# Patient Record
Sex: Female | Born: 1965 | Race: White | Hispanic: No | Smoking: Never smoker
Health system: Southern US, Community
[De-identification: ages and names within clinical notes are randomized; demographics above are authoritative.]

## PROBLEM LIST (undated history)

## (undated) DIAGNOSIS — F32A Depression, unspecified: Secondary | ICD-10-CM

## (undated) DIAGNOSIS — F909 Attention-deficit hyperactivity disorder, unspecified type: Secondary | ICD-10-CM

## (undated) DIAGNOSIS — F329 Major depressive disorder, single episode, unspecified: Secondary | ICD-10-CM

## (undated) DIAGNOSIS — F419 Anxiety disorder, unspecified: Secondary | ICD-10-CM

## (undated) DIAGNOSIS — E785 Hyperlipidemia, unspecified: Secondary | ICD-10-CM

## (undated) DIAGNOSIS — K8020 Calculus of gallbladder without cholecystitis without obstruction: Secondary | ICD-10-CM

## (undated) DIAGNOSIS — G971 Other reaction to spinal and lumbar puncture: Secondary | ICD-10-CM

## (undated) DIAGNOSIS — M545 Low back pain, unspecified: Secondary | ICD-10-CM

## (undated) DIAGNOSIS — G4700 Insomnia, unspecified: Secondary | ICD-10-CM

## (undated) DIAGNOSIS — F411 Generalized anxiety disorder: Secondary | ICD-10-CM

## (undated) DIAGNOSIS — J302 Other seasonal allergic rhinitis: Secondary | ICD-10-CM

## (undated) DIAGNOSIS — K219 Gastro-esophageal reflux disease without esophagitis: Secondary | ICD-10-CM

## (undated) DIAGNOSIS — G8929 Other chronic pain: Secondary | ICD-10-CM

## (undated) DIAGNOSIS — G4733 Obstructive sleep apnea (adult) (pediatric): Secondary | ICD-10-CM

## (undated) DIAGNOSIS — Z8619 Personal history of other infectious and parasitic diseases: Secondary | ICD-10-CM

## (undated) DIAGNOSIS — M1990 Unspecified osteoarthritis, unspecified site: Secondary | ICD-10-CM

## (undated) DIAGNOSIS — I10 Essential (primary) hypertension: Secondary | ICD-10-CM

## (undated) HISTORY — DX: Obstructive sleep apnea (adult) (pediatric): G47.33

## (undated) HISTORY — DX: Unspecified osteoarthritis, unspecified site: M19.90

## (undated) HISTORY — DX: Depression, unspecified: F32.A

## (undated) HISTORY — PX: JOINT REPLACEMENT: SHX530

## (undated) HISTORY — DX: Gastro-esophageal reflux disease without esophagitis: K21.9

## (undated) HISTORY — DX: Personal history of other infectious and parasitic diseases: Z86.19

## (undated) HISTORY — DX: Essential (primary) hypertension: I10

## (undated) HISTORY — PX: BACK SURGERY: SHX140

## (undated) HISTORY — DX: Hyperlipidemia, unspecified: E78.5

## (undated) HISTORY — DX: Other seasonal allergic rhinitis: J30.2

## (undated) HISTORY — DX: Major depressive disorder, single episode, unspecified: F32.9

## (undated) HISTORY — DX: Anxiety disorder, unspecified: F41.9

## (undated) HISTORY — DX: Insomnia, unspecified: G47.00

## (undated) HISTORY — DX: Calculus of gallbladder without cholecystitis without obstruction: K80.20

## (undated) HISTORY — PX: INTRAUTERINE DEVICE (IUD) INSERTION: SHX5877

---

## 1996-02-11 HISTORY — PX: WISDOM TOOTH EXTRACTION: SHX21

## 2000-03-17 ENCOUNTER — Other Ambulatory Visit: Admission: RE | Admit: 2000-03-17 | Discharge: 2000-03-17 | Payer: Self-pay | Admitting: Obstetrics and Gynecology

## 2001-01-18 ENCOUNTER — Observation Stay (HOSPITAL_COMMUNITY): Admission: AD | Admit: 2001-01-18 | Discharge: 2001-01-19 | Payer: Self-pay | Admitting: Obstetrics and Gynecology

## 2001-01-19 ENCOUNTER — Encounter: Payer: Self-pay | Admitting: Obstetrics and Gynecology

## 2001-01-21 ENCOUNTER — Inpatient Hospital Stay (HOSPITAL_COMMUNITY): Admission: AD | Admit: 2001-01-21 | Discharge: 2001-01-21 | Payer: Self-pay | Admitting: Obstetrics and Gynecology

## 2001-01-21 ENCOUNTER — Encounter: Payer: Self-pay | Admitting: Obstetrics and Gynecology

## 2001-01-22 ENCOUNTER — Encounter: Payer: Self-pay | Admitting: Obstetrics and Gynecology

## 2001-01-22 ENCOUNTER — Inpatient Hospital Stay (HOSPITAL_COMMUNITY): Admission: AD | Admit: 2001-01-22 | Discharge: 2001-01-22 | Payer: Self-pay | Admitting: Obstetrics and Gynecology

## 2001-01-25 ENCOUNTER — Inpatient Hospital Stay (HOSPITAL_COMMUNITY): Admission: AD | Admit: 2001-01-25 | Discharge: 2001-02-21 | Payer: Self-pay | Admitting: Obstetrics & Gynecology

## 2001-01-25 ENCOUNTER — Encounter (INDEPENDENT_AMBULATORY_CARE_PROVIDER_SITE_OTHER): Payer: Self-pay | Admitting: *Deleted

## 2001-01-26 ENCOUNTER — Encounter: Payer: Self-pay | Admitting: Obstetrics & Gynecology

## 2001-01-30 ENCOUNTER — Encounter: Payer: Self-pay | Admitting: Obstetrics and Gynecology

## 2001-02-01 ENCOUNTER — Encounter: Payer: Self-pay | Admitting: Obstetrics and Gynecology

## 2001-02-04 ENCOUNTER — Encounter: Payer: Self-pay | Admitting: Obstetrics and Gynecology

## 2001-02-08 ENCOUNTER — Encounter: Payer: Self-pay | Admitting: Obstetrics & Gynecology

## 2001-02-16 ENCOUNTER — Encounter: Payer: Self-pay | Admitting: Obstetrics and Gynecology

## 2001-02-22 ENCOUNTER — Encounter: Admission: RE | Admit: 2001-02-22 | Discharge: 2001-03-24 | Payer: Self-pay | Admitting: Obstetrics and Gynecology

## 2001-04-07 ENCOUNTER — Other Ambulatory Visit: Admission: RE | Admit: 2001-04-07 | Discharge: 2001-04-07 | Payer: Self-pay | Admitting: *Deleted

## 2002-05-02 ENCOUNTER — Other Ambulatory Visit: Admission: RE | Admit: 2002-05-02 | Discharge: 2002-05-02 | Payer: Self-pay | Admitting: Obstetrics and Gynecology

## 2003-07-19 ENCOUNTER — Other Ambulatory Visit: Admission: RE | Admit: 2003-07-19 | Discharge: 2003-07-19 | Payer: Self-pay | Admitting: Obstetrics and Gynecology

## 2004-08-26 ENCOUNTER — Other Ambulatory Visit: Admission: RE | Admit: 2004-08-26 | Discharge: 2004-08-26 | Payer: Self-pay | Admitting: Obstetrics and Gynecology

## 2006-01-13 ENCOUNTER — Ambulatory Visit: Payer: Self-pay | Admitting: Cardiology

## 2006-01-22 ENCOUNTER — Ambulatory Visit: Payer: Self-pay

## 2006-01-22 ENCOUNTER — Ambulatory Visit: Payer: Self-pay | Admitting: Cardiology

## 2006-01-22 ENCOUNTER — Encounter: Payer: Self-pay | Admitting: Cardiology

## 2006-02-04 ENCOUNTER — Ambulatory Visit: Payer: Self-pay | Admitting: Cardiology

## 2007-08-12 ENCOUNTER — Ambulatory Visit: Payer: Self-pay | Admitting: Nurse Practitioner

## 2007-09-23 ENCOUNTER — Ambulatory Visit: Payer: Self-pay | Admitting: Nurse Practitioner

## 2008-01-25 ENCOUNTER — Ambulatory Visit: Payer: Self-pay | Admitting: Nurse Practitioner

## 2008-08-01 ENCOUNTER — Ambulatory Visit: Payer: Self-pay | Admitting: Nurse Practitioner

## 2008-09-29 DIAGNOSIS — R51 Headache: Secondary | ICD-10-CM | POA: Insufficient documentation

## 2008-09-29 DIAGNOSIS — R519 Headache, unspecified: Secondary | ICD-10-CM | POA: Insufficient documentation

## 2008-09-29 DIAGNOSIS — E785 Hyperlipidemia, unspecified: Secondary | ICD-10-CM | POA: Insufficient documentation

## 2008-10-04 ENCOUNTER — Ambulatory Visit: Payer: Self-pay | Admitting: Cardiology

## 2008-10-04 DIAGNOSIS — I10 Essential (primary) hypertension: Secondary | ICD-10-CM | POA: Insufficient documentation

## 2008-10-09 ENCOUNTER — Encounter: Payer: Self-pay | Admitting: Cardiology

## 2008-10-10 LAB — CONVERTED CEMR LAB
ALT: 14 units/L (ref 0–35)
AST: 21 units/L (ref 0–37)
Albumin: 4.1 g/dL (ref 3.5–5.2)
Alkaline Phosphatase: 72 units/L (ref 39–117)
BUN: 12 mg/dL (ref 6–23)
Bilirubin, Direct: 0.1 mg/dL (ref 0.0–0.3)
CO2: 27 meq/L (ref 19–32)
Calcium: 9 mg/dL (ref 8.4–10.5)
Chloride: 107 meq/L (ref 96–112)
Cholesterol: 170 mg/dL (ref 0–200)
Creatinine, Ser: 0.8 mg/dL (ref 0.4–1.2)
GFR calc non Af Amer: 83.79 mL/min (ref >60)
Glucose, Bld: 96 mg/dL (ref 70–99)
HDL: 33.1 mg/dL — ABNORMAL LOW (ref >39.00)
LDL Cholesterol: 109 mg/dL — ABNORMAL HIGH (ref 0–99)
Potassium: 4 meq/L (ref 3.5–5.1)
Sodium: 139 meq/L (ref 135–145)
TSH: 0.97 microintl units/mL (ref 0.35–5.50)
Total Bilirubin: 0.8 mg/dL (ref 0.3–1.2)
Total CHOL/HDL Ratio: 5
Total Protein: 7 g/dL (ref 6.0–8.3)
Triglycerides: 138 mg/dL (ref 0.0–149.0)
VLDL: 27.6 mg/dL (ref 0.0–40.0)

## 2008-10-18 ENCOUNTER — Telehealth: Payer: Self-pay | Admitting: Cardiology

## 2008-12-26 ENCOUNTER — Telehealth: Payer: Self-pay | Admitting: Cardiology

## 2009-08-07 ENCOUNTER — Ambulatory Visit: Payer: Self-pay | Admitting: Nurse Practitioner

## 2009-11-22 ENCOUNTER — Ambulatory Visit: Payer: Self-pay | Admitting: Cardiology

## 2009-12-03 ENCOUNTER — Ambulatory Visit: Payer: Self-pay | Admitting: Cardiology

## 2009-12-10 LAB — CONVERTED CEMR LAB
ALT: 13 units/L (ref 0–35)
AST: 17 units/L (ref 0–37)
Albumin: 4.1 g/dL (ref 3.5–5.2)
Alkaline Phosphatase: 68 units/L (ref 39–117)
BUN: 14 mg/dL (ref 6–23)
Bilirubin, Direct: 0.1 mg/dL (ref 0.0–0.3)
CO2: 25 meq/L (ref 19–32)
Calcium: 9 mg/dL (ref 8.4–10.5)
Chloride: 102 meq/L (ref 96–112)
Cholesterol: 196 mg/dL (ref 0–200)
Creatinine, Ser: 0.7 mg/dL (ref 0.4–1.2)
GFR calc non Af Amer: 97.21 mL/min (ref >60)
Glucose, Bld: 83 mg/dL (ref 70–99)
HDL: 49.2 mg/dL (ref >39.00)
LDL Cholesterol: 125 mg/dL — ABNORMAL HIGH (ref 0–99)
Potassium: 4 meq/L (ref 3.5–5.1)
Sodium: 136 meq/L (ref 135–145)
TSH: 0.83 microintl units/mL (ref 0.35–5.50)
Total Bilirubin: 1 mg/dL (ref 0.3–1.2)
Total CHOL/HDL Ratio: 4
Total Protein: 6.8 g/dL (ref 6.0–8.3)
Triglycerides: 108 mg/dL (ref 0.0–149.0)
VLDL: 21.6 mg/dL (ref 0.0–40.0)

## 2010-03-12 NOTE — Assessment & Plan Note (Signed)
Summary: f1y   Visit Type:  1 yr f/u Primary Provider:  NO PCP AT THIS TIME  CC:  pt states she still does not have a PCP told her she could check w/[facility].....chest tightness on and off....denies any other complaints today.  History of Present Illness: Ms Musil returns today for evaluation and management of her hypertension, mild obesity, and mixed hyperlipidemia.  She's lost a total of about 30 pounds. She exercises on a regular basis.   Her complaint today is that she gets a little bit lightheaded sometimes when he stands up too quickly. She denies any syncope.  She's had no chest discomfort consistent with angina or ischemia.  Her lipid panel last year showed a total cholesterol 170, triglycerides 138, HDL 33, LDL 109. Her fasting blood sugar was normal, TSH was normal and LFTs were normal.  Current Medications (verified): 1)  Ambien 10 Mg Tabs (Zolpidem Tartrate) .Marland Kitchen.. 1 Tab At Bedtime 2)  Imitrex 100 Mg Tabs (Sumatriptan Succinate) .... As Needed 3)  Labetalol Hcl 100 Mg Tabs (Labetalol Hcl) .... Take One Tablet By Mouth Twice A Day 4)  Multivitamins   Tabs (Multiple Vitamin) .Marland Kitchen.. 1 Tab Once Daily 5)  Alprazolam 0.5 Mg Tabs (Alprazolam) .... As Needed 6)  Cyclobenzaprine Hcl 10 Mg Tabs (Cyclobenzaprine Hcl) .... 1/2 - 1 Tab As Needed 7)  Fluticasone Propionate 50 Mcg/act Susp (Fluticasone Propionate) .... Use As Directed As Needed 8)  Tums 500 Mg Chew (Calcium Carbonate Antacid) .... As Needed  Allergies (verified): No Known Drug Allergies  Past History:  Past Medical History: Last updated: 10/17/2008 CORONARY ARTERY DISEASE, FAMILY HX (ICD-V17.3) HYPERLIPIDEMIA (ICD-272.4) HEADACHE (ICD-784.0)  Past Surgical History: Last updated: 17-Oct-2008 Cesarean section...2003 Wisdom teeth extracted..1997  Family History: Last updated: 10/17/2008 Father: MI deceased at 66 Mother: alive and smokes Siblings: 1 sister alive and healthy  Social History: Last  updated: 10/17/2008 Married  Regular Exercise - yes Drug Use - no Tobacco Use - Former.  Alcohol Use - yes  Risk Factors: Exercise: yes (10/17/08)  Risk Factors: Smoking Status: quit (10-17-2008)  Review of Systems       negative other than history of present illness  Vital Signs:  Patient profile:   45 year old female Height:      64 inches Weight:      167.8 pounds BMI:     28.91 Pulse rate:   74 / minute Pulse rhythm:   irregular BP sitting:   120 / 70  (left arm) Cuff size:   large  Vitals Entered By: Danielle Rankin, CMA (November 22, 2009 11:47 AM)  Physical Exam  General:  mildly overweight, in no acute distress Head:  normocephalic and atraumatic Eyes:  PERRLA/EOM intact; conjunctiva and lids normal. Neck:  Neck supple, no JVD. No masses, thyromegaly or abnormal cervical nodes. Lungs:  Clear bilaterally to auscultation and percussion. Heart:  PMI not displaced, normal S1-S2, no murmur or gallop. Carotids are full without bruits Abdomen:  Bowel sounds positive; abdomen soft and non-tender without masses, organomegaly, or hernias noted. No hepatosplenomegaly. Msk:  Back normal, normal gait. Muscle strength and tone normal. Pulses:  pulses normal in all 4 extremities Extremities:  No clubbing or cyanosis. Neurologic:  Alert and oriented x 3. Skin:  Intact without lesions or rashes. Psych:  Normal affect.   EKG  Procedure date:  11/22/2009  Findings:      normal sinus rhythm, nonspecific T wave changes,  Impression & Recommendations:  Problem # 1:  HYPERTENSION, BENIGN (ICD-401.1) Assessment Improved  Her updated medication list for this problem includes:    Labetalol Hcl 100 Mg Tabs (Labetalol hcl) .Marland Kitchen... Take one tablet by mouth twice a day  Orders: EKG w/ Interpretation (93000)  Problem # 2:  CORONARY ARTERY DISEASE, FAMILY HX (ICD-V17.3) Assessment: Unchanged  Problem # 3:  HYPERLIPIDEMIA (ICD-272.4) Will repeat fasting lipids. Perhaps her HDL  is increased with her weight loss and exercise. Some of this may just be genetic as I explained to her today.  Patient Instructions: 1)  Your physician recommends that you schedule a follow-up appointment in: 12 months 2)  Your physician recommends that you return for fasting lab work next week. 3)  Your physician recommends that you continue on your current medications as directed. Please refer to the Current Medication list given to you today.

## 2010-04-02 ENCOUNTER — Encounter: Payer: BC Managed Care – PPO | Admitting: Nurse Practitioner

## 2010-04-02 DIAGNOSIS — G43109 Migraine with aura, not intractable, without status migrainosus: Secondary | ICD-10-CM

## 2010-04-02 DIAGNOSIS — M542 Cervicalgia: Secondary | ICD-10-CM

## 2010-04-15 ENCOUNTER — Other Ambulatory Visit: Payer: Self-pay | Admitting: Obstetrics & Gynecology

## 2010-04-15 DIAGNOSIS — M542 Cervicalgia: Secondary | ICD-10-CM

## 2010-04-16 ENCOUNTER — Encounter: Payer: BC Managed Care – PPO | Admitting: Obstetrics and Gynecology

## 2010-04-17 ENCOUNTER — Other Ambulatory Visit: Payer: BC Managed Care – PPO

## 2010-04-17 ENCOUNTER — Encounter (INDEPENDENT_AMBULATORY_CARE_PROVIDER_SITE_OTHER): Payer: Self-pay | Admitting: *Deleted

## 2010-04-17 DIAGNOSIS — I10 Essential (primary) hypertension: Secondary | ICD-10-CM

## 2010-04-17 LAB — CONVERTED CEMR LAB: BUN: 10 mg/dL (ref 6–23)

## 2010-04-22 ENCOUNTER — Ambulatory Visit (HOSPITAL_COMMUNITY)
Admission: RE | Admit: 2010-04-22 | Discharge: 2010-04-22 | Disposition: A | Discharge status: Home / Self Care | Payer: BC Managed Care – PPO | Source: Ambulatory Visit | Attending: Obstetrics & Gynecology | Admitting: Obstetrics & Gynecology

## 2010-04-22 DIAGNOSIS — M542 Cervicalgia: Secondary | ICD-10-CM

## 2010-04-22 DIAGNOSIS — M5020 Other cervical disc displacement, unspecified cervical region: Secondary | ICD-10-CM | POA: Insufficient documentation

## 2010-05-02 ENCOUNTER — Ambulatory Visit (HOSPITAL_COMMUNITY)
Admission: RE | Admit: 2010-05-02 | Discharge: 2010-05-02 | Disposition: A | Discharge status: Home / Self Care | Payer: BC Managed Care – PPO | Source: Ambulatory Visit | Attending: Neurosurgery | Admitting: Neurosurgery

## 2010-05-02 ENCOUNTER — Other Ambulatory Visit (HOSPITAL_COMMUNITY): Payer: Self-pay | Admitting: Neurosurgery

## 2010-05-02 ENCOUNTER — Encounter (HOSPITAL_COMMUNITY)
Admission: RE | Admit: 2010-05-02 | Discharge: 2010-05-02 | Disposition: A | Discharge status: Home / Self Care | Payer: BC Managed Care – PPO | Source: Ambulatory Visit | Attending: Neurosurgery | Admitting: Neurosurgery

## 2010-05-02 DIAGNOSIS — M4712 Other spondylosis with myelopathy, cervical region: Secondary | ICD-10-CM | POA: Insufficient documentation

## 2010-05-02 DIAGNOSIS — Z01818 Encounter for other preprocedural examination: Secondary | ICD-10-CM | POA: Insufficient documentation

## 2010-05-02 DIAGNOSIS — Z01810 Encounter for preprocedural cardiovascular examination: Secondary | ICD-10-CM | POA: Insufficient documentation

## 2010-05-02 DIAGNOSIS — G4730 Sleep apnea, unspecified: Secondary | ICD-10-CM | POA: Insufficient documentation

## 2010-05-02 DIAGNOSIS — I10 Essential (primary) hypertension: Secondary | ICD-10-CM | POA: Insufficient documentation

## 2010-05-02 DIAGNOSIS — Z01812 Encounter for preprocedural laboratory examination: Secondary | ICD-10-CM | POA: Insufficient documentation

## 2010-05-02 LAB — BASIC METABOLIC PANEL
CO2: 28 mEq/L (ref 19–32)
Calcium: 9.3 mg/dL (ref 8.4–10.5)
Creatinine, Ser: 0.8 mg/dL (ref 0.4–1.2)
GFR calc Af Amer: >60 mL/min (ref >60)
GFR calc non Af Amer: >60 mL/min (ref >60)
Glucose, Bld: 82 mg/dL (ref 70–99)

## 2010-05-02 LAB — CBC
Hemoglobin: 13.3 g/dL (ref 12.0–15.0)
MCH: 28.5 pg (ref 26.0–34.0)
MCHC: 33.8 g/dL (ref 30.0–36.0)
RDW: 12.8 % (ref 11.5–15.5)

## 2010-05-02 LAB — SURGICAL PCR SCREEN: MRSA, PCR: NEGATIVE

## 2010-05-03 ENCOUNTER — Ambulatory Visit (HOSPITAL_COMMUNITY)
Admission: RE | Admit: 2010-05-03 | Discharge: 2010-05-04 | DRG: 864 | Disposition: A | Discharge status: Home / Self Care | Payer: BC Managed Care – PPO | Source: Ambulatory Visit | Attending: Neurosurgery | Admitting: Neurosurgery

## 2010-05-03 ENCOUNTER — Ambulatory Visit (HOSPITAL_COMMUNITY): Payer: BC Managed Care – PPO

## 2010-05-03 DIAGNOSIS — M5000 Cervical disc disorder with myelopathy, unspecified cervical region: Secondary | ICD-10-CM | POA: Insufficient documentation

## 2010-05-03 DIAGNOSIS — M4712 Other spondylosis with myelopathy, cervical region: Secondary | ICD-10-CM | POA: Insufficient documentation

## 2010-05-03 DIAGNOSIS — I10 Essential (primary) hypertension: Secondary | ICD-10-CM | POA: Insufficient documentation

## 2010-05-03 DIAGNOSIS — Z01818 Encounter for other preprocedural examination: Secondary | ICD-10-CM | POA: Insufficient documentation

## 2010-05-03 DIAGNOSIS — Z01812 Encounter for preprocedural laboratory examination: Secondary | ICD-10-CM | POA: Insufficient documentation

## 2010-05-03 HISTORY — PX: ANTERIOR CERVICAL DECOMP/DISCECTOMY FUSION: SHX1161

## 2010-05-03 NOTE — Assessment & Plan Note (Unsigned)
NAME:  Shelly Brown, Shelly Brown NO.:  [id number]  MEDICAL RECORD NO.:  [id number]          PATIENT TYPE:  LOCATION:  [facility]           FACILITY:  PHYSICIAN:  Jaynie Collins, MD          DATE OF BIRTH:  DATE OF SERVICE:  04/02/2010                                 CLINIC NOTE  The patient comes to office today for followup on her migraine headaches.  The patient was last seen in June 2011.  Since then, there has been some confusion about her starting the Cymbalta.  Originally, [employer] was not paying for it.  She had been taking Xanax daily.  She has been weaned off that and has been on the Cymbalta for the last 3 weeks at 30 mg.  She has been taking it at night which is causing her to have some mild difficulty with her sleep.  She has also regained some of her weight as she is up 15 pounds.  She has not been exercising much, just 1 day per week doing a knockout class.  She has been going to the chiropractor which has not been particularly helpful. Her neck continues to bother her and has been bothering her for over 1 year.  She originally noticed that the pain started when she was washing her hair 1 day.  She is now feeling that she is having some tingling in her fingers.  She has been taking the Flexeril on a p.r.n. basis only. 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  PHYSICAL EXAMINATION:  GENERAL:  A well-developed, well-nourished 45- year-old Caucasian female, in no acute distress. VITAL SIGNS:  Blood pressure is 119/77, pulse is 82, weight is 181. NEUROLOGIC:  The patient is alert, oriented.  She is appropriate in her affect.  Her speech is appropriate.  She does not have any flight of ideas.  She has good muscle tone and good coordination and good sensation. EXTREMITIES:  Warm and dry.  ASSESSMENT: 1. Migraine headache. 2. Ongoing muscle spasm in her  neck.  PLAN:  We will go up on her dose of Cymbalta to 60 mg.  She is given a prescription for #30 with p.r.n. refills.  We will also order an MRI of her neck.  We discussed her stopping the type of exercise that she is doing and going more toward the water aerobic-type exercise that she might be able to do almost daily.  She is also given lidocaine patches and described at length how to use these.  She is given one box with p.r.n. refills.  She will also have a refill on her Ambien 10 mg one- half tablet p.o. nightly #30 with five refills.     Remonia Richter, NP   ______________________________ Jaynie Collins, MD  LR/MEDQ  D:  04/02/2010  T:  04/03/2010  Job:  [id number]

## 2010-05-08 NOTE — Op Note (Signed)
NAME:  Shelly Brown, Shelly Brown                ACCOUNT NO.:  [id number]  MEDICAL RECORD NO.:  [id number]           PATIENT TYPE:  O  LOCATION:  XRAY                         FACILITY:  [facility]  PHYSICIAN:  Coletta Memos, M.D.     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 cervical radiculopathy, cervical stenosis.  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 cervical radiculopathy, cervical stenosis.  PROCEDURE: 1. C6 corpectomy with microdissection. 2. Arthrodesis C5-C7 with PEEK interbody cage with morselized     autograft. 3. Anterior instrumentation with vector T plating.  COMPLICATIONS:  None.  SURGEON:  Coletta Memos, MD  ASSISTANT:  Clydene Fake, MD  ANESTHESIA:  General endotracheal.  INDICATIONS:  Shelly Brown presented to my office on March 20 with evidence of myelopathy on examination.  MRI showed a large disk herniation at C6-7 which had migrated rostrally behind the body of C6. She had high signal in the spinal cord and severe cervical stenosis.  I recommended strongly to Shelly Brown to get her neck decompressed as soon as possible and she consents to this Friday.  OPERATIVE NOTE:  Shelly Brown was brought to the operating room, intubated and placed under a general anesthetic.  Her head was placed on horseshoe headrest and essentially neutral position.  Her neck was prepped and she was draped in a sterile fashion.  I infiltrated 3 mL, 0.5% lidocaine, 1:200,000 epinephrine and the skin starting from the midline extending to the medial border of the left sternocleidomastoid at the level of the cricothyroid membrane.  I opened the skin with a #10 blade and took this down to the platysma.  I dissected with Metzenbaum  scissors rostrally and caudally in a plane superficial to the platysma.  I opened the platysma in a horizontal fashion.  Using the Metzenbaum scissors, then dissected in a plane inferior to the platysma rostrally and caudally. With both sharp and blunt technique, I created an avascular corridor with the medial straps being reflected medially and sternocleidomastoid, carotid artery, jugular vein being reflected laterally.  I was able to then expose the cervical spine.  I placed spinal needle for localization, took an x-ray, and confirmed our location at C5-6.  I then reflected the longus colli muscles bilaterally placing self-retaining retractors over the disk spaces at C5-6 and C6-7.  I opened both disk spaces.  I decompressed the spinal canal for corpectomy at C6 first by opening the disk spaces at C5-6 and C6-7 with a #15 blade.  I then removed disk material with pituitary rongeurs and curettes.  I used a Gaffer to remove a good portion of the C6 retrieval body and I saved that bone for grafting later.  I also used a drill to complete the corpectomy along with Kerrison punches.  I was able to fully expose the thecal sac and spinal canal from C5-C7 with corpectomy.  I then decompressed the C6 roots and  C7 roots bilaterally using the drill and Kerrison punch with microdissection.  With thorough decompression, I then prepared for the arthrodesis.  With doctor's assistance, we placed a PEEK interbody implant packed with morselized autograft.  I used a 22-mm graft after measuring the space. That felt secure.  I placed anterior instrumentation using a vector T translational plate, placing 2 screws in C5, 2 screws in C7.  This was done with Dr. Doreen Beam assistance.  X-ray showed that we were at the correct level.  I then made sure that there was good hemostasis.  I irrigated copiously.  I closed the wound in layered fashion using Vicryl sutures to reapproximate the platysma and  subcuticular layer.  I used Dermabond for sterile dressing.  Shelly Brown was then extubated moving all extremities well.          ______________________________ Coletta Memos, M.D.     KC/MEDQ  D:  05/03/2010  T:  05/04/2010  Job:  [id number]  Electronically Signed by Coletta Memos M.D. on 05/08/2010 03:45:50 PM

## 2010-06-25 NOTE — Assessment & Plan Note (Signed)
NAME:  Shelly Brown, Shelly Brown NO.:  [id number]   MEDICAL RECORD NO.:  [id number]          PATIENT TYPE:  POB   LOCATION:  [facility]         FACILITY:  [facility]   PHYSICIAN:  Elsie Lincoln, MD      DATE OF BIRTH:  1965/06/22   DATE OF SERVICE:  08/12/2007                                  CLINIC NOTE   The patient comes to the office today for headache consultation.  The  patient is well known to me for many [facility]  She has been seeing Anne Fu there and we will get her  copies of her medical records.  She has I last her seen been diagnosed  with a mild sleep apnea.  She currently feels that she is in a bad  headache place.  She is having approximately 15 days per month with  migraines.  She has approximately 5 severe, 5 moderate, and 5 mild.  She  has not been taking her Imitrex recently.  She has not been taking any  of her prevention.  She has been using Ambien 10 mg up to 20 mg at night  for sleep.  She is currently experiencing some stressors including  working 42 job, a 33-year-old, and husband who is equally busy.   VITAL SIGNS:  Blood pressure is 123/89, weight is 185, and pulse is 73.   ALLERGIES:  None.   CURRENT MEDICATIONS:  Ambien and labetalol.   MENSTRUAL HISTORY:  Regular menstrual cycle.   OBSTETRICAL HISTORY:  G1, P1, last Pap smear was in February 2009.   SURGICAL HISTORY:  C-section only.   PERSONAL HISTORY:  1. Arthritis.  2. High cholesterol.  3. High blood pressure.  4. Migraines.  5. Back pain.   SOCIAL HISTORY:  The patient works outside the home.  She does not smoke  or drink.   REVIEW OF SYSTEMS:  Positive for muscle aches, fatigue, frequent  headaches, problems with breathing, and chest pain.   PHYSICAL EXAMINATION:  GENERAL:  A well-developed and well-nourished 45-  year-old Caucasian female, in no acute distress.  The patient is  overweight.  HEENT:  Head is normocephalic and  atraumatic.  Pupils are equal and  react to light.  CARDIAC:  Regular rate and rhythm.  No murmurs, rubs, or thrills.  LUNGS:  Clear bilaterally.  NEUROLOGIC:  The patient is alert and oriented.  She does not have any  flight of ideas or irregular speech.  Her cranial nerves appear to be  intact.  She has good muscle coordination and good sensation.   ASSESSMENT/PLAN:  Migraine headaches.  We will use Frova 2.5 mg 1 p.o.  q.a.m., #9 with 3 refills to break her headache cycle.  She will also  restart on her Effexor 75 mg XR 1 p.o. daily, #30 with 3 refills as well  as Xanax 0.5 mg 1 p.o. b.i.d., #30 with no refills.  She will use all of  this to help break her current headache cycle.  She will continue on her  Ambien 10 mg 1 p.o. nightly, #30 with 5 refills for sleep at night.  She  is  encouraged not to use 2 at night as that may be contributing to her  headache.  She will have her Imitrex refilled 100 mg 1 p.o. p.r.n.  migraine, #9 with p.r.n. refills to be used once her headache cycle is  broken.  She will return to see me in 6 weeks.      Remonia Richter, NP    ______________________________  Elsie Lincoln, MD    LR/MEDQ  D:  08/12/2007  T:  08/13/2007  Job:  [id number]

## 2010-06-25 NOTE — Assessment & Plan Note (Signed)
NAME:  ROSENDA, Shelly Brown NO.:  [id number]   MEDICAL RECORD NO.:  [id number]          PATIENT TYPE:  POB   LOCATION:  [facility]         FACILITY:  [facility]   PHYSICIAN:  Allie Bossier, MD        DATE OF BIRTH:  10-Sep-1965   DATE OF SERVICE:  08/07/2009                                  CLINIC NOTE   The patient comes to office today for followup on her migraine  headaches.  She was seen at this time last year.  Since then she has  been doing very well.  She has continued to lose weight and is down 20  pounds.  She feels overall that her headaches are better that they are  less migraine now and more tension.  She feels that the tension often  comes from her neck and she would like to have a muscle relaxer.  She  has had low back problems in the past.  She was seen by an orthopedist  in the past.  Now she takes Xanax and that helps.  She did see her  cardiologist.  Her checkup was good.  Her cholesterol was good.  She has  continued to not work a full-time job.  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  She is having some  problems with her allergies.  She has been taking over-the-counter  Allegra-D.  She is willing to see an allergist.   PHYSICAL EXAMINATION:  GENERAL:  Well-developed, well-nourished, 44-year-  old Caucasian female in no acute distress.  VITAL SIGNS:  Blood pressure is 125/85, pulse is 76, weight is 166,  height is 5 feet 4 inches.  HEENT:  Head is normocephalic and atraumatic.  Pupils equal and react.  The patient definitely has nasal congestion and stuffiness.  NEUROLOGICAL:  Alert, oriented.  No neurological deficits.   ASSESSMENT:  1. Migraine.  2. Allergies.  3. Neck pain.   The patient will continue along with her usual migraine medicines.  Her  insurance company pain for her Cymbalta.  She has tapered herself off of  that.  She will see the allergist at [facility] and use Flonase 2 sprays at  bedtime.  She is given a prescription for one bottle with p.r.n.  refills.  She is also given a prescription for Flexeril 10 mg 1/2 to 1  tablet at bedtime #30 with 3 refills.  We may consider physical therapy,  lidocaine patches, and/or MRI if things do not improve.  She will call  the office if she is having any difficulties, otherwise she will follow  up in 1 year.      Shelly Richter, NP    ______________________________  Allie Bossier, MD    LR/MEDQ  D:  08/07/2009  T:  08/08/2009  Job:  [id number]

## 2010-06-25 NOTE — Assessment & Plan Note (Signed)
NAME:  Shelly Brown, Shelly Brown NO.:  [id number]   MEDICAL RECORD NO.:  [id number]          PATIENT TYPE:  POB   LOCATION:  [facility]         [facility]   PHYSICIAN:  Elsie Lincoln, MD      DATE OF BIRTH:  12-02-1965   DATE OF SERVICE:  08/01/2008                                  CLINIC NOTE   The patient comes to the office today for followup on her headaches.  The patient is doing very well with her headaches overall.  She was  given the option of taking a full-time job versus quitting her job and  she has opted to quit her job to keep her stress levels at a minimum.  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  She has  joined the [employer] and has lost 12 pounds and is doing very well with  that.  She is having a particularly bad time with her CPAP machine right  now for her sleep apnea.  She has allergies to GRASS, has really not  been taking her Zyrtec as she should, has not been taking her Rhinocort  at all and as a result her nose is very stuffy and she is unable to use  her CPAP machine now.   PHYSICAL EXAMINATION:  VITAL SIGNS:  Blood pressure is 132/82, pulse is  80, weight is 184, height is 5 feet 4 inches.  GENERAL:  A well-developed, well-nourished 45 year old Caucasian female  in no acute distress.  The patient does have obvious nasal stuffiness.  CARDIAC:  Regular rate and rhythm.  LUNGS:  Clear bilaterally.   ASSESSMENT:  1. Migraine.  2. Insomnia.  3. Sleep apnea.  4. Anxiety.   PLAN:  We have decided to do a prednisone taper, prednisone 20 mg 4 tabs  x1 day, 3 tabs x1 day, 2 tabs x1 day, 1 tab x1 day.  This should take  her back to baseline with her allergies.  The patient is encouraged to  take her Zyrtec daily as well as use her Rhinocort daily and use her  CPAP as best that she can every night.  The patient has continued weight  loss goals.  She would also like to have a refill on her Imitrex 100 mg  1 p.o. p.r.n.  migraine #9 with p.r.n. refills and her Cymbalta 30 mg 1  p.o. daily #30 with p.r.n. refills.  She would also like to have a  refill on her Ambien 10 mg 1 p.o. p.r.n. insomnia, #30 with 5 refills  and her Xanax 0.5 mg 1 p.o. p.r.n. q.6 h. #30 with 1  refill.  The patient is given the option of returning in 6 months or 1  year.  She will choose the 1 year.  In the event that she does not do  well, then she will come back sooner.  Otherwise, we will see her back  in 1 year.      Remonia Richter, NP    ______________________________  Elsie Lincoln, MD    LR/MEDQ  D:  08/01/2008  T:  08/02/2008  Job:  [id number]

## 2010-06-25 NOTE — Assessment & Plan Note (Signed)
NAME:  Shelly Brown, ROTUNNO NO.:  [id number]   MEDICAL RECORD NO.:  [id number]          PATIENT TYPE:  POB   LOCATION:  [facility]         FACILITY:  [facility]   PHYSICIAN:  Argentina Donovan, MD        DATE OF BIRTH:  1965-11-27   DATE OF SERVICE:  01/25/2008                                  CLINIC NOTE   The patient comes to office today for followup on her migraine  headaches.  She is doing extremely well.  Her headaches are down to  approximately 2-3 severe in the last 2 months.  No moderates and a very  few milds.  She is got her job back to part-time, as she feels that this  has been very helpful for her.  She has recognized that she has the  weights as a trigger for her headaches and we have spent some time  talking about that today.  Things at home and work are much better.  In  fact today she is basically just getting her medications refilled.   PHYSICAL EXAMINATION:  VITAL SIGNS:  Blood pressure is 137/89, pulse is  95, weight is 196, height is 5 feet 4.  GENERAL:  Well-developed, well-nourished 45 year old Caucasian female in  no acute distress.  CARDIAC:  Regular rate and rhythm.  LUNGS:  Clear bilaterally.   ASSESSMENT:  Migraine headache, anxiety, and insomnia.   PLAN:  The patient will have her Imitrex refilled 100 mg 1 p.o. p.r.n.  migraine #27 with 1 refill.  She does take occasional Xanax 0.5 mg 1  p.o. p.r.n. #30 with 1 refill.  She can have her Ambien refilled 10 mg 1  p.o. at bedtime p.r.n. #30 with 5 refills.  We did have a lengthy  discussion about losing some weight.  She has in fact going to gym  yesterday and has a goal to lose 10 pounds for her next office visit.      Remonia Richter, NP    ______________________________  Argentina Donovan, MD    LR/MEDQ  D:  01/25/2008  T:  01/26/2008  Job:  [id number]

## 2010-06-25 NOTE — Assessment & Plan Note (Signed)
NAME:  Shelly Brown, Shelly Brown NO.:  [id number]   MEDICAL RECORD NO.:  [id number]          PATIENT TYPE:  POB   LOCATION:  [facility]         [facility]ACILITY:  [facility]   PHYSICIAN:  Elsie Lincoln, MD      DATE OF BIRTH:  30-Oct-1965   DATE OF SERVICE:  09/23/2007                                  CLINIC NOTE   The patient comes to the office today for followup on her migraine  headaches.  She at her last visit was in a very bad headache cycle, and  our intention was to break her headache cycle.  Her insurance company  would not allow her to use the Effexor, so we did change that to  Cymbalta.  She has currently been on Cymbalta 30 mg for approximately 1  month.  She does feel that that has been helpful.  She does feel that  her headache cycle has been broken by several things that we did and  that included further taper the Cymbalta.  She also used the Xanax  almost daily for 1 month.  She also changed some life events including  dropping her job to part time.  All of those things have been helpful  for her.  Last month, she was down to 3 severe headaches, 2 moderates,  and 1 mild.  She did take the Imitrex and felt that that was helpful.  We did discuss sleep hygiene again today and she has been taking the  Ambien at 10 mg.  She still has some difficulty with sleep.  We did  discuss going up on the Cymbalta and she feels that that would be  helpful for her.   PHYSICAL EXAMINATION:  GENERAL:  Well-developed, well-nourished 45-year-  old Caucasian female in no acute distress.  VITAL SIGNS:  Blood pressure is 127/78, pulse is 68, and weight is  183.5.  HEENT:  Head is normocephalic and atraumatic.  Pupils are equal and  react.  NEURO:  The patient is alert and oriented.  Normal muscle coordination  and sensation.  CARDIAC:  Regular rate and rhythm.  LUNGS:  Clear bilaterally.   ASSESSMENT AND PLAN:  1. Migraine.  2. Anxiety.  3. Insomnia.   PLAN:  We will go up on  her Cymbalta to 60 mg 1 p.o. daily, #90 with 2  refills.  She is also requesting a refill on her Xanax.  We will refill  her Xanax 0.5 mg 1 p.o. p.r.n. anxiety, #30 with 1 refill.  The patient  is aware that this must last her for the next 6 months until she  returns.  She is doing a very good job and has been applauded for taking  control of her life and decreasing her stressors.  We will see her back  in the office in 6 months.     Remonia Richter, NP    ______________________________  Elsie Lincoln, MD   LR/MEDQ  D:  09/23/2007  T:  09/23/2007  Job:  [id number]

## 2010-06-28 NOTE — Assessment & Plan Note (Signed)
[facility]                            CARDIOLOGY OFFICE NOTE   NAME:CLONTZChenel, Wernli                         MRN:          [id number]  DATE:01/13/2006                            DOB:          Aug 04, 1965    CHIEF COMPLAINT:  My father had a heart attack in his early 31s.   HISTORY OF PRESENT ILLNESS:  Ms. Taiz Bickle is a delightful 45-year-  old married white female who has a family history of coronary disease.  Her father had an MI had age 23 and died. He did smoke but he quit prior  to that. She does not know about any of his other risk factors. Her  mother is 32 and smokes and is without cardiovascular disease. She has  one sister 56 who is healthy and without disease.   She does have cardiac risk factors of hypertension. This was diagnosed  about 5 years ago when she was preeclamptic. She was on atenolol before  that and now is on labetalol with good control. She checks it with a  home cuff.   She does not smoke, does not binge drink, does not use any recreational  drugs, has no history of glucose intolerance. She had her cholesterol  checked about a year ago and was told that it was borderline high. She  does not know the numbers.   She does walk but has a lot of problems with her knees. She is only  walking about an hour week.   PAST MEDICAL HISTORY:  In addition to the above, she has no known drug  allergies. She has no dye allergy.   MEDICATIONS:  1. Ambien 10 mg p.o. q.h.s.  2. Imitrex 100 mg p.r.n. for migraines.  3. Vicodin 7.5/750 p.r.n. for migraines.  4. Topamax 200 mg daily.  5. Effexor XL 75 mg a day.  6. Tizanidine 4 mg p.r.n.  7. Labetalol 100 mg daily.   PAST SURGICAL HISTORY:  She had a C section in 2003. She had her wisdom  teeth out in 1997.   FAMILY HISTORY:  Above.   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  She is married and has 1 child.   REVIEW OF SYSTEMS:  Negative other than  HPI for anything pertinent.  Specifically, she has no orthopnea, PND, peripheral edema. She does have  some mild dyspnea on exertion but thinks this is due to deconditioning.  She has had no chest pain or ischemic symptoms.   PHYSICAL EXAMINATION:  GENERAL:  She is very pleasant.  SKIN:  Warm and dry. Intact.  VITAL SIGNS:  She is 5 foot 4, weighs 172 pounds. Her blood pressure is  100/70, her pulse is 75 and regular.  HEENT:  Normocephalic, atraumatic. PERRLA. Extraocular movements intact.  Sclera clear. Facial symmetry is normal, dentition is satisfactory.  Carotid upstrokes are equal and bilateral without bruits. No JVD,  thyroid is not enlarged, trachea is midline.  LUNGS:  Clear to auscultation.  HEART:  Reveals a normal S1, S2 with a nondisplaced PMI. She has  no  murmurs, rubs or gallops.  ABDOMEN:  Soft with good bowel sounds. No midline bruit. There is no  hepatomegaly.  EXTREMITIES:  Reveal no cyanosis, clubbing or edema. Pulses are intact.  NEUROLOGIC:  Intact.  MUSCULOSKELETAL:  Intact.   EKG is normal except for some nonspecific ST segment flattening  laterally.   ASSESSMENT/PLAN:  I spent about 20 minutes talking to Ms. Ribas about  primary risk prevention. Her blood pressure is under excellent control.  She does need to increase her exercise to something her knees will  tolerate for about 3 hours per week. In addition, I have asked her to  return for a fasting lipid panel and liver panel. Will also a do a 2-D  echo that day to assess for any left ventricular hypertrophy or left  ventricular dysfunction with her dyspnea on exertion.   Assuming this is all stable, will plan on seeing her back on a p.r.n.  basis.     Thomas C. Daleen Squibb, MD, [facility]  Electronically Signed    TCW/MedQ  DD: 01/13/2006  DT: 01/14/2006  Job #: [id number]   cc:   Juluis Mire, M.D.

## 2010-06-28 NOTE — Op Note (Signed)
[facility]  Patient:    Shelly Brown, Shelly Brown Visit Number: [id number] MRN: [id number]          Service Type: OBS Location: 9400 [id number] Attending Physician:  Minette Headland Dictated by:   Juluis Mire, M.D. Proc. Date: 02/17/01 Admit Date:  01/25/2001                             Operative Report  PREOPERATIVE DIAGNOSES:       Intrauterine pregnancy at 34 weeks with severe toxemia, mature fetal pulmonary studies by amniocentesis, desires primary cesarean section.  POSTOPERATIVE DIAGNOSES:      Intrauterine pregnancy at 34 weeks with severe toxemia, mature fetal pulmonary studies by amniocentesis, desires primary cesarean section.  PROCEDURE:                    Primary low transverse cesarean section.  SURGEON:                      Juluis Mire, M.D.  ANESTHESIA:                   Spinal.  ESTIMATED BLOOD LOSS:         800 cc.  PACKS AND DRAINS:             None.  INTRAOPERATIVE BLOOD:         None.  COMPLICATIONS:                None.  INDICATIONS:                  A 45 year old primigravida married white female was admitted to the hospital on December 16 at which time she was approximately 31 weeks.  The patient had some mildly elevated blood pressures in the office.  Subsequently, they started to increase with increasing proteinuria.  Patient had been on labetalol for this.  We felt that she probably had a chronic hypertension with superimposed preeclampsia and was admitted.  During her hospitalization she had excessive proteinuria.  There were no other signs of worsening toxemia.  Because of the proteinuria she was maintained on hospital monitoring.  At 34 weeks with did an amniocentesis with LS of 2.9:1 with PG.  We had offered a trial of labor through induction which she declined.  Desires primary cesarean section.  The risks of surgery were discussed including the risks of infection, the risk of hemorrhage that  could necessitate transfusion with the risk of AIDS or hepatitis, risk of injury to adjacent organs including bladder, bowel, ureters that could require further exploratory surgery, the risk of deep venous thrombosis and pulmonary embolus. Patient expressed understanding of indications and risks and the higher risk of cesarean section versus vaginal delivery.  PROCEDURE:                    Patient taken to the OR and placed in supine position with left lateral tilt.  After a satisfactory level of spinal anesthesia was obtained the abdomen was prepped out with Betadine and draped as a sterile field.  A low transverse skin incision was made with the knife, carried through subcutaneous tissue.  The anterior rectus fascia was entered sharply and incision of the fascia extended laterally.  The fascia was taken off the muscles superiorly and inferiorly.  Rectus muscles were separated in the midline.  The anterior peritoneum was entered  sharply.  Incision of peritoneum extended both superiorly and inferiorly.  Low transverse bladder flap was developed.  Low transverse uterine incision was begun with the knife and extended laterally using manual traction.  The infant presented in the vertex presentation ______ elevation of head and fundal pressure.  The infant was a viable female who weighed 5 pounds.  Apgars were 8/9.  Umbilical artery pH 7.30.  Placenta was then delivered manually.  Uterus was wiped free of remaining membranes and placenta.  Uterus was exteriorized for closure.  Tubes and ovaries were unremarkable.  Uterus was closed in a running locking suture of 0 chromic using two layer closure technique.  We had good hemostasis. Uterus was returned to the abdominal cavity.  Pelvic cavity was thoroughly irrigated.  Hemostasis was excellent.  Urine output remained clear and adequate.  Muscles reapproximated with running suture 3-0 Vicryl.  Fascia closed with running suture 0 PDS.  Skin was  closed with staples and Steri-Strips.  Sponge, instrument, and needle count reported as correct by circulated nurse x2.  Foley catheter remained clear at time of closure. Patient tolerated procedure well.  Was returned to recovery room in good condition. Dictated by:   Juluis Mire, M.D. Attending Physician:  Minette Headland DD:  02/17/01 TD:  02/17/01 Job: [id number]

## 2010-06-28 NOTE — Assessment & Plan Note (Signed)
[facility]                            CARDIOLOGY OFFICE NOTE   NAME:CLONTZMallory, Schaad                         MRN:          [id number]  DATE:02/04/2006                            DOB:          [birth year]    Ms. Hawbaker returns today to discuss the findings of her studies, and  also talk about statin therapy for hyperlipidemia.   She is only 45 years of age, though she has premature coronary artery  disease history in her family, and she has hypertension for the last 5  years.  Her total cholesterol was 202.  Triglycerides were 99.  HDL was  38.6.  LDL was 151.5.  LFTs were normal, as was her metabolic panel.   Echocardiogram was normal, showing no evidence of LVH.  Her ejection  fraction is normal.   I spent 20 minutes talking about risk factor modification with Ms.  Zeiser.  I clearly put her in the 5% to 10% chance of having a coronary  event in the next 10 years despite her young age.  We talked about  reducing the risk of plaque progression and also soft vulnerable plaque.   After a long discussion, we started her on Lipitor 10 mg a day.  With  her on Effexor and some of her other meds, I wanted the cleanest drug I  could use, hence, not a generic such as lovastatin or simvastatin.   She will come back for fasting lipids and LFTs in 6 weeks.   We also talked about TLC for increasing her HDL.  This includes  exercise, losing weight as well as diet therapy, which we reviewed at  length.     Thomas C. Daleen Squibb, MD, [facility]  Electronically Signed    TCW/MedQ  DD: 02/04/2006  DT: 02/04/2006  Job #: [id number]   cc:   Juluis Mire, M.D.

## 2010-06-28 NOTE — Discharge Summary (Signed)
[facility]  Patient:    Shelly Brown, Shelly Brown Visit Number: [id number] MRN: [id number]          Service Type: OBS Location: 9400 [id number] 01 Attending Physician:  Minette Headland Dictated by:   Danie Chandler, R.N. Admit Date:  01/25/2001 Discharge Date: 02/21/2001                             Discharge Summary  ADMISSION DIAGNOSES:          1. Intrauterine pregnancy at [redacted] weeks gestation.                               2. Chronic hypertension with superimposed                                  preeclampsia.  DISCHARGE DIAGNOSES:          1. Intrauterine pregnancy at [redacted] weeks gestation                                  with severe toxemia.                               2. Mature fetal pulmonary studies by                                  amniocentesis.                               3. Desires primary cesarean section.                               4. Endometritis.  PROCEDURE:                    On February 17, 2001, primary low transverse cesarean section.  REASON FOR ADMISSION:         The patient is a 45 year old primigravida married white female who was admitted to the hospital on December 16, at which time she was approximately 31 weeks.  The patient had some mildly elevated blood pressures in the office.  Subsequently, they started to increase with increasing proteinuria.  The patient had been on labetolol for this.  It was felt that she probably had a chronic hypertension with superimposed preeclampsia and was admitted.  During the hospitalization, the patient had excessive proteinuria.  There were no other signs of worsening toxemia. Because of the proteinuria, she was maintained on hospital monitoring.  At 34 weeks, an amniocentesis was performed with an LS of 2.9 to 1 with PG.  An offer for trial of labor through induction was declined by the patient.  She desired primary cesarean section.  HOSPITAL COURSE:              The patient was taken to  the operating room and underwent the above named procedure without complications.  This was productive of a viable female infant with Apgars of 8 at one minute and 9 at five minutes and an arterial  cord pH of 7.30.  Postoperatively on day #1, the patient was on magnesium sulfate.  She was without complaints.  Blood pressure was 123/73 and urine output was 300 cc an hour.  Hemoglobin was 11.9 and platelets 249,000.  Her magnesium sulfate was discontinued following 24 hours and on postoperative day #2 the patient was complaining of migraines for which she had a history of.  She did not have any visual changes.  Blood pressure was 130s/70s to 80s and urine output was 300 to 500 cc an hour.  The patient had been afebrile up until approximately 3 p.m. on this day at which time she had a temperature of 101.8.  [facility] labs were within normal limits.  Uterus was firm, but mildly tender.  The patient was started on Cefotan for probable endometritis.  She was also given IV fluids, Fioricet, and Imitrex as needed for migraines.  On postoperative day #3, the patients migraines were better. Her temperature was 100.5, uterus nontender.  Incision was clean, dry, and intact. She was continued on IV antibiotics and medicines as needed for migraine.  Anesthesia also consulted and the following day which is postoperative day #4, the patient was without complaint. Her vital signs were stable.  Uterus was nontender.  She was doing well and she was afebrile, so she was discharged home on this day.  CONDITION ON DISCHARGE:       Good.  DIET:                         Regular as tolerated.  ACTIVITY:                     No heavy lifting, no driving, no vaginal entry.  FOLLOW-UP:                    She is to follow up in the office in one to two weeks and she is to call for temperature greater than 100 degrees, persistent nausea and vomiting, heavy vaginal bleeding, and/or redness or drainage from the incision  site.  DISCHARGE MEDICATIONS:        1. Tylox #30 one to two p.o. q.4h. p.r.n. pain.                               2. Fioricet one every four hours as needed for                                  headache.                               3. Ibuprofen as needed.                               4. Ceftin 250 mg one twice a day for 7 days.                               5. Labetolol 100 mg twice a day.                               6.  Prenatal vitamins daily. Dictated by:   Danie Chandler, R.N. Attending Physician:  Minette Headland DD:  03/04/01 TD:  03/05/01 Job: [id number]

## 2010-06-28 NOTE — H&P (Signed)
[facility]  Patient:    Shelly Brown, Shelly Brown Visit Number: [id number] MRN: [id number]          Service Type: Attending:  Juluis Mire, M.D. Dictated by:   Juluis Mire, M.D. Adm. Date:  01/25/01                           History and Physical  REASON FOR ADMISSION:         The patient is a 45 year old gravida 1 para 0 married white female, last menstrual period Jun 22, 2000 giving her an estimated date of confinement of February 17 and estimated gestational age of [redacted] weeks.  This is consistent with prior ultrasound and initial exam.  She is admitted at the present time with evidence of worsening toxemia.  HISTORY OF PRESENT ILLNESS:   The patient has had some mildly elevated blood pressures in the office since admission and probably has some mild chronic hypertension.  Last week she was admitted with increasing blood pressures and proteinuria for evaluation.  A 24-hour urine revealed a little over 1000 mg of protein but a normal creatinine clearance; all other lab work was normal. Prior ultrasounds have revealed no evidence of intrauterine growth restriction.  She had a reactive nonstress test today.  She does complain of some headaches, no scotoma to her right upper quadrant plane.  Blood pressure was 138/88.  She is on labetalol 100 mg twice a day; however, she is spilling 3+ protein and therefore will be readmitted for reevaluation of possibly worsening chronic hypertension with superimposed preeclampsia.  ALLERGIES:                    No known drug allergies.  MEDICATIONS:                  Prenatal vitamins and labetalol.  HISTORY:                      For past medical history, family history, and social history please see prenatal records.  PHYSICAL EXAMINATION:  VITAL SIGNS:                  Patients blood pressure is 138/88; the vital signs are stable.  LUNGS:                        Clear.  CARDIOVASCULAR:               Regular rhythm  and rate with a grade 2/6 systolic ejection murmur, no click or gallops.  ABDOMEN:                      Gravid uterus consistent with dates, nontender. Fetal heart tones are audible.  PELVIC:                       Not performed.  EXTREMITIES:                  Deep tendon reflexes 2-3+, no clonus.  Does have 3+ pitting edema.  IMPRESSION:                   1. Intrauterine pregnancy at 31 weeks.  2. Chronic hypertension with superimposed                                  preeclampsia.  PLAN:                         Patient will be readmitted for toxemic workup. Please see orders. Dictated by:   Juluis Mire, M.D. Attending:  Juluis Mire, M.D. DD:  01/25/01 TD:  01/25/01 Job: [id number]

## 2010-07-12 ENCOUNTER — Other Ambulatory Visit: Payer: Self-pay | Admitting: Cardiology

## 2010-09-24 ENCOUNTER — Ambulatory Visit (INDEPENDENT_AMBULATORY_CARE_PROVIDER_SITE_OTHER): Payer: BC Managed Care – PPO | Admitting: Nurse Practitioner

## 2010-09-24 ENCOUNTER — Encounter: Payer: Self-pay | Admitting: Nurse Practitioner

## 2010-09-24 DIAGNOSIS — G43109 Migraine with aura, not intractable, without status migrainosus: Secondary | ICD-10-CM

## 2010-09-24 NOTE — Progress Notes (Signed)
  Subjective:    Patient ID: Shelly Brown, female    DOB: 1965/03/10, 45 y.o.   MRN: [id number]  HPI Pt had surgery for cervical disc repair in March 2012. Had been doing very well until about 1 month ago. At that point she had come off all pain meds and had resumed her presurgery activity. She has been having a daily migraine for the last month and severe one in last few days. She has taken Imitrex and vicoden yesterday with some relief. Today headache is back. In the last month she has returned to surgeon and he has repeated MRI  And weaned her off all pain meds.     Review of Systems     Objective:   Physical Exam  Alert, anxious female in NAD      Assessment & Plan:  A: Migraine Headache ongoing/ likely to withdrawal of vicoden P: Prednisone taper start today. Rx for prednisone 20mg  4 tabs x1 day, 3 tabs x 1 day, 2 tabs x1 day and 1 tab x 1 day. Hopefully this will break headache cycle for her. Add second preventive Topamax 25mg  qhs x1 week, then 50mg  qhs x1 week then 75mg  qhs till seen again. Encouraged to start water stretching q1 hour/ day. Discussed activity level and trying to avoid pain medication at this time. RTC 6 weeks or prn

## 2010-09-24 NOTE — Patient Instructions (Signed)
Computer was not working at time of visit and medications were hand written

## 2010-10-22 ENCOUNTER — Telehealth: Payer: Self-pay | Admitting: Nurse Practitioner

## 2010-10-22 NOTE — Telephone Encounter (Signed)
Shelly Brown would like for your to call her regarding the Topamax (new Med) you put her on.  She is still having headaches.

## 2010-10-25 ENCOUNTER — Telehealth: Payer: Self-pay | Admitting: *Deleted

## 2010-10-25 DIAGNOSIS — F419 Anxiety disorder, unspecified: Secondary | ICD-10-CM

## 2010-10-25 DIAGNOSIS — G4700 Insomnia, unspecified: Secondary | ICD-10-CM

## 2010-10-25 MED ORDER — CLONAZEPAM 0.5 MG PO TABS: 0.5000 mg | ORAL_TABLET | Freq: Two times a day (BID) | ORAL | Status: DC | PRN

## 2010-10-25 MED ORDER — ZOLPIDEM TARTRATE 10 MG PO TABS: 10.0000 mg | ORAL_TABLET | Freq: Every evening | ORAL | Status: DC | PRN

## 2010-10-25 MED ORDER — CLONAZEPAM 0.5 MG PO TABS: 0.5000 mg | ORAL_TABLET | Freq: Every day | ORAL | Status: DC | PRN

## 2010-10-25 NOTE — Telephone Encounter (Signed)
Patient wishes to have the medicine Klonopin called in and [place] refilled.  Ok Per Bonita Quin to use Klonopin prn. rx faxed to patients pharmacy.

## 2010-10-29 ENCOUNTER — Encounter: Payer: BC Managed Care – PPO | Admitting: Nurse Practitioner

## 2010-11-05 ENCOUNTER — Ambulatory Visit (INDEPENDENT_AMBULATORY_CARE_PROVIDER_SITE_OTHER): Payer: BC Managed Care – PPO | Admitting: Nurse Practitioner

## 2010-11-05 ENCOUNTER — Encounter: Payer: Self-pay | Admitting: Nurse Practitioner

## 2010-11-05 VITALS — BP 125/80 | HR 69 | Wt 185.0 lb

## 2010-11-05 DIAGNOSIS — G43909 Migraine, unspecified, not intractable, without status migrainosus: Secondary | ICD-10-CM

## 2010-11-05 DIAGNOSIS — M62838 Other muscle spasm: Secondary | ICD-10-CM

## 2010-11-05 DIAGNOSIS — Z8669 Personal history of other diseases of the nervous system and sense organs: Secondary | ICD-10-CM | POA: Insufficient documentation

## 2010-11-05 DIAGNOSIS — F411 Generalized anxiety disorder: Secondary | ICD-10-CM

## 2010-11-05 DIAGNOSIS — F419 Anxiety disorder, unspecified: Secondary | ICD-10-CM

## 2010-11-05 MED ORDER — TIZANIDINE HCL 4 MG PO TABS: 4.0000 mg | ORAL_TABLET | Freq: Three times a day (TID) | ORAL | Status: AC | PRN

## 2010-11-05 MED ORDER — DULOXETINE HCL 60 MG PO CPEP: 60.0000 mg | ORAL_CAPSULE | Freq: Every day | ORAL | Status: DC

## 2010-11-05 NOTE — Telephone Encounter (Signed)
Spoke with Shelly Brown today. Has been taking Klonopin daily instead of prn. Admits she has felt best on pain meds, xanax and or klonopin. Advised we would not be able to continue any of these as they are all addictive. Currently on Cymbalta 60mg  and topamax 75mg . Has chronic pain, depression and anxiety. Now feels neck muscles are very tight and that is leading to headache. Not getting that much relief from flexeril.   Today she is offered trigger point injections, zanaflex to replace flexeril and consider botox.

## 2010-11-05 NOTE — Progress Notes (Signed)
S: Pt called in C/O muscle spasm and headache. Had been called in RX for Klonopin to take on prn basis. Has been taking it daily. States she feels very nervous and has sx depression. Currently not working and not exercising. She was advised over phone today that we would no longer call in klonopin and that her options for improvement were today trigger point injections, change flexeril to zanaflex.  O: Pt has tightness in traps bilat and tenderness in occipital nerve regions.   Trigger point Injection : see procedure sheet   A: migraine headache Muscle spasm Anxiety Depression  P: TPIs done today. Flexeril discontinued. Restart zanaflex. Advised pt seek counseling with Dr Caralyn Guile at [facility] asap. Advised exercise, starting with mild walking program. Advised going back to work or school.

## 2010-11-12 ENCOUNTER — Encounter: Payer: Self-pay | Admitting: Cardiology

## 2010-11-12 ENCOUNTER — Ambulatory Visit (INDEPENDENT_AMBULATORY_CARE_PROVIDER_SITE_OTHER): Payer: BC Managed Care – PPO | Admitting: Cardiology

## 2010-11-12 VITALS — BP 100/70 | HR 66 | Ht 64.0 in | Wt 184.0 lb

## 2010-11-12 DIAGNOSIS — I10 Essential (primary) hypertension: Secondary | ICD-10-CM

## 2010-11-12 DIAGNOSIS — E785 Hyperlipidemia, unspecified: Secondary | ICD-10-CM

## 2010-11-12 NOTE — Patient Instructions (Signed)
Your physician recommends that you return for lab work on Thursday October 4,2012  You have been referred to Dr. Dayton Martes at [facility]

## 2010-11-12 NOTE — Assessment & Plan Note (Signed)
Stable

## 2010-11-12 NOTE — Progress Notes (Signed)
HPI [name] returns today for evaluation and management of her hypertension and hyperlipidemia. He is  She denies any chest pain or angina. Her blood pressure in good control. She is overdue blood work.  She does not have a primary care physician. I will obtain her one today.  Her EKG shows normal sinus rhythm with nonspecific T-wave changes.  Past Medical History  Diagnosis Date  . Migraine   . Hypertension   . Anxiety   . Insomnia   . Depression     Past Surgical History  Procedure Date  . Cesarean section   . Wisdom tooth extraction   . Cervical spine surgery 05/03/2010    herniated disc    Family History  Problem Relation Age of Onset  . Heart disease Paternal Grandfather     HEART ATTACK  . Heart disease Father     HEART ATTACK    History   Social History  . Marital Status: Married    Spouse Name: N/A    Number of Children: N/A  . Years of Education: N/A   Occupational History  . Not on file.   Social History Main Topics  . Smoking status: Never Smoker   . Smokeless tobacco: Not on file  . Alcohol Use: Yes     social  . Drug Use: No  . Sexually Active: Yes -- Female partner(s)   Other Topics Concern  . Not on file   Social History Narrative  . No narrative on file    No Known Allergies  Current Outpatient Prescriptions  Medication Sig Dispense Refill  . clonazePAM (KLONOPIN) 0.5 MG tablet Take 1 tablet (0.5 mg total) by mouth daily as needed for anxiety (take one tablet daily as needed).  15 tablet  0  . DULoxetine (CYMBALTA) 60 MG capsule Take 1 capsule (60 mg total) by mouth daily.  30 capsule  6  . labetalol (NORMODYNE) 100 MG tablet TAKE 1 TABLET BY MOUTH TWICE A DAY  180 tablet  2  . SUMAtriptan (IMITREX) 100 MG tablet Take 100 mg by mouth every 2 (two) hours as needed.        Marland Kitchen tiZANidine (ZANAFLEX) 4 MG tablet Take 1 tablet (4 mg total) by mouth every 8 (eight) hours as needed.  90 tablet  1  . topiramate (TOPAMAX) 25 MG capsule Take 25  mg by mouth 3 (three) times daily.        Marland Kitchen zolpidem (AMBIEN) 10 MG tablet Take 1 tablet (10 mg total) by mouth at bedtime as needed.  30 tablet  2    ROS Negative other than HPI.   PE General Appearance: well developed, well nourished in no acute distress HEENT: symmetrical face, PERRLA, good dentition  Neck: no JVD, thyromegaly, or adenopathy, trachea midline Chest: symmetric without deformity Cardiac: PMI non-displaced, RRR, normal S1, S2, no gallop or murmur Lung: clear to ausculation and percussion Vascular: all pulses full without bruits  Abdominal: nondistended, nontender, good bowel sounds, no HSM, no bruits Extremities: no cyanosis, clubbing or edema, no sign of DVT, no varicosities  Skin: normal color, no rashes Neuro: alert and oriented x 3, non-focal Pysch: normal affect Filed Vitals:   11/12/10 0933  BP: 100/70  Pulse: 66  Height: 5\' 4"  (1.626 m)  Weight: 184 lb (83.462 kg)    EKG  Labs and Studies Reviewed.   Lab Results  Component Value Date   WBC 6.8 05/02/2010   HGB 13.3 05/02/2010   HCT 39.3 05/02/2010  MCV 84.2 05/02/2010   PLT 302 05/02/2010      Chemistry      Component Value Date/Time   NA 136 05/02/2010 1144   K 4.1 05/02/2010 1144   CL 103 05/02/2010 1144   CO2 28 05/02/2010 1144   BUN 6 05/02/2010 1144   CREATININE 0.80 05/02/2010 1144      Component Value Date/Time   CALCIUM 9.3 05/02/2010 1144   ALKPHOS 68 12/03/2009 0833   AST 17 12/03/2009 0833   ALT 13 12/03/2009 0833   BILITOT 1.0 12/03/2009 0833       Lab Results  Component Value Date   CHOL 196 12/03/2009   CHOL 170 10/09/2008   Lab Results  Component Value Date   HDL 49.20 12/03/2009   HDL 33.10* 10/09/2008   Lab Results  Component Value Date   LDLCALC 125* 12/03/2009   LDLCALC 109* 10/09/2008   Lab Results  Component Value Date   TRIG 108.0 12/03/2009   TRIG 138.0 10/09/2008   Lab Results  Component Value Date   CHOLHDL 4 12/03/2009   CHOLHDL 5 10/09/2008   No  results found for this basename: HGBA1C   Lab Results  Component Value Date   ALT 13 12/03/2009   AST 17 12/03/2009   ALKPHOS 68 12/03/2009   BILITOT 1.0 12/03/2009   Lab Results  Component Value Date   TSH 0.83 12/03/2009  the Past Medical History  Diagnosis Date  . Migraine   . Hypertension   . Anxiety   . Insomnia   . Depression     Past Surgical History  Procedure Date  . Cesarean section   . Wisdom tooth extraction   . Cervical spine surgery 05/03/2010    herniated disc    Family History  Problem Relation Age of Onset  . Heart disease Paternal Grandfather     HEART ATTACK  . Heart disease Father     HEART ATTACK    History   Social History  . Marital Status: Married    Spouse Name: N/A    Number of Children: N/A  . Years of Education: N/A   Occupational History  . Not on file.   Social History Main Topics  . Smoking status: Never Smoker   . Smokeless tobacco: Not on file  . Alcohol Use: Yes     social  . Drug Use: No  . Sexually Active: Yes -- Female partner(s)   Other Topics Concern  . Not on file   Social History Narrative  . No narrative on file    No Known Allergies  Current Outpatient Prescriptions  Medication Sig Dispense Refill  . clonazePAM (KLONOPIN) 0.5 MG tablet Take 1 tablet (0.5 mg total) by mouth daily as needed for anxiety (take one tablet daily as needed).  15 tablet  0  . DULoxetine (CYMBALTA) 60 MG capsule Take 1 capsule (60 mg total) by mouth daily.  30 capsule  6  . labetalol (NORMODYNE) 100 MG tablet TAKE 1 TABLET BY MOUTH TWICE A DAY  180 tablet  2  . SUMAtriptan (IMITREX) 100 MG tablet Take 100 mg by mouth every 2 (two) hours as needed.        Marland Kitchen tiZANidine (ZANAFLEX) 4 MG tablet Take 1 tablet (4 mg total) by mouth every 8 (eight) hours as needed.  90 tablet  1  . topiramate (TOPAMAX) 25 MG capsule Take 25 mg by mouth 3 (three) times daily.        Marland Kitchen  zolpidem (AMBIEN) 10 MG tablet Take 1 tablet (10 mg total) by mouth at  bedtime as needed.  30 tablet  2    ROS Negative other than HPI.   PE  Filed Vitals:   11/12/10 0933  BP: 100/70  Pulse: 66  Height: 5\' 4"  (1.626 m)  Weight: 184 lb (83.462 kg)    EKG  Labs and Studies Reviewed.   Lab Results  Component Value Date   WBC 6.8 05/02/2010   HGB 13.3 05/02/2010   HCT 39.3 05/02/2010   MCV 84.2 05/02/2010   PLT 302 05/02/2010      Chemistry      Component Value Date/Time   NA 136 05/02/2010 1144   K 4.1 05/02/2010 1144   CL 103 05/02/2010 1144   CO2 28 05/02/2010 1144   BUN 6 05/02/2010 1144   CREATININE 0.80 05/02/2010 1144      Component Value Date/Time   CALCIUM 9.3 05/02/2010 1144   ALKPHOS 68 12/03/2009 0833   AST 17 12/03/2009 0833   ALT 13 12/03/2009 0833   BILITOT 1.0 12/03/2009 0833       Lab Results  Component Value Date   CHOL 196 12/03/2009   CHOL 170 10/09/2008   Lab Results  Component Value Date   HDL 49.20 12/03/2009   HDL 33.10* 10/09/2008   Lab Results  Component Value Date   LDLCALC 125* 12/03/2009   LDLCALC 109* 10/09/2008   Lab Results  Component Value Date   TRIG 108.0 12/03/2009   TRIG 138.0 10/09/2008   Lab Results  Component Value Date   CHOLHDL 4 12/03/2009   CHOLHDL 5 10/09/2008   No results found for this basename: HGBA1C   Lab Results  Component Value Date   ALT 13 12/03/2009   AST 17 12/03/2009   ALKPHOS 68 12/03/2009   BILITOT 1.0 12/03/2009   Lab Results  Component Value Date   TSH 0.83 12/03/2009  one

## 2010-11-12 NOTE — Assessment & Plan Note (Signed)
Fasting lipids and LFTs scheduled this week. Followup with Dr.Aron as her new primary care physician.

## 2010-11-14 ENCOUNTER — Other Ambulatory Visit (INDEPENDENT_AMBULATORY_CARE_PROVIDER_SITE_OTHER): Payer: BC Managed Care – PPO | Admitting: *Deleted

## 2010-11-14 ENCOUNTER — Other Ambulatory Visit: Payer: Self-pay | Admitting: Cardiology

## 2010-11-14 DIAGNOSIS — E785 Hyperlipidemia, unspecified: Secondary | ICD-10-CM

## 2010-11-14 LAB — HEPATIC FUNCTION PANEL
Alkaline Phosphatase: 78 U/L (ref 39–117)
Bilirubin, Direct: 0 mg/dL (ref 0.0–0.3)
Total Bilirubin: 1.2 mg/dL (ref 0.3–1.2)
Total Protein: 7.7 g/dL (ref 6.0–8.3)

## 2010-11-14 LAB — LIPID PANEL
Total CHOL/HDL Ratio: 5
VLDL: 35.8 mg/dL (ref 0.0–40.0)

## 2010-11-27 ENCOUNTER — Encounter: Payer: Self-pay | Admitting: Family Medicine

## 2010-11-27 ENCOUNTER — Ambulatory Visit (INDEPENDENT_AMBULATORY_CARE_PROVIDER_SITE_OTHER): Payer: BC Managed Care – PPO | Admitting: Family Medicine

## 2010-11-27 VITALS — BP 144/98 | HR 72 | Temp 98.5°F | Ht 64.0 in | Wt 175.0 lb

## 2010-11-27 DIAGNOSIS — I10 Essential (primary) hypertension: Secondary | ICD-10-CM

## 2010-11-27 DIAGNOSIS — E785 Hyperlipidemia, unspecified: Secondary | ICD-10-CM

## 2010-11-27 DIAGNOSIS — J309 Allergic rhinitis, unspecified: Secondary | ICD-10-CM

## 2010-11-27 DIAGNOSIS — F419 Anxiety disorder, unspecified: Secondary | ICD-10-CM | POA: Insufficient documentation

## 2010-11-27 DIAGNOSIS — J302 Other seasonal allergic rhinitis: Secondary | ICD-10-CM | POA: Insufficient documentation

## 2010-11-27 DIAGNOSIS — F341 Dysthymic disorder: Secondary | ICD-10-CM

## 2010-11-27 DIAGNOSIS — K219 Gastro-esophageal reflux disease without esophagitis: Secondary | ICD-10-CM | POA: Insufficient documentation

## 2010-11-27 DIAGNOSIS — Z23 Encounter for immunization: Secondary | ICD-10-CM

## 2010-11-27 DIAGNOSIS — F329 Major depressive disorder, single episode, unspecified: Secondary | ICD-10-CM | POA: Insufficient documentation

## 2010-11-27 MED ORDER — OMEPRAZOLE 40 MG PO CPDR: 40.0000 mg | DELAYED_RELEASE_CAPSULE | Freq: Every day | ORAL | Status: DC

## 2010-11-27 NOTE — Assessment & Plan Note (Signed)
On labetalol for this, today elevated. States normally much better controlled. Did recently start stimulant. rec pt keep track of BP at home, notify me if consistently >140/90.  Also to bring log to psych appt in 4-5 wks.

## 2010-11-27 NOTE — Assessment & Plan Note (Signed)
Recently worsening.  Trial of omeprazole 40mg  daily for next several weeks.   If not improving, consider GI referral for EGD vs testing for h pylori. Discussed GERD precautions.

## 2010-11-27 NOTE — Assessment & Plan Note (Signed)
Followed by Dr. Westley Chandler.

## 2010-11-27 NOTE — Progress Notes (Signed)
Subjective:    Patient ID: Shelly Brown, female    DOB: June 02, 1965, 45 y.o.   MRN: [id number]  HPI CC: new pt  bp elevated today - normally on labetalol 100mg  bid.  Pre eclampsia at pregnancy.  No change in HA, denies vision changes, CP/tightness, SOB, leg swelling.   HLD - strong family history of CAD, LDL when checked was 185.  Goal <130.  Looking into getting breast reduction (saw Dr. Stephens November at [facility]).  Anxiety - sees Dr. Westley Chandler, recently started on klonopin 0.5mg  qid, ran out 2 days ago, having trouble filling from pharmacy (2/2 insurance).  GERD - burning mid chest and abd discomfort notices worse after certain foods (caffeine, spicy foods, lettuce).  Has been on zantac over a year.  Takes tums as well.  No fevers/chills, indigestion, early satiety.  Overweight - has actually lost 10 lbs.  Eating less.  Preventative: Blood work done 2 wks ago with Dr Daleen Squibb. Last well woman exam 10/2010 - normal pap smear and mammogram. No recent physical. Flu shot declined.  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  Medications and allergies reviewed and updated in chart.  Past histories reviewed and updated if relevant as below. Patient Active Problem List  Diagnoses  . HYPERLIPIDEMIA  . HYPERTENSION, BENIGN  . HEADACHE  . Migraine   Past Medical History  Diagnosis Date  . Migraine   . Hypertension   . Anxiety and depression     mainly anxiety, Dr. Westley Chandler  . Insomnia   . Arthritis   . GERD (gastroesophageal reflux disease)   . Seasonal allergies   . History of chicken pox    Past Surgical History  Procedure Date  . Cesarean section 2003  . Wisdom tooth extraction 1998  . Cervical spine surgery 05/03/2010    with fusion for herniated disc   History    Substance Use Topics  . Smoking status: Never Smoker   . Smokeless tobacco: Never Used  . Alcohol Use: Yes     social   Family History  Problem Relation Age of Onset  . Coronary artery disease Paternal Grandfather 69    MI  . Arthritis Father   . Coronary artery disease Father 36    MI  . Arthritis Mother   . Arthritis Maternal Grandfather   . Arthritis Maternal Grandmother   . Migraines Maternal Grandmother   . Migraines Sister   . Coronary artery disease Paternal Uncle   . Diabetes Neg Hx   . Stroke Neg Hx   . Cancer Neg Hx    Allergies  Allergen Reactions  . Morphine And Related Other (See Comments)    Hallucinations   Current Outpatient Prescriptions on File Prior to Visit  Medication Sig Dispense Refill  . DULoxetine (CYMBALTA) 60 MG capsule Take 1 capsule (60 mg total) by mouth daily.  30 capsule  6  . labetalol (NORMODYNE) 100 MG tablet TAKE 1 TABLET BY MOUTH TWICE A DAY  180 tablet  2  . SUMAtriptan (IMITREX) 100 MG tablet Take 100 mg by mouth every 2 (two) hours as needed.        . topiramate (TOPAMAX) 25 MG capsule Take 25 mg by mouth 3 (three) times daily.        Marland Kitchen zolpidem (AMBIEN) 10 MG tablet Take  1 tablet (10 mg total) by mouth at bedtime as needed.  30 tablet  2   Review of Systems  Constitutional: Negative for fever, chills, activity change, appetite change, fatigue and unexpected weight change.  HENT: Negative for hearing loss and neck pain.   Eyes: Negative for visual disturbance.  Respiratory: Negative for cough, chest tightness, shortness of breath and wheezing.   Cardiovascular: Negative for chest pain, palpitations and leg swelling.  Gastrointestinal: Negative for nausea, vomiting, abdominal pain, diarrhea, constipation, blood in stool and abdominal distention.  Genitourinary: Negative for hematuria and difficulty urinating.  Musculoskeletal: Negative for myalgias and arthralgias.  Skin: Negative for rash.  Neurological: Negative for dizziness,  seizures, syncope and headaches.  Hematological: Does not bruise/bleed easily.  Psychiatric/Behavioral: Negative for dysphoric mood. The patient is nervous/anxious.       Objective:   Physical Exam  Nursing note and vitals reviewed. Constitutional: She is oriented to person, place, and time. She appears well-developed and well-nourished. No distress.  HENT:  Head: Normocephalic and atraumatic.  Right Ear: External ear normal.  Left Ear: External ear normal.  Nose: Nose normal.  Mouth/Throat: Oropharynx is clear and moist.  Eyes: Conjunctivae and EOM are normal. Pupils are equal, round, and reactive to light.  Neck: Normal range of motion. Neck supple. No thyromegaly present.  Cardiovascular: Normal rate, regular rhythm, normal heart sounds and intact distal pulses.   No murmur heard. Pulses:      Radial pulses are 2+ on the right side, and 2+ on the left side.  Pulmonary/Chest: Effort normal and breath sounds normal. No respiratory distress. She has no wheezes. She has no rales.  Abdominal: Soft. Bowel sounds are normal. She exhibits no distension and no mass. There is no tenderness. There is no rebound and no guarding.  Musculoskeletal: Normal range of motion.  Lymphadenopathy:    She has no cervical adenopathy.  Neurological: She is alert and oriented to person, place, and time.       CN grossly intact, station and gait intact  Skin: Skin is warm and dry. No rash noted.  Psychiatric: She has a normal mood and affect. Her behavior is normal. Judgment and thought content normal.      Assessment & Plan:

## 2010-11-27 NOTE — Assessment & Plan Note (Addendum)
Discussed goal LDL lower given strong family history. Discussed low cholesterol diet, mailed handout to patient. TLC for now, return 6 mo for recheck blood work, weight.

## 2010-11-27 NOTE — Patient Instructions (Signed)
Flu shot today. Keep track of blood pressure at home.  If consistently >140/90, call me and let me know. Return to see me in 6 months for follow up and physical.  Return prior fasting for blood work. For reflux:  Head of bed elevated. Avoidance of citrus, fatty foods, chocolate, peppermint, and excessive alcohol, along with sodas, orange juice (acidic drinks) At least a few hours between dinner and bed, minimize naps after eating. Trial of reflux medicine.  If not helping, please let me know as well.

## 2010-11-29 ENCOUNTER — Encounter: Payer: Self-pay | Admitting: Family Medicine

## 2010-12-12 HISTORY — PX: REDUCTION MAMMAPLASTY: SUR839

## 2010-12-18 ENCOUNTER — Telehealth: Payer: Self-pay | Admitting: Cardiology

## 2010-12-18 NOTE — Telephone Encounter (Signed)
All Cardiac faxed to [facility] @ [phone]  12/18/10/km

## 2010-12-19 ENCOUNTER — Other Ambulatory Visit: Payer: Self-pay | Admitting: Plastic Surgery

## 2011-01-16 ENCOUNTER — Other Ambulatory Visit: Payer: Self-pay | Admitting: *Deleted

## 2011-01-16 MED ORDER — OMEPRAZOLE 40 MG PO CPDR: 40.0000 mg | DELAYED_RELEASE_CAPSULE | Freq: Every day | ORAL | Status: DC

## 2011-01-16 NOTE — Telephone Encounter (Signed)
Patient required 90 day supply.

## 2011-01-29 ENCOUNTER — Other Ambulatory Visit: Payer: Self-pay | Admitting: *Deleted

## 2011-04-05 ENCOUNTER — Other Ambulatory Visit: Payer: Self-pay | Admitting: Cardiology

## 2011-05-21 ENCOUNTER — Other Ambulatory Visit (INDEPENDENT_AMBULATORY_CARE_PROVIDER_SITE_OTHER): Payer: BC Managed Care – PPO

## 2011-05-21 DIAGNOSIS — E785 Hyperlipidemia, unspecified: Secondary | ICD-10-CM

## 2011-05-21 DIAGNOSIS — I10 Essential (primary) hypertension: Secondary | ICD-10-CM

## 2011-05-21 LAB — COMPREHENSIVE METABOLIC PANEL
ALT: 14 U/L (ref 0–35)
AST: 22 U/L (ref 0–37)
Alkaline Phosphatase: 93 U/L (ref 39–117)
Creatinine, Ser: 0.7 mg/dL (ref 0.4–1.2)
Sodium: 144 mEq/L (ref 135–145)
Total Bilirubin: 0.5 mg/dL (ref 0.3–1.2)
Total Protein: 7.1 g/dL (ref 6.0–8.3)

## 2011-05-21 LAB — LIPID PANEL
Cholesterol: 206 mg/dL — ABNORMAL HIGH (ref 0–200)
HDL: 52 mg/dL (ref >39.00)
Total CHOL/HDL Ratio: 4
Triglycerides: 154 mg/dL — ABNORMAL HIGH (ref 0.0–149.0)
VLDL: 30.8 mg/dL (ref 0.0–40.0)

## 2011-05-28 ENCOUNTER — Encounter: Payer: Self-pay | Admitting: Family Medicine

## 2011-05-28 ENCOUNTER — Ambulatory Visit (INDEPENDENT_AMBULATORY_CARE_PROVIDER_SITE_OTHER): Payer: BC Managed Care – PPO | Admitting: Family Medicine

## 2011-05-28 VITALS — BP 110/76 | HR 68 | Temp 98.4°F | Ht 64.0 in | Wt 166.2 lb

## 2011-05-28 DIAGNOSIS — K219 Gastro-esophageal reflux disease without esophagitis: Secondary | ICD-10-CM

## 2011-05-28 DIAGNOSIS — E785 Hyperlipidemia, unspecified: Secondary | ICD-10-CM

## 2011-05-28 DIAGNOSIS — F419 Anxiety disorder, unspecified: Secondary | ICD-10-CM

## 2011-05-28 DIAGNOSIS — Z0000 Encounter for general adult medical examination without abnormal findings: Secondary | ICD-10-CM

## 2011-05-28 DIAGNOSIS — G4733 Obstructive sleep apnea (adult) (pediatric): Secondary | ICD-10-CM

## 2011-05-28 DIAGNOSIS — Z23 Encounter for immunization: Secondary | ICD-10-CM

## 2011-05-28 DIAGNOSIS — F341 Dysthymic disorder: Secondary | ICD-10-CM

## 2011-05-28 DIAGNOSIS — I10 Essential (primary) hypertension: Secondary | ICD-10-CM

## 2011-05-28 DIAGNOSIS — R109 Unspecified abdominal pain: Secondary | ICD-10-CM | POA: Insufficient documentation

## 2011-05-28 MED ORDER — FLUTICASONE PROPIONATE 50 MCG/ACT NA SUSP: 2.0000 | Freq: Every day | NASAL | Status: DC | PRN

## 2011-05-28 MED ORDER — NAPROXEN 500 MG PO TABS: 500.0000 mg | ORAL_TABLET | Freq: Two times a day (BID) | ORAL | Status: DC | PRN

## 2011-05-28 NOTE — Assessment & Plan Note (Signed)
Great control despite recent addition of stimulant

## 2011-05-28 NOTE — Assessment & Plan Note (Signed)
Improved with prilosec

## 2011-05-28 NOTE — Assessment & Plan Note (Signed)
Preventative protocols reviewed and updated unless pt declined. Discussed healthy diet, lifestyle. Tdap today. No fmhx cancer.

## 2011-05-28 NOTE — Patient Instructions (Addendum)
Keep eye on stomach issues - increase water with increased fiber.  if not improving let me know. Tdap today. Low chol diet handout provided today. Good to see you today, call us with questions.

## 2011-05-28 NOTE — Assessment & Plan Note (Signed)
Followed by psych. On effexor xr, nuvigil and adderall.

## 2011-05-28 NOTE — Assessment & Plan Note (Signed)
With occasional diarrhea, h/o constipation in past. ?IBS. Could be due to recent increase in fiber without increasing water.  rec increased water. No blood in stool. If not better, further eval, consider GI referral as no h/o colonscopy but no fmhx cancers either.

## 2011-05-28 NOTE — Progress Notes (Signed)
Subjective:    Patient ID: Shelly Brown, female    DOB: May 27, 1965, 46 y.o.   MRN: [id number]  HPI CC: CPE today  On nuvigil and adderall and effexor XR per Dr. Westley Chandler psych (sees her for anxiety.  abd issues - some cramping with diarrhea during day.  No blood in stool.  Has increased fiber in diet, not water.  Looking for job, going through separation.  Wt Readings from Last 3 Encounters:  05/28/11 166 lb 4 oz (75.411 kg)  11/27/10 175 lb (79.379 kg)  11/12/10 184 lb (83.462 kg)  (breast reduction and trying to lose weight)  Preventative:  Last well woman exam 10/2010 - normal pap smear and mammogram 10/2010 normal per pt.  Dr. Arelia Sneddon at [facility]. Flu shot 2012 Tetanus - unsure.  Tdap today.  Caffeine: 2-3 cups  Lives with daughter (2003), 1 dog.  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 Diet: good fruits and vegetables, good water.  Medications and allergies reviewed and updated in chart.  Past histories reviewed and updated if relevant as below. Patient Active Problem List  Diagnoses  . HYPERLIPIDEMIA  . HYPERTENSION, BENIGN  . HEADACHE  . Migraine  . Anxiety and depression  . GERD (gastroesophageal reflux disease)  . Seasonal allergies   Past Medical History  Diagnosis Date  . Migraine   . Hypertension   . Anxiety and depression     mainly anxiety, Dr. Westley Chandler  . Insomnia   . Arthritis   . GERD (gastroesophageal reflux disease)   . Seasonal allergies   . History of chicken pox   . OSA (obstructive sleep apnea)     on CPAP   Past Surgical History  Procedure Date  . Cesarean section 2003  . Wisdom tooth extraction 1998  . Cervical spine surgery 05/03/2010    with fusion for herniated disc  . Breast reduction surgery 12/2010   History  Substance Use Topics  . Smoking status: Never Smoker   . Smokeless tobacco: Never Used  . Alcohol  Use: Yes     social   Family History  Problem Relation Age of Onset  . Coronary artery disease Paternal Grandfather 8    MI  . Arthritis Father   . Coronary artery disease Father 74    MI  . Arthritis Mother   . Arthritis Maternal Grandfather   . Arthritis Maternal Grandmother   . Migraines Maternal Grandmother   . Migraines Sister   . Coronary artery disease Paternal Uncle   . Diabetes Neg Hx   . Stroke Neg Hx   . Cancer Neg Hx    Allergies  Allergen Reactions  . Morphine And Related Other (See Comments)    Hallucinations   Current Outpatient Prescriptions on File Prior to Visit  Medication Sig Dispense Refill  . amphetamine-dextroamphetamine (ADDERALL) 30 MG tablet Take 30 mg by mouth 2 (two) times daily.      . Armodafinil (NUVIGIL) 250 MG tablet Take 250 mg by mouth daily.        Marland Kitchen labetalol (NORMODYNE) 100 MG tablet TAKE 1 TABLET BY MOUTH TWICE A DAY  180 tablet  2  . omeprazole (PRILOSEC) 40 MG capsule Take 1 capsule (40 mg total) by mouth daily.  90 capsule  2  . tiZANidine (ZANAFLEX) 4 MG tablet Take 4 mg by mouth every 8 (eight) hours as needed.        Marland Kitchen  topiramate (TOPAMAX) 25 MG capsule Take 75 mg by mouth at bedtime.       . tretinoin (RETIN-A) 0.025 % cream Apply 1 application topically at bedtime.        Marland Kitchen venlafaxine XR (EFFEXOR-XR) 150 MG 24 hr capsule Take 150 mg by mouth daily.      Marland Kitchen zolpidem (AMBIEN) 10 MG tablet Take 1 tablet (10 mg total) by mouth at bedtime as needed.  30 tablet  2  . DISCONTD: fluticasone (FLONASE) 50 MCG/ACT nasal spray Place 2 sprays into the nose daily as needed.        . clonazePAM (KLONOPIN) 0.5 MG tablet Take 1 tablet (0.5 mg total) by mouth daily as needed for anxiety (take one tablet daily as needed).  15 tablet  0  . SUMAtriptan (IMITREX) 100 MG tablet Take 100 mg by mouth every 2 (two) hours as needed.        Marland Kitchen DISCONTD: clonazePAM (KLONOPIN) 0.5 MG tablet Take 0.5 mg by mouth 4 (four) times daily.          Review of Systems   Constitutional: Negative for fever, chills, activity change, appetite change, fatigue and unexpected weight change.  HENT: Negative for hearing loss and neck pain.   Eyes: Negative for visual disturbance.  Respiratory: Negative for cough, chest tightness, shortness of breath and wheezing.   Cardiovascular: Negative for chest pain, palpitations and leg swelling.  Gastrointestinal: Positive for vomiting, abdominal pain and diarrhea. Negative for nausea, constipation, blood in stool and abdominal distention.  Genitourinary: Negative for hematuria and difficulty urinating.  Musculoskeletal: Negative for myalgias and arthralgias.  Skin: Negative for rash.  Neurological: Negative for dizziness, seizures, syncope and headaches.  Hematological: Does not bruise/bleed easily.  Psychiatric/Behavioral: Negative for dysphoric mood. The patient is nervous/anxious.        Objective:   Physical Exam  Nursing note and vitals reviewed. Constitutional: She is oriented to person, place, and time. She appears well-developed and well-nourished. No distress.  HENT:  Head: Normocephalic and atraumatic.  Right Ear: External ear normal.  Left Ear: External ear normal.  Nose: Nose normal.  Mouth/Throat: Oropharynx is clear and moist. No oropharyngeal exudate.  Eyes: Conjunctivae and EOM are normal. Pupils are equal, round, and reactive to light. No scleral icterus.  Neck: Normal range of motion. Neck supple. Carotid bruit is not present. No thyromegaly present.  Cardiovascular: Normal rate, regular rhythm, normal heart sounds and intact distal pulses.   No murmur heard. Pulses:      Radial pulses are 2+ on the right side, and 2+ on the left side.  Pulmonary/Chest: Effort normal and breath sounds normal. No respiratory distress. She has no wheezes. She has no rales.  Abdominal: Soft. Bowel sounds are normal. She exhibits no distension and no mass. There is no tenderness. There is no rebound and no guarding.    Musculoskeletal: Normal range of motion. She exhibits no edema.  Lymphadenopathy:    She has no cervical adenopathy.  Neurological: She is alert and oriented to person, place, and time.       CN grossly intact, station and gait intact  Skin: Skin is warm and dry. No rash noted.  Psychiatric: She has a normal mood and affect. Her behavior is normal. Judgment and thought content normal.      Assessment & Plan:

## 2011-05-28 NOTE — Progress Notes (Signed)
Addended by: Josph Macho A on: 05/28/2011 09:42 AM   Modules accepted: Orders

## 2011-05-28 NOTE — Assessment & Plan Note (Signed)
Reviewed #s, discussed goals.  Likely LDL <100, rec low chol diet.  Provided with handout.

## 2011-09-10 ENCOUNTER — Other Ambulatory Visit: Payer: Self-pay | Admitting: Family Medicine

## 2011-10-29 ENCOUNTER — Telehealth: Payer: Self-pay | Admitting: Family Medicine
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
## 2011-10-30 ENCOUNTER — Encounter (HOSPITAL_COMMUNITY): Payer: Self-pay | Admitting: Emergency Medicine

## 2011-10-30 ENCOUNTER — Emergency Department (HOSPITAL_COMMUNITY)
Admission: EM | Admit: 2011-10-30 | Discharge: 2011-10-30 | Disposition: A | Discharge status: Home / Self Care | Payer: BC Managed Care – PPO | Attending: Emergency Medicine | Admitting: Emergency Medicine

## 2011-10-30 ENCOUNTER — Emergency Department (HOSPITAL_COMMUNITY): Payer: BC Managed Care – PPO

## 2011-10-30 DIAGNOSIS — G4700 Insomnia, unspecified: Secondary | ICD-10-CM | POA: Insufficient documentation

## 2011-10-30 DIAGNOSIS — K219 Gastro-esophageal reflux disease without esophagitis: Secondary | ICD-10-CM | POA: Insufficient documentation

## 2011-10-30 DIAGNOSIS — F3289 Other specified depressive episodes: Secondary | ICD-10-CM | POA: Insufficient documentation

## 2011-10-30 DIAGNOSIS — I10 Essential (primary) hypertension: Secondary | ICD-10-CM | POA: Insufficient documentation

## 2011-10-30 DIAGNOSIS — M129 Arthropathy, unspecified: Secondary | ICD-10-CM | POA: Insufficient documentation

## 2011-10-30 DIAGNOSIS — G4733 Obstructive sleep apnea (adult) (pediatric): Secondary | ICD-10-CM | POA: Insufficient documentation

## 2011-10-30 DIAGNOSIS — F411 Generalized anxiety disorder: Secondary | ICD-10-CM | POA: Insufficient documentation

## 2011-10-30 DIAGNOSIS — Z886 Allergy status to analgesic agent status: Secondary | ICD-10-CM | POA: Insufficient documentation

## 2011-10-30 DIAGNOSIS — F329 Major depressive disorder, single episode, unspecified: Secondary | ICD-10-CM | POA: Insufficient documentation

## 2011-10-30 DIAGNOSIS — S20219A Contusion of unspecified front wall of thorax, initial encounter: Secondary | ICD-10-CM

## 2011-10-30 DIAGNOSIS — Z79899 Other long term (current) drug therapy: Secondary | ICD-10-CM | POA: Insufficient documentation

## 2011-10-30 DIAGNOSIS — S20229A Contusion of unspecified back wall of thorax, initial encounter: Secondary | ICD-10-CM | POA: Insufficient documentation

## 2011-10-30 MED ORDER — OXYCODONE-ACETAMINOPHEN 5-325 MG PO TABS: 1.0000 | ORAL_TABLET | Freq: Four times a day (QID) | ORAL | Status: DC | PRN

## 2011-10-30 NOTE — Telephone Encounter (Signed)
Can we call for update on how she's doing?  Thanks.
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
## 2011-10-30 NOTE — Telephone Encounter (Signed)
Attempted to call patient. Unable to leave message due to "calling restrictions" on her phone #. Will try again later.
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
## 2011-10-31 NOTE — Telephone Encounter (Signed)
Calling restrictions still on phone. According to records, patient went to ER for eval yesterday.

## 2011-10-31 NOTE — Telephone Encounter (Signed)
Noted.  Records reviewed. 

## 2011-12-30 ENCOUNTER — Other Ambulatory Visit: Payer: Self-pay | Admitting: Family Medicine

## 2012-01-15 ENCOUNTER — Other Ambulatory Visit: Payer: Self-pay | Admitting: *Deleted

## 2012-01-15 MED ORDER — LABETALOL HCL 100 MG PO TABS: 100.0000 mg | ORAL_TABLET | Freq: Two times a day (BID) | ORAL | Status: DC

## 2012-05-16 ENCOUNTER — Other Ambulatory Visit: Payer: Self-pay | Admitting: Cardiology

## 2012-07-13 ENCOUNTER — Other Ambulatory Visit: Payer: Self-pay | Admitting: Family Medicine

## 2012-07-22 ENCOUNTER — Other Ambulatory Visit: Payer: Self-pay | Admitting: Cardiology

## 2012-09-24 ENCOUNTER — Other Ambulatory Visit: Payer: Self-pay | Admitting: Family Medicine

## 2012-10-21 ENCOUNTER — Other Ambulatory Visit: Payer: Self-pay | Admitting: Family Medicine

## 2012-11-30 ENCOUNTER — Other Ambulatory Visit: Payer: Self-pay | Admitting: Family Medicine

## 2012-12-11 HISTORY — PX: ESOPHAGOGASTRODUODENOSCOPY: SHX1529

## 2012-12-29 ENCOUNTER — Other Ambulatory Visit: Payer: Self-pay | Admitting: Gastroenterology

## 2013-02-15 ENCOUNTER — Encounter: Payer: Self-pay | Admitting: Family Medicine

## 2013-04-06 DIAGNOSIS — R112 Nausea with vomiting, unspecified: Secondary | ICD-10-CM

## 2013-04-06 DIAGNOSIS — K8010 Calculus of gallbladder with chronic cholecystitis without obstruction: Secondary | ICD-10-CM

## 2013-04-06 DIAGNOSIS — R1011 Right upper quadrant pain: Secondary | ICD-10-CM

## 2013-04-06 DIAGNOSIS — K8020 Calculus of gallbladder without cholecystitis without obstruction: Secondary | ICD-10-CM

## 2013-05-11 DIAGNOSIS — K8020 Calculus of gallbladder without cholecystitis without obstruction: Secondary | ICD-10-CM

## 2013-05-11 HISTORY — DX: Calculus of gallbladder without cholecystitis without obstruction: K80.20

## 2013-05-23 ENCOUNTER — Encounter (HOSPITAL_COMMUNITY): Payer: Self-pay | Admitting: Emergency Medicine

## 2013-05-23 ENCOUNTER — Emergency Department (HOSPITAL_COMMUNITY)
Admission: EM | Admit: 2013-05-23 | Discharge: 2013-05-24 | Disposition: A | Discharge status: Home / Self Care | Payer: BC Managed Care – PPO | Attending: Emergency Medicine | Admitting: Emergency Medicine

## 2013-05-23 DIAGNOSIS — R1011 Right upper quadrant pain: Secondary | ICD-10-CM

## 2013-05-23 DIAGNOSIS — Z8739 Personal history of other diseases of the musculoskeletal system and connective tissue: Secondary | ICD-10-CM | POA: Insufficient documentation

## 2013-05-23 DIAGNOSIS — K219 Gastro-esophageal reflux disease without esophagitis: Secondary | ICD-10-CM | POA: Insufficient documentation

## 2013-05-23 DIAGNOSIS — K8050 Calculus of bile duct without cholangitis or cholecystitis without obstruction: Secondary | ICD-10-CM

## 2013-05-23 DIAGNOSIS — G43909 Migraine, unspecified, not intractable, without status migrainosus: Secondary | ICD-10-CM | POA: Insufficient documentation

## 2013-05-23 DIAGNOSIS — Z79899 Other long term (current) drug therapy: Secondary | ICD-10-CM | POA: Insufficient documentation

## 2013-05-23 DIAGNOSIS — K8020 Calculus of gallbladder without cholecystitis without obstruction: Secondary | ICD-10-CM

## 2013-05-23 DIAGNOSIS — F411 Generalized anxiety disorder: Secondary | ICD-10-CM | POA: Insufficient documentation

## 2013-05-23 DIAGNOSIS — F329 Major depressive disorder, single episode, unspecified: Secondary | ICD-10-CM | POA: Insufficient documentation

## 2013-05-23 DIAGNOSIS — F3289 Other specified depressive episodes: Secondary | ICD-10-CM | POA: Insufficient documentation

## 2013-05-23 DIAGNOSIS — Z3202 Encounter for pregnancy test, result negative: Secondary | ICD-10-CM | POA: Insufficient documentation

## 2013-05-23 DIAGNOSIS — I10 Essential (primary) hypertension: Secondary | ICD-10-CM | POA: Insufficient documentation

## 2013-05-23 DIAGNOSIS — Z8619 Personal history of other infectious and parasitic diseases: Secondary | ICD-10-CM | POA: Insufficient documentation

## 2013-05-23 DIAGNOSIS — R112 Nausea with vomiting, unspecified: Secondary | ICD-10-CM

## 2013-05-23 LAB — CBC WITH DIFFERENTIAL/PLATELET
BASOS ABS: 0 10*3/uL (ref 0.0–0.1)
Basophils Relative: 0 % (ref 0–1)
EOS ABS: 0 10*3/uL (ref 0.0–0.7)
EOS PCT: 0 % (ref 0–5)
HEMATOCRIT: 42.5 % (ref 36.0–46.0)
Hemoglobin: 14.5 g/dL (ref 12.0–15.0)
LYMPHS PCT: 6 % — AB (ref 12–46)
Lymphs Abs: 0.7 10*3/uL (ref 0.7–4.0)
MCH: 28.4 pg (ref 26.0–34.0)
MCHC: 34.1 g/dL (ref 30.0–36.0)
MCV: 83.2 fL (ref 78.0–100.0)
MONO ABS: 0.2 10*3/uL (ref 0.1–1.0)
Monocytes Relative: 2 % — ABNORMAL LOW (ref 3–12)
Neutro Abs: 11 10*3/uL — ABNORMAL HIGH (ref 1.7–7.7)
Neutrophils Relative %: 92 % — ABNORMAL HIGH (ref 43–77)
PLATELETS: 315 10*3/uL (ref 150–400)
RBC: 5.11 MIL/uL (ref 3.87–5.11)
RDW: 13.6 % (ref 11.5–15.5)
WBC: 11.9 10*3/uL — ABNORMAL HIGH (ref 4.0–10.5)

## 2013-05-23 LAB — LIPASE, BLOOD: Lipase: 24 U/L (ref 11–59)

## 2013-05-23 LAB — COMPREHENSIVE METABOLIC PANEL
ALT: 13 U/L (ref 0–35)
AST: 21 U/L (ref 0–37)
Albumin: 4.7 g/dL (ref 3.5–5.2)
Alkaline Phosphatase: 110 U/L (ref 39–117)
BUN: 13 mg/dL (ref 6–23)
CALCIUM: 9.8 mg/dL (ref 8.4–10.5)
CO2: 22 meq/L (ref 19–32)
CREATININE: 0.63 mg/dL (ref 0.50–1.10)
Chloride: 98 mEq/L (ref 96–112)
GLUCOSE: 178 mg/dL — AB (ref 70–99)
Potassium: 3.8 mEq/L (ref 3.7–5.3)
SODIUM: 136 meq/L — AB (ref 137–147)
TOTAL PROTEIN: 8.2 g/dL (ref 6.0–8.3)
Total Bilirubin: 1.3 mg/dL — ABNORMAL HIGH (ref 0.3–1.2)

## 2013-05-23 LAB — URINE MICROSCOPIC-ADD ON

## 2013-05-23 LAB — URINALYSIS, ROUTINE W REFLEX MICROSCOPIC
BILIRUBIN URINE: NEGATIVE
Glucose, UA: 100 mg/dL — AB
Hgb urine dipstick: NEGATIVE
KETONES UR: NEGATIVE mg/dL
LEUKOCYTES UA: NEGATIVE
NITRITE: NEGATIVE
PROTEIN: 100 mg/dL — AB
Specific Gravity, Urine: 1.028 (ref 1.005–1.030)
UROBILINOGEN UA: 0.2 mg/dL (ref 0.0–1.0)
pH: 7 (ref 5.0–8.0)

## 2013-05-23 LAB — POC URINE PREG, ED: Preg Test, Ur: NEGATIVE

## 2013-05-23 LAB — I-STAT TROPONIN, ED: TROPONIN I, POC: 0 ng/mL (ref 0.00–0.08)

## 2013-05-23 MED ORDER — GI COCKTAIL ~~LOC~~
30.0000 mL | Freq: Once | ORAL | Status: AC
  Administered 2013-05-23: 30 mL via ORAL
  Filled 2013-05-23: qty 30

## 2013-05-23 MED ORDER — ONDANSETRON HCL 4 MG/2ML IJ SOLN
4.0000 mg | Freq: Once | INTRAMUSCULAR | Status: AC
  Administered 2013-05-23: 4 mg via INTRAVENOUS
  Filled 2013-05-23: qty 2

## 2013-05-23 MED ORDER — FENTANYL CITRATE 0.05 MG/ML IJ SOLN
50.0000 ug | Freq: Once | INTRAMUSCULAR | Status: AC
  Administered 2013-05-23: 50 ug via INTRAVENOUS
  Filled 2013-05-23: qty 2

## 2013-05-23 MED ORDER — SODIUM CHLORIDE 0.9 % IV SOLN
Freq: Once | INTRAVENOUS | Status: AC
  Administered 2013-05-23: 22:00:00 via INTRAVENOUS

## 2013-05-23 MED ORDER — PROMETHAZINE HCL 25 MG/ML IJ SOLN
25.0000 mg | Freq: Once | INTRAMUSCULAR | Status: AC
  Administered 2013-05-23: 25 mg via INTRAVENOUS
  Filled 2013-05-23: qty 1

## 2013-05-23 NOTE — ED Notes (Signed)
Patient is alert and oriented x3.  She is complaining of right abdominal pain that  Started this morning with nausea and vomiting.  Last vomiting was 16:00.  She denies Any issues with kidney stones or gallbladder issues.  Currently she rates her pain 8 of 10.

## 2013-05-23 NOTE — ED Provider Notes (Signed)
CSN: [id number]     Arrival date & time 05/23/13  1953 History   First MD Initiated Contact with Patient 05/23/13 2234     Chief Complaint  Patient presents with  . Abdominal Pain     (Consider location/radiation/quality/duration/timing/severity/associated sxs/prior Treatment) HPI Pt is a 48yo female c/o abdominal pain associated with nausea, vomiting, and loose stools. Pt states symptoms started about 8 days ago but went away on their own.  Today, she started to experience RUQ pain that was stabbing, intermittent, radiating into epigastric region, 8/10, associated with nausea and vomiting. Pt reports >20 episodes of non-bilious, non-bloody emesis. Reports loose stools but no blood. Pt states she does have GERD which she takes medication for, and pain does feel similar, however, she has never had nausea or vomiting this bad.  Denies urinary or vaginal symptoms.  Denies hx of kidney stones or gallbladder issues. Abdominal surgery significant for c-section, otherwise unremarkable.   Denies fever, sick contacts, or recent travel.   Past Medical History  Diagnosis Date  . Migraine   . Hypertension   . Anxiety and depression     mainly anxiety, Dr. Westley ChandlerKarr  . Insomnia   . Arthritis   . GERD (gastroesophageal reflux disease)     chronic gastritis on EGD 12/2012  . Seasonal allergies   . History of chicken pox   . OSA (obstructive sleep apnea)     trouble using CPAP   Past Surgical History  Procedure Laterality Date  . Cesarean section  2003  . Wisdom tooth extraction  1998  . Cervical spine surgery  05/03/2010    with fusion for herniated disc  . Breast reduction surgery  12/2010  . Esophagogastroduodenoscopy  12/2012    mild chronic gastritis, rec dexilant Bing Matter(Ganem Eagle)   Family History  Problem Relation Age of Onset  . Coronary artery disease Paternal Grandfather 4358    MI  . Arthritis Father   . Coronary artery disease Father 3253    MI  . Arthritis Mother   . Arthritis Maternal  Grandfather   . Arthritis Maternal Grandmother   . Migraines Maternal Grandmother   . Migraines Sister   . Coronary artery disease Paternal Uncle   . Diabetes Neg Hx   . Stroke Neg Hx   . Cancer Neg Hx    History  Substance Use Topics  . Smoking status: Never Smoker   . Smokeless tobacco: Never Used  . Alcohol Use: Yes     Comment: social   OB History   Grav Para Term Preterm Abortions TAB SAB Ect Mult Living                 Review of Systems  Constitutional: Negative for fever and chills.  Gastrointestinal: Positive for nausea, vomiting and abdominal pain ( RUQ and epigastrium). Negative for diarrhea and constipation.  All other systems reviewed and are negative.     Allergies  Morphine and related  Home Medications   Current Outpatient Rx  Name  Route  Sig  Dispense  Refill  . amphetamine-dextroamphetamine (ADDERALL) 30 MG tablet   Oral   Take 30 mg by mouth 2 (two) times daily.         . clonazePAM (KLONOPIN) 1 MG tablet   Oral   Take 1 mg by mouth 4 (four) times daily as needed. For anxiety         . dexlansoprazole (DEXILANT) 60 MG capsule   Oral   Take 60 mg by  mouth daily.         Marland Kitchen. labetalol (NORMODYNE) 100 MG tablet      Take 1 tablet 2 (two) times daily. **MUST HAVE FOLLOW UP FOR FURTHER REFILLS**   60 tablet   0   . venlafaxine XR (EFFEXOR-XR) 75 MG 24 hr capsule   Oral   Take 225 mg by mouth daily with breakfast.          BP 147/86  Pulse 64  Temp(Src) 97.6 F (36.4 C) (Oral)  Resp 14  SpO2 98% Physical Exam  Nursing note and vitals reviewed. Constitutional: She appears well-developed and well-nourished.  Mildly uncomfortable, rubbing right side of abdomen.  HENT:  Head: Normocephalic and atraumatic.  Eyes: Conjunctivae are normal. No scleral icterus.  Neck: Normal range of motion.  Cardiovascular: Normal rate, regular rhythm and normal heart sounds.   Pulmonary/Chest: Effort normal and breath sounds normal. No respiratory  distress. She has no wheezes. She has no rales. She exhibits no tenderness.  Abdominal: Soft. Bowel sounds are normal. She exhibits no distension and no mass. There is tenderness ( RUQ and epigastrium). There is no rebound and no guarding.  Soft, non-distended. Tenderness in RUQ and epigastrium w/o rebound, guarding, or masses.  Musculoskeletal: Normal range of motion.  Neurological: She is alert.  Skin: Skin is warm and dry. She is not diaphoretic.    ED Course  Procedures (including critical care time) Labs Review Labs Reviewed  CBC WITH DIFFERENTIAL - Abnormal; Notable for the following:    WBC 11.9 (*)    Neutrophils Relative % 92 (*)    Neutro Abs 11.0 (*)    Lymphocytes Relative 6 (*)    Monocytes Relative 2 (*)    All other components within normal limits  COMPREHENSIVE METABOLIC PANEL - Abnormal; Notable for the following:    Sodium 136 (*)    Glucose, Bld 178 (*)    Total Bilirubin 1.3 (*)    All other components within normal limits  URINALYSIS, ROUTINE W REFLEX MICROSCOPIC - Abnormal; Notable for the following:    Glucose, UA 100 (*)    Protein, ur 100 (*)    All other components within normal limits  URINE MICROSCOPIC-ADD ON - Abnormal; Notable for the following:    Bacteria, UA FEW (*)    All other components within normal limits  LIPASE, BLOOD  I-STAT TROPOININ, ED  POC URINE PREG, ED   Imaging Review No results found.   EKG Interpretation None      MDM   Final diagnoses:  None    Pt is a 48yo female c/o RUQ and epigastric pain associated with nausea and vomiting. Reports similar episode about 8 days ago that resolved on its own.  On exam, pt is afebrile, appears mildly uncomfortable.  Abd- soft, non-distended. Tender in RUQ and epigastrium w/o rebound, guarding, or masses.  Labs- CBC slight elevation in WBC-11.9, CMP, lipase, and UA otherwise unremarkable.   Discussed pt with Dr. Wilkie AyeHorton, will get U/S to r/o cholelithiasis.    1:15 AM- pt states pain  and nausea have improved.  U/S still pending.   1:43 AM Pt signed out to TRW AutomotiveKelly Humes, PA-C at shift change. Plan is to discharge pt home with GI f/u pending U/S.  Discharge with pain and nausea medications with return precautions.   Junius FinnerErin O'Malley, PA-C 05/24/13 [phone]

## 2013-05-24 ENCOUNTER — Emergency Department (HOSPITAL_COMMUNITY): Payer: BC Managed Care – PPO

## 2013-05-24 ENCOUNTER — Encounter: Payer: Self-pay | Admitting: Family Medicine

## 2013-05-24 MED ORDER — ONDANSETRON HCL 4 MG PO TABS: 4.0000 mg | ORAL_TABLET | Freq: Four times a day (QID) | ORAL | Status: DC

## 2013-05-24 MED ORDER — HYDROCODONE-ACETAMINOPHEN 5-325 MG PO TABS: 1.0000 | ORAL_TABLET | Freq: Four times a day (QID) | ORAL | Status: DC | PRN

## 2013-05-24 NOTE — ED Notes (Signed)
Ultrasound of abdomen in process at bedside.

## 2013-05-24 NOTE — Discharge Instructions (Signed)
Biliary Colic  °Biliary colic is a steady or irregular pain in the upper abdomen. It is usually under the right side of the rib cage. It happens when gallstones interfere with the normal flow of bile from the gallbladder. Bile is a liquid that helps to digest fats. Bile is made in the liver and stored in the gallbladder. When you eat a meal, bile passes from the gallbladder through the cystic duct and the common bile duct into the small intestine. There, it mixes with partially digested food. If a gallstone blocks either of these ducts, the normal flow of bile is blocked. The muscle cells in the bile duct contract forcefully to try to move the stone. This causes the pain of biliary colic.  °SYMPTOMS  °· A person with biliary colic usually complains of pain in the upper abdomen. This pain can be: °· In the center of the upper abdomen just below the breastbone. °· In the upper-right part of the abdomen, near the gallbladder and liver. °· Spread back toward the right shoulder blade. °· Nausea and vomiting. °· The pain usually occurs after eating. °· Biliary colic is usually triggered by the digestive system's demand for bile. The demand for bile is high after fatty meals. Symptoms can also occur when a person who has been fasting suddenly eats a very large meal. Most episodes of biliary colic pass after 1 to 5 hours. After the most intense pain passes, your abdomen may continue to ache mildly for about 24 hours. °DIAGNOSIS  °After you describe your symptoms, your caregiver will perform a physical exam. He or she will pay attention to the upper right portion of your belly (abdomen). This is the area of your liver and gallbladder. An ultrasound will help your caregiver look for gallstones. Specialized scans of the gallbladder may also be done. Blood tests may be done, especially if you have fever or if your pain persists. °PREVENTION  °Biliary colic can be prevented by controlling the risk factors for gallstones. Some of  these risk factors, such as heredity, increasing age, and pregnancy are a normal part of life. Obesity and a high-fat diet are risk factors you can change through a healthy lifestyle. Women going through menopause who take hormone replacement therapy (estrogen) are also more likely to develop biliary colic. °TREATMENT  °· Pain medication may be prescribed. °· You may be encouraged to eat a fat-free diet. °· If the first episode of biliary colic is severe, or episodes of colic keep retuning, surgery to remove the gallbladder (cholecystectomy) is usually recommended. This procedure can be done through small incisions using an instrument called a laparoscope. The procedure often requires a brief stay in the hospital. Some people can leave the hospital the same day. It is the most widely used treatment in people troubled by painful gallstones. It is effective and safe, with no complications in more than 90% of cases. °· If surgery cannot be done, medication that dissolves gallstones may be used. This medication is expensive and can take months or years to work. Only small stones will dissolve. °· Rarely, medication to dissolve gallstones is combined with a procedure called shock-wave lithotripsy. This procedure uses carefully aimed shock waves to break up gallstones. In many people treated with this procedure, gallstones form again within a few years. °PROGNOSIS  °If gallstones block your cystic duct or common bile duct, you are at risk for repeated episodes of biliary colic. There is also a 25% chance that you will develop   a gallbladder infection(acute cholecystitis), or some other complication of gallstones within 10 to 20 years. If you have surgery, schedule it at a time that is convenient for you and at a time when you are not sick. HOME CARE INSTRUCTIONS   Drink plenty of clear fluids.  Avoid fatty, greasy or fried foods, or any foods that make your pain worse.  Take medications as directed. SEEK MEDICAL  CARE IF:   You develop a fever over 100.5 F (38.1 C).  Your pain gets worse over time.  You develop nausea that prevents you from eating and drinking.  You develop vomiting. SEEK IMMEDIATE MEDICAL CARE IF:   You have continuous or severe belly (abdominal) pain which is not relieved with medications.  You develop nausea and vomiting which is not relieved with medications.  You have symptoms of biliary colic and you suddenly develop a fever and shaking chills. This may signal cholecystitis. Call your caregiver immediately.  You develop a yellow color to your skin or the white part of your eyes (jaundice). Document Released: 06/30/2005 Document Revised: 04/21/2011 Document Reviewed: 09/09/2007 [facility] Patient Information 2014 [place], [place]. Cholelithiasis Cholelithiasis (also called gallstones) is a form of gallbladder disease. The gallbladder is a small organ that helps you digest fats. Symptoms of gallstones are:  Feeling sick to your stomach (nausea).  Throwing up (vomiting).  Belly pain.  Yellowing of the skin (jaundice).  Sudden pain. You may feel the pain for minutes to hours.  Fever.  Pain to the touch. HOME CARE  Only take medicines as told by your doctor.  Eat a low-fat diet until you see your doctor again. Eating fat can result in pain.  Follow up with your doctor as told. Attacks usually happen time after time. Surgery is usually needed for permanent treatment. GET HELP RIGHT AWAY IF:   Your pain gets worse.  Your pain is not helped by medicines.  You have a fever and lasting symptoms for more than 2 3 days.  You have a fever and your symptoms suddenly get worse.  You keep feeling sick to your stomach and throwing up. MAKE SURE YOU:   Understand these instructions.  Will watch your condition.  Will get help right away if you are not doing well or get worse. Document Released: 07/16/2007 Document Revised: 09/29/2012 Document Reviewed:  07/21/2012 [facility] Patient Information 2014 [place], [place].

## 2013-05-24 NOTE — ED Provider Notes (Signed)
Patient care assumed from [facility]CErin O'Malley, PA-C at shift change. Patient presents for vomiting and RUQ abdominal pain. Abdominal U/S pending. Plan discussed with Mirian Capuchin'Malley, PA-C which includes disposition based on imaging results; patient stable for d/c if no emergent findings.  0200 - Abdominal ultrasound findings consistent with cholelithiasis. No evidence of acute cholecystitis on imaging. Patient without sonographic Murphy sign. LFTs within normal limits. No leukocytosis. Patient stable and appropriate for discharge with instruction to followup with her primary care provider. Will prescribe pain and nausea medicine for symptoms should biliary colic persist. Return precautions discussed with patient who verbalizes comfort and understanding with this discharge plan with no unaddressed concerns.   Filed Vitals:   05/23/13 2037  BP: 147/86  Pulse: 64  Temp: 97.6 F (36.4 C)  TempSrc: Oral  Resp: 14  SpO2: 98%   [place] Abdomen Complete  05/24/2013   CLINICAL DATA:  Abdominal pain, nausea  EXAM: ULTRASOUND ABDOMEN COMPLETE  COMPARISON:  None.  FINDINGS: Gallbladder:  There are cholelithiasis. There is no pericholecystic fluid or gallbladder wall thickening. Negative sonographic Murphy sign.  Common bile duct:  Diameter: 5.2 mm  Liver:  No focal lesion identified. Within normal limits in parenchymal echogenicity.  IVC:  No abnormality visualized.  Pancreas:  Limited in evaluation secondary to overlying bowel gas.  Spleen:  Size and appearance within normal limits.  Right Kidney:  Length: 10.4 cm. Echogenicity within normal limits. No mass or hydronephrosis visualized.  Left Kidney:  Length: 11.1 cm. Echogenicity within normal limits. No mass or hydronephrosis visualized.  Abdominal aorta:  No aneurysm visualized.  Other findings:  None.  IMPRESSION: 1. Cholelithiasis without sonographic evidence of acute cholecystitis.   Electronically Signed   By: Elige KoHetal  Patel   On: 05/24/2013 01:55      Antony MaduraKelly Pollyanna Levay,  PA-C 05/24/13 0205

## 2013-05-25 NOTE — ED Provider Notes (Signed)
Medical screening examination/treatment/procedure(s) were performed by non-physician practitioner and as supervising physician I was immediately available for consultation/collaboration.   EKG Interpretation None       Sina Lucchesi F Quention Mcneill, MD 05/25/13 0440 

## 2013-05-25 NOTE — ED Provider Notes (Signed)
Medical screening examination/treatment/procedure(s) were performed by non-physician practitioner and as supervising physician I was immediately available for consultation/collaboration.   EKG Interpretation None        Tarvares Lant F Vergie Zahm, MD 05/25/13 0439 

## 2013-06-09 ENCOUNTER — Encounter (INDEPENDENT_AMBULATORY_CARE_PROVIDER_SITE_OTHER): Payer: Self-pay | Admitting: Surgery

## 2013-06-10 ENCOUNTER — Encounter (INDEPENDENT_AMBULATORY_CARE_PROVIDER_SITE_OTHER): Payer: Self-pay | Admitting: Surgery

## 2013-06-10 ENCOUNTER — Ambulatory Visit (INDEPENDENT_AMBULATORY_CARE_PROVIDER_SITE_OTHER): Payer: BC Managed Care – PPO | Admitting: Surgery

## 2013-06-10 VITALS — BP 130/81 | HR 77 | Temp 98.6°F | Resp 18 | Ht 64.0 in | Wt 178.6 lb

## 2013-06-10 DIAGNOSIS — K8010 Calculus of gallbladder with chronic cholecystitis without obstruction: Secondary | ICD-10-CM

## 2013-06-10 NOTE — Progress Notes (Signed)
Patient ID: Shelly Brown, female   DOB: November 09, 1965, 48 y.o.   MRN: [id number]  Chief Complaint  Patient presents with  . Abdominal Pain    gallbladder    HPI Shelly Brown is a 48 y.o. female.  Referred by Dr. Wandalee FerdinandSam Ganem for evaluation of gallstones  Abdominal Pain Associated symptoms: diarrhea, nausea and vomiting   Associated symptoms: no chest pain, no chills, no constipation, no cough, no fever, no hematuria, no sore throat and no vaginal bleeding    This is a 48 yo female who presents with a two-month history of intermittent severe right upper quadrant abdominal pain associated with nausea, vomiting, diarrhea, and abdominal bloating. This does occur after eating spicy foods. She had one visit to the emergency department where she was ruled out for cardiac disease. Ultrasound showed a 1.3 cm gallstone. Liver function tests showed a bilirubin of 1.3 but were otherwise normal. She had a recent endoscopy that only showed some mild gastritis but otherwise no sign of ulcer disease. She presents now for evaluation for cholecystectomy. Past Medical History  Diagnosis Date  . Migraine   . Hypertension   . Anxiety and depression     mainly anxiety, Dr. Westley ChandlerKarr  . Insomnia   . Arthritis   . GERD (gastroesophageal reflux disease)     chronic gastritis on EGD 12/2012  . Seasonal allergies   . History of chicken pox   . OSA (obstructive sleep apnea)     trouble using CPAP  . Cholelithiasis 05/2013  . Hyperlipidemia     Past Surgical History  Procedure Laterality Date  . Cesarean section  2003  . Wisdom tooth extraction  1998  . Cervical spine surgery  05/03/2010    with fusion for herniated disc  . Breast reduction surgery  12/2010  . Esophagogastroduodenoscopy  12/2012    mild chronic gastritis, rec dexilant Bing Matter(Ganem Eagle)    Family History  Problem Relation Age of Onset  . Coronary artery disease Paternal Grandfather [phone]    MI  . Arthritis Father   . Coronary artery disease Father [phone]     MI  . Arthritis Mother   . Arthritis Maternal Grandfather   . Arthritis Maternal Grandmother   . Migraines Maternal Grandmother   . Migraines Sister   . Coronary artery disease Paternal Uncle   . Diabetes Neg Hx   . Stroke Neg Hx   . Cancer Neg Hx     Social History History  Substance Use Topics  . Smoking status: Never Smoker   . Smokeless tobacco: Never Used  . Alcohol Use: Yes     Comment: social    Allergies  Allergen Reactions  . Morphine And Related Other (See Comments)    Hallucinations    Current Outpatient Prescriptions  Medication Sig Dispense Refill  . amphetamine-dextroamphetamine (ADDERALL) 30 MG tablet Take 30 mg by mouth 2 (two) times daily.      . clonazePAM (KLONOPIN) 1 MG tablet Take 1 mg by mouth 4 (four) times daily as needed. For anxiety      . dexlansoprazole (DEXILANT) 60 MG capsule Take 60 mg by mouth daily.      Marland Kitchen. labetalol (NORMODYNE) 100 MG tablet Take 1 tablet 2 (two) times daily. **MUST HAVE FOLLOW UP FOR FURTHER REFILLS**  60 tablet  0  . ondansetron (ZOFRAN) 4 MG tablet Take 1 tablet (4 mg total) by mouth every 6 (six) hours.  12 tablet  0  . venlafaxine XR (EFFEXOR-XR) 75 MG  24 hr capsule Take 225 mg by mouth daily with breakfast.      . HYDROcodone-acetaminophen (NORCO/VICODIN) 5-325 MG per tablet Take 1-2 tablets by mouth every 6 (six) hours as needed.  17 tablet  0   No current facility-administered medications for this visit.    Review of Systems Review of Systems  Constitutional: Negative for fever, chills and unexpected weight change.  HENT: Negative for congestion, hearing loss, sore throat, trouble swallowing and voice change.   Eyes: Negative for visual disturbance.  Respiratory: Negative for cough and wheezing.   Cardiovascular: Negative for chest pain, palpitations and leg swelling.  Gastrointestinal: Positive for nausea, vomiting, abdominal pain, diarrhea and abdominal distention. Negative for constipation, blood in stool  and anal bleeding.  Genitourinary: Negative for hematuria, vaginal bleeding and difficulty urinating.  Musculoskeletal: Negative for arthralgias.  Skin: Negative for rash and wound.  Neurological: Negative for seizures, syncope and headaches.  Hematological: Negative for adenopathy. Does not bruise/bleed easily.  Psychiatric/Behavioral: Negative for confusion.    Blood pressure 130/81, pulse 77, temperature 98.6 F (37 C), temperature source Temporal, resp. rate 18, height 5\' 4"  (1.626 m), weight 178 lb 9.6 oz (81.012 kg).  Physical Exam Physical Exam WDWN in NAD HEENT:  EOMI, sclera anicteric Neck:  No masses, no thyromegaly Lungs:  CTA bilaterally; normal respiratory effort CV:  Regular rate and rhythm; no murmurs Abd:  +bowel sounds, soft, mild RUQ tenderness; no masses Ext:  Well-perfused; no edema Skin:  Warm, dry; no sign of jaundice  Data Reviewed [place] Abdomen Complete  05/24/2013   CLINICAL DATA:  Abdominal pain, nausea  EXAM: ULTRASOUND ABDOMEN COMPLETE  COMPARISON:  None.  FINDINGS: Gallbladder:  There are cholelithiasis. There is no pericholecystic fluid or gallbladder wall thickening. Negative sonographic Murphy sign.  Common bile duct:  Diameter: 5.2 mm  Liver:  No focal lesion identified. Within normal limits in parenchymal echogenicity.  IVC:  No abnormality visualized.  Pancreas:  Limited in evaluation secondary to overlying bowel gas.  Spleen:  Size and appearance within normal limits.  Right Kidney:  Length: 10.4 cm. Echogenicity within normal limits. No mass or hydronephrosis visualized.  Left Kidney:  Length: 11.1 cm. Echogenicity within normal limits. No mass or hydronephrosis visualized.  Abdominal aorta:  No aneurysm visualized.  Other findings:  None.  IMPRESSION: 1. Cholelithiasis without sonographic evidence of acute cholecystitis.   Electronically Signed   By: Elige KoHetal  Patel   On: 05/24/2013 01:55    Lab Results  Component Value Date   WBC 11.9* 05/23/2013   HGB  14.5 05/23/2013   HCT 42.5 05/23/2013   MCV 83.2 05/23/2013   PLT 315 05/23/2013   Lab Results  Component Value Date   CREATININE 0.63 05/23/2013   BUN 13 05/23/2013   NA 136* 05/23/2013   K 3.8 05/23/2013   CL 98 05/23/2013   CO2 22 05/23/2013   Lab Results  Component Value Date   ALT 13 05/23/2013   AST 21 05/23/2013   ALKPHOS 110 05/23/2013   BILITOT 1.3* 05/23/2013     Assessment    Chronic calculus cholecystitis     Plan    Laparoscopic cholecystectomy with intraoperative cholangiogram.  The surgical procedure has been discussed with the patient.  Potential risks, benefits, alternative treatments, and expected outcomes have been explained.  All of the patient's questions at this time have been answered.  The likelihood of reaching the patient's treatment goal is good.  The patient understand the proposed surgical procedure and  wishes to proceed.         Wilmon Arms. Iban Utz 06/10/2013, 11:07 AM

## 2013-06-24 ENCOUNTER — Encounter (HOSPITAL_COMMUNITY): Payer: Self-pay | Admitting: Pharmacy Technician

## 2013-06-29 ENCOUNTER — Ambulatory Visit (HOSPITAL_COMMUNITY)
Admission: RE | Admit: 2013-06-29 | Discharge: 2013-06-29 | Disposition: A | Discharge status: Home / Self Care | Payer: BC Managed Care – PPO | Source: Ambulatory Visit | Attending: Anesthesiology | Admitting: Anesthesiology

## 2013-06-29 ENCOUNTER — Encounter (HOSPITAL_COMMUNITY): Payer: Self-pay

## 2013-06-29 ENCOUNTER — Encounter (HOSPITAL_COMMUNITY)
Admission: RE | Admit: 2013-06-29 | Discharge: 2013-06-29 | Disposition: A | Discharge status: Home / Self Care | Payer: BC Managed Care – PPO | Source: Ambulatory Visit | Attending: Surgery | Admitting: Surgery

## 2013-06-29 DIAGNOSIS — Z01818 Encounter for other preprocedural examination: Secondary | ICD-10-CM | POA: Insufficient documentation

## 2013-06-29 DIAGNOSIS — Z01812 Encounter for preprocedural laboratory examination: Secondary | ICD-10-CM | POA: Insufficient documentation

## 2013-06-29 DIAGNOSIS — Z01810 Encounter for preprocedural cardiovascular examination: Secondary | ICD-10-CM | POA: Insufficient documentation

## 2013-06-29 DIAGNOSIS — Z981 Arthrodesis status: Secondary | ICD-10-CM | POA: Insufficient documentation

## 2013-06-29 LAB — CBC
HCT: 43.3 % (ref 36.0–46.0)
Hemoglobin: 14.1 g/dL (ref 12.0–15.0)
MCH: 28 pg (ref 26.0–34.0)
MCHC: 32.6 g/dL (ref 30.0–36.0)
MCV: 85.9 fL (ref 78.0–100.0)
Platelets: 278 10*3/uL (ref 150–400)
RBC: 5.04 MIL/uL (ref 3.87–5.11)
RDW: 14.1 % (ref 11.5–15.5)
WBC: 6.4 10*3/uL (ref 4.0–10.5)

## 2013-06-29 LAB — BASIC METABOLIC PANEL
BUN: 12 mg/dL (ref 6–23)
CHLORIDE: 102 meq/L (ref 96–112)
CO2: 26 mEq/L (ref 19–32)
Calcium: 9.2 mg/dL (ref 8.4–10.5)
Creatinine, Ser: 0.82 mg/dL (ref 0.50–1.10)
GFR calc Af Amer: >90 mL/min (ref >90)
GFR calc non Af Amer: 85 mL/min — ABNORMAL LOW (ref >90)
GLUCOSE: 86 mg/dL (ref 70–99)
Potassium: 3.6 mEq/L — ABNORMAL LOW (ref 3.7–5.3)
SODIUM: 142 meq/L (ref 137–147)

## 2013-06-29 LAB — HCG, SERUM, QUALITATIVE: Preg, Serum: NEGATIVE

## 2013-06-29 NOTE — Pre-Procedure Instructions (Addendum)
Shelly Brown  06/29/2013   Your procedure is scheduled on:  07/07/13  [facility] at 800 AM.  Call this number if you have problems the morning of surgery: [phone]   Remember:   Do not eat food or drink liquids after midnight.   Take these medicines the morning of surgery with A SIP OF WATER: adderall. ,dexilant, labetalol, effexor, pain med if needed        Take all meds until day of surgery except as below pr per dr     Despina AriasSTOP all herbel meds, nsaids (aleve,naproxen,advil,ibuprofen) 5 days prior to surgery (07/02/13) including vitamins,aspirin   Do not wear jewelry, make-up or nail polish.  Do not wear lotions, powders, or perfumes. You may wear deodorant.  Do not shave 48 hours prior to surgery. Men may shave face and neck.  Do not bring valuables to the hospital.  [facility] is not responsible                  for any belongings or valuables.               Contacts, dentures or bridgework may not be worn into surgery.  Leave suitcase in the car. After surgery it may be brought to your room.  For patients admitted to the hospital, discharge time is determined by your                treatment team.               Patients discharged the day of surgery will not be allowed to drive  home.  Name and phone number of your driver:   Special Instructions:  Special Instructions:  - Preparing for Surgery  Before surgery, you can play an important role.  Because skin is not sterile, your skin needs to be as free of germs as possible.  You can reduce the number of germs on you skin by washing with CHG (chlorahexidine gluconate) soap before surgery.  CHG is an antiseptic cleaner which kills germs and bonds with the skin to continue killing germs even after washing.  Please DO NOT use if you have an allergy to CHG or antibacterial soaps.  If your skin becomes reddened/irritated stop using the CHG and inform your nurse when you arrive at Short Stay.  Do  not shave (including legs and underarms) for at least 48 hours prior to the first CHG shower.  You may shave your face.  Please follow these instructions carefully:   1.  Shower with CHG Soap the night before surgery and the morning of Surgery.  2.  If you choose to wash your hair, wash your hair first as usual with your normal shampoo.  3.  After you shampoo, rinse your hair and body thoroughly to remove the Shampoo.  4.  Use CHG as you would any other liquid soap.  You can apply chg directly  to the skin and wash gently with scrungie or a clean washcloth.  5.  Apply the CHG Soap to your body ONLY FROM THE NECK DOWN.  Do not use on open wounds or open sores.  Avoid contact with your eyes ears, mouth and genitals (private parts).  Wash genitals (private parts)       with your normal soap.  6.  Wash thoroughly, paying special attention to the area where your surgery will be performed.  7.  Thoroughly rinse your body with warm water from  the neck down.  8.  DO NOT shower/wash with your normal soap after using and rinsing off the CHG Soap.  9.  Pat yourself dry with a clean towel.            10.  Wear clean pajamas.            11.  Place clean sheets on your bed the night of your first shower and do not sleep with pets.  Day of Surgery  Do not apply any lotions/deodorants the morning of surgery.  Please wear clean clothes to the hospital/surgery center.   Please read over the following fact sheets that you were given: Pain Booklet, Coughing and Deep Breathing and Surgical Site Infection Prevention

## 2013-07-03 ENCOUNTER — Inpatient Hospital Stay (HOSPITAL_COMMUNITY)
Admission: EM | Admit: 2013-07-03 | Discharge: 2013-07-05 | DRG: 419 | Disposition: A | Discharge status: Home / Self Care | Payer: BC Managed Care – PPO | Attending: General Surgery | Admitting: General Surgery

## 2013-07-03 ENCOUNTER — Emergency Department (HOSPITAL_COMMUNITY): Payer: BC Managed Care – PPO

## 2013-07-03 ENCOUNTER — Encounter (HOSPITAL_COMMUNITY): Payer: Self-pay | Admitting: Emergency Medicine

## 2013-07-03 DIAGNOSIS — Z981 Arthrodesis status: Secondary | ICD-10-CM

## 2013-07-03 DIAGNOSIS — K8010 Calculus of gallbladder with chronic cholecystitis without obstruction: Principal | ICD-10-CM | POA: Diagnosis present

## 2013-07-03 DIAGNOSIS — K219 Gastro-esophageal reflux disease without esophagitis: Secondary | ICD-10-CM | POA: Diagnosis present

## 2013-07-03 DIAGNOSIS — G4700 Insomnia, unspecified: Secondary | ICD-10-CM | POA: Diagnosis present

## 2013-07-03 DIAGNOSIS — Z8669 Personal history of other diseases of the nervous system and sense organs: Secondary | ICD-10-CM

## 2013-07-03 DIAGNOSIS — Z6830 Body mass index (BMI) 30.0-30.9, adult: Secondary | ICD-10-CM

## 2013-07-03 DIAGNOSIS — K8020 Calculus of gallbladder without cholecystitis without obstruction: Secondary | ICD-10-CM

## 2013-07-03 DIAGNOSIS — Z7982 Long term (current) use of aspirin: Secondary | ICD-10-CM

## 2013-07-03 DIAGNOSIS — I10 Essential (primary) hypertension: Secondary | ICD-10-CM | POA: Diagnosis present

## 2013-07-03 DIAGNOSIS — F411 Generalized anxiety disorder: Secondary | ICD-10-CM | POA: Diagnosis present

## 2013-07-03 DIAGNOSIS — G4733 Obstructive sleep apnea (adult) (pediatric): Secondary | ICD-10-CM | POA: Diagnosis present

## 2013-07-03 DIAGNOSIS — F32A Depression, unspecified: Secondary | ICD-10-CM | POA: Diagnosis present

## 2013-07-03 DIAGNOSIS — Z79899 Other long term (current) drug therapy: Secondary | ICD-10-CM

## 2013-07-03 DIAGNOSIS — Z8249 Family history of ischemic heart disease and other diseases of the circulatory system: Secondary | ICD-10-CM

## 2013-07-03 DIAGNOSIS — F3289 Other specified depressive episodes: Secondary | ICD-10-CM | POA: Diagnosis present

## 2013-07-03 DIAGNOSIS — E785 Hyperlipidemia, unspecified: Secondary | ICD-10-CM | POA: Diagnosis present

## 2013-07-03 DIAGNOSIS — F419 Anxiety disorder, unspecified: Secondary | ICD-10-CM | POA: Diagnosis present

## 2013-07-03 DIAGNOSIS — F329 Major depressive disorder, single episode, unspecified: Secondary | ICD-10-CM | POA: Diagnosis present

## 2013-07-03 DIAGNOSIS — K810 Acute cholecystitis: Secondary | ICD-10-CM

## 2013-07-03 HISTORY — DX: Generalized anxiety disorder: F41.1

## 2013-07-03 LAB — COMPREHENSIVE METABOLIC PANEL
ALK PHOS: 104 U/L (ref 39–117)
ALT: 13 U/L (ref 0–35)
AST: 18 U/L (ref 0–37)
Albumin: 4.4 g/dL (ref 3.5–5.2)
BUN: 8 mg/dL (ref 6–23)
CO2: 28 meq/L (ref 19–32)
Calcium: 10.1 mg/dL (ref 8.4–10.5)
Chloride: 97 mEq/L (ref 96–112)
Creatinine, Ser: 0.76 mg/dL (ref 0.50–1.10)
GLUCOSE: 137 mg/dL — AB (ref 70–99)
POTASSIUM: 3.9 meq/L (ref 3.7–5.3)
Sodium: 138 mEq/L (ref 137–147)
TOTAL PROTEIN: 8 g/dL (ref 6.0–8.3)
Total Bilirubin: 1.1 mg/dL (ref 0.3–1.2)

## 2013-07-03 LAB — CBC
HEMATOCRIT: 44.6 % (ref 36.0–46.0)
Hemoglobin: 14.8 g/dL (ref 12.0–15.0)
MCH: 28.5 pg (ref 26.0–34.0)
MCHC: 33.2 g/dL (ref 30.0–36.0)
MCV: 85.8 fL (ref 78.0–100.0)
Platelets: 291 10*3/uL (ref 150–400)
RBC: 5.2 MIL/uL — ABNORMAL HIGH (ref 3.87–5.11)
RDW: 13.8 % (ref 11.5–15.5)
WBC: 8.3 10*3/uL (ref 4.0–10.5)

## 2013-07-03 LAB — LIPASE, BLOOD: LIPASE: 23 U/L (ref 11–59)

## 2013-07-03 MED ORDER — ONDANSETRON HCL 4 MG/2ML IJ SOLN
4.0000 mg | Freq: Once | INTRAMUSCULAR | Status: AC
  Administered 2013-07-03: 4 mg via INTRAVENOUS
  Filled 2013-07-03: qty 2

## 2013-07-03 MED ORDER — SODIUM CHLORIDE 0.9 % IV BOLUS (SEPSIS)
1000.0000 mL | Freq: Once | INTRAVENOUS | Status: AC
  Administered 2013-07-03: 1000 mL via INTRAVENOUS

## 2013-07-03 MED ORDER — MORPHINE SULFATE 4 MG/ML IJ SOLN
4.0000 mg | Freq: Once | INTRAMUSCULAR | Status: AC
  Administered 2013-07-03: 4 mg via INTRAVENOUS
  Filled 2013-07-03: qty 1

## 2013-07-03 MED ORDER — PIPERACILLIN-TAZOBACTAM 3.375 G IVPB 30 MIN
3.3750 g | Freq: Once | INTRAVENOUS | Status: AC
  Administered 2013-07-03: 3.375 g via INTRAVENOUS
  Filled 2013-07-03: qty 50

## 2013-07-03 NOTE — ED Provider Notes (Signed)
CSN: [id number]     Arrival date & time 07/03/13  1900 History   First MD Initiated Contact with Patient 07/03/13 1906     Chief Complaint  Patient presents with  . Abdominal Pain  . Nausea  . Emesis     (Consider location/radiation/quality/duration/timing/severity/associated sxs/prior Treatment) HPI Comments: 48 year old female with a past medical history of gallstones, migraine, hypertension, anxiety, depression, GERD, hyperlipidemia and anemia presents to the emergency department complaining of worsening right upper quadrant abdominal pain for over a month. She was seen in the emergency department last month and diagnosed with gallstones on an ultrasound. She is scheduled to have a cholecystectomy in 5 days. Today the pain has been constant, severe, located in right upper quadrant, nonradiating rated 10 out of 10. She tried to eat a banana earlier today but vomited it back up. States she vomited multiple times today, nonbloody emesis. Denies fever, chills, diarrhea, urinary symptoms.  Patient is a 48 y.o. female presenting with abdominal pain and vomiting. The history is provided by the patient and medical records.  Abdominal Pain Associated symptoms: nausea and vomiting   Emesis Associated symptoms: abdominal pain     Past Medical History  Diagnosis Date  . Migraine   . Hypertension   . Anxiety and depression     mainly anxiety, Dr. Westley Chandler  . Insomnia   . Arthritis   . GERD (gastroesophageal reflux disease)     chronic gastritis on EGD 12/2012  . Seasonal allergies   . History of chicken pox   . Cholelithiasis 05/2013  . Hyperlipidemia   . OSA (obstructive sleep apnea)     trouble using CPAP  . Anxiety   . Depression   . Anemia     hx   Past Surgical History  Procedure Laterality Date  . Cesarean section  2003  . Wisdom tooth extraction  1998  . Cervical spine surgery  05/03/2010    with fusion for herniated disc  . Breast reduction surgery  12/2010  .  Esophagogastroduodenoscopy  12/2012    mild chronic gastritis, rec dexilant Bing Matter)   Family History  Problem Relation Age of Onset  . Coronary artery disease Paternal Grandfather 66    MI  . Arthritis Father   . Coronary artery disease Father 45    MI  . Arthritis Mother   . Arthritis Maternal Grandfather   . Arthritis Maternal Grandmother   . Migraines Maternal Grandmother   . Migraines Sister   . Coronary artery disease Paternal Uncle   . Diabetes Neg Hx   . Stroke Neg Hx   . Cancer Neg Hx    History  Substance Use Topics  . Smoking status: Never Smoker   . Smokeless tobacco: Never Used  . Alcohol Use: Yes     Comment: social   OB History   Grav Para Term Preterm Abortions TAB SAB Ect Mult Living                 Review of Systems  Constitutional: Positive for appetite change.  Gastrointestinal: Positive for nausea, vomiting and abdominal pain.  All other systems reviewed and are negative.     Allergies  Morphine and related  Home Medications   Prior to Admission medications   Medication Sig Start Date End Date Taking? Authorizing Provider  amphetamine-dextroamphetamine (ADDERALL) 30 MG tablet Take 30 mg by mouth 2 (two) times daily.    Historical Provider, MD  aspirin EC 81 MG tablet Take 81 mg  by mouth daily.    Historical Provider, MD  Cholecalciferol (VITAMIN D-3 PO) Take 1,000 Units by mouth daily.    Historical Provider, MD  clonazePAM (KLONOPIN) 1 MG tablet Take 1-2 mg by mouth See admin instructions. For anxiety. May take 1 tablet by mouth up to 3 times daily as needed for anxiety, may take up to 2 tablets by mouth at bedtime as needed for sleep    Historical Provider, MD  dexlansoprazole (DEXILANT) 60 MG capsule Take 60 mg by mouth daily.    Historical Provider, MD  HYDROcodone-acetaminophen (NORCO/VICODIN) 5-325 MG per tablet Take 1-2 tablets by mouth every 6 (six) hours as needed for moderate pain.    Historical Provider, MD  ibuprofen  (ADVIL,MOTRIN) 200 MG tablet Take 200-400 mg by mouth every 6 (six) hours as needed for moderate pain.    Historical Provider, MD  labetalol (NORMODYNE) 100 MG tablet Take 1 tablet 2 (two) times daily. **MUST HAVE FOLLOW UP FOR FURTHER REFILLS** 12/01/12   Eustaquio Boyden, MD  ondansetron (ZOFRAN) 4 MG tablet Take 4 mg by mouth every 6 (six) hours as needed for nausea or vomiting.    Historical Provider, MD  venlafaxine XR (EFFEXOR-XR) 75 MG 24 hr capsule Take 225 mg by mouth daily with breakfast.    Historical Provider, MD   BP 165/102  Pulse 98  Temp(Src) 97.8 F (36.6 C) (Oral)  Resp 18  SpO2 98%  LMP 06/19/2013 Physical Exam  Nursing note and vitals reviewed. Constitutional: She is oriented to person, place, and time. She appears well-developed and well-nourished. No distress.  HENT:  Head: Normocephalic and atraumatic.  Mouth/Throat: Oropharynx is clear and moist.  Eyes: Conjunctivae are normal. No scleral icterus.  Neck: Normal range of motion. Neck supple.  Cardiovascular: Normal rate, regular rhythm and normal heart sounds.   Pulmonary/Chest: Effort normal and breath sounds normal.  Abdominal: Soft. Bowel sounds are normal. There is tenderness in the right upper quadrant. There is guarding and positive Murphy's sign. There is no rigidity and no rebound.  No peritoneal signs.  Musculoskeletal: Normal range of motion. She exhibits no edema.  Neurological: She is alert and oriented to person, place, and time.  Skin: Skin is warm and dry. She is not diaphoretic.  Psychiatric: She has a normal mood and affect. Her behavior is normal.    ED Course  Procedures (including critical care time) Labs Review Labs Reviewed  CBC - Abnormal; Notable for the following:    RBC 5.20 (*)    All other components within normal limits  COMPREHENSIVE METABOLIC PANEL - Abnormal; Notable for the following:    Glucose, Bld 137 (*)    All other components within normal limits  LIPASE, BLOOD     Imaging Review US Abdomen Limited Ruq  07/03/2013   CLINICAL DATA:  Right upper quadrant pain.  EXAM: US ABDOMEN LIMITED - RIGHT UPPER QUADRANT  COMPARISON:  US ABDOMEN COMPLETE dated 05/24/2013  FINDINGS: Gallbladder:  Echogenic layering sludge and tiny gallstones present. Gallbladder wall is thickened to 4.6 mm. Trace pericholecystic fluid. Sonographic Murphy's sign was elicited.  Common bile duct:  Diameter: 6 mm, prominent without sonographic findings of choledocholithiasis.  Liver:  Diffusely mildly echogenic, with trace intrahepatic biliary dilatation.  IMPRESSION: Cholelithiasis and acute cholecystitis.  Echogenic liver suggests hepatic steatosis with trace intrahepatic biliary dilatation, in addition to mildly prominent Common bile duct which could reflect recently passed stone without sonographic findings of choledocholithiasis.   Electronically Signed   By: Pernell Dupre  Bloomer   On: 07/03/2013 23:02     EKG Interpretation None      MDM   Final diagnoses:  Acute cholecystitis  Cholelithiasis   Pt with known gallstones presenting with worsening RUQ pain, scheduled for surgery in 4 days. She is nontoxic appearing and in no apparent distress but appears uncomfortable. Afebrile. Hypertensive, vitals otherwise stable. Abdomen is soft, tenderness in right upper quadrant, positive Murphy's sign. No peritoneal signs. Plan to check labs and manage pain. If unable to manage pain, will consult surgery.  Case discussed with attending Dr. Patria Maneampos who agrees with plan of care.  9:08 PM Labs without any acute finding. Patient is uncomfortable and nauseated. Will obtain repeat abdominal ultrasound. I spoke with Gen. Surgery, Dr. Carolynne Edouardoth who would prefer a call back once ultrasound is complete.  11:17 PM Ultrasound showing cholelithiasis and acute cholecystitis. Echogenic liver suggests hepatic steatosis with trace intrahepatic biliary dilation in addition to mildly prominent common bile duct. I spoke  with Dr. Carolynne Edouardoth who will admit pt for surgery. Pt comfortable at this time.  Trevor MaceRobyn M Albert, PA-C 07/04/13 [phone]

## 2013-07-03 NOTE — ED Notes (Signed)
Pt c/o abd pain, n/v since April, is suppose to have gallbladder removed on thurs but states she is in too much pain.

## 2013-07-04 ENCOUNTER — Encounter (HOSPITAL_COMMUNITY): Admission: EM | Disposition: A | Discharge status: Home / Self Care | Payer: Self-pay | Source: Home / Self Care

## 2013-07-04 ENCOUNTER — Inpatient Hospital Stay (HOSPITAL_COMMUNITY): Payer: BC Managed Care – PPO

## 2013-07-04 ENCOUNTER — Inpatient Hospital Stay (HOSPITAL_COMMUNITY): Payer: BC Managed Care – PPO | Admitting: Anesthesiology

## 2013-07-04 ENCOUNTER — Encounter (HOSPITAL_COMMUNITY): Payer: BC Managed Care – PPO | Admitting: Anesthesiology

## 2013-07-04 DIAGNOSIS — K8010 Calculus of gallbladder with chronic cholecystitis without obstruction: Secondary | ICD-10-CM | POA: Diagnosis present

## 2013-07-04 HISTORY — PX: CHOLECYSTECTOMY: SHX55

## 2013-07-04 LAB — SURGICAL PCR SCREEN
MRSA, PCR: NEGATIVE
Staphylococcus aureus: POSITIVE — AB

## 2013-07-04 SURGERY — LAPAROSCOPIC CHOLECYSTECTOMY WITH INTRAOPERATIVE CHOLANGIOGRAM
Anesthesia: General

## 2013-07-04 MED ORDER — LACTATED RINGERS IR SOLN
Status: DC | PRN
  Administered 2013-07-04: 1000 mL

## 2013-07-04 MED ORDER — HYDROCODONE-ACETAMINOPHEN 5-325 MG PO TABS
1.0000 | ORAL_TABLET | ORAL | Status: DC | PRN
  Administered 2013-07-04: 2 via ORAL
  Filled 2013-07-04: qty 2

## 2013-07-04 MED ORDER — CEFAZOLIN SODIUM-DEXTROSE 2-3 GM-% IV SOLR
INTRAVENOUS | Status: DC | PRN
  Administered 2013-07-04: 2 g via INTRAVENOUS

## 2013-07-04 MED ORDER — CIPROFLOXACIN IN D5W 400 MG/200ML IV SOLN
400.0000 mg | Freq: Two times a day (BID) | INTRAVENOUS | Status: DC
Start: 2013-07-04 — End: 2013-07-05
  Administered 2013-07-04 – 2013-07-05 (×3): 400 mg via INTRAVENOUS
  Filled 2013-07-04 (×4): qty 200

## 2013-07-04 MED ORDER — MEPERIDINE HCL 50 MG/ML IJ SOLN: 6.2500 mg | INTRAMUSCULAR | Status: DC | PRN

## 2013-07-04 MED ORDER — LIDOCAINE HCL (CARDIAC) 20 MG/ML IV SOLN
INTRAVENOUS | Status: DC | PRN
  Administered 2013-07-04: 50 mg via INTRAVENOUS

## 2013-07-04 MED ORDER — BUPIVACAINE-EPINEPHRINE (PF) 0.25% -1:200000 IJ SOLN
INTRAMUSCULAR | Status: AC
  Filled 2013-07-04: qty 30

## 2013-07-04 MED ORDER — GLYCOPYRROLATE 0.2 MG/ML IJ SOLN
INTRAMUSCULAR | Status: AC
  Filled 2013-07-04: qty 1

## 2013-07-04 MED ORDER — GLYCOPYRROLATE 0.2 MG/ML IJ SOLN
INTRAMUSCULAR | Status: DC | PRN
  Administered 2013-07-04: .4 mg via INTRAVENOUS

## 2013-07-04 MED ORDER — KCL IN DEXTROSE-NACL 20-5-0.9 MEQ/L-%-% IV SOLN
INTRAVENOUS | Status: DC
  Administered 2013-07-04 (×2): via INTRAVENOUS
  Filled 2013-07-04 (×4): qty 1000

## 2013-07-04 MED ORDER — LACTATED RINGERS IV SOLN: INTRAVENOUS | Status: DC

## 2013-07-04 MED ORDER — LABETALOL HCL 100 MG PO TABS
100.0000 mg | ORAL_TABLET | Freq: Two times a day (BID) | ORAL | Status: DC
  Administered 2013-07-04 – 2013-07-05 (×3): 100 mg via ORAL
  Filled 2013-07-04 (×5): qty 1

## 2013-07-04 MED ORDER — FENTANYL CITRATE 0.05 MG/ML IJ SOLN
INTRAMUSCULAR | Status: AC
  Filled 2013-07-04: qty 2

## 2013-07-04 MED ORDER — ONDANSETRON HCL 4 MG/2ML IJ SOLN
INTRAMUSCULAR | Status: AC
  Filled 2013-07-04: qty 2

## 2013-07-04 MED ORDER — IOHEXOL 300 MG/ML  SOLN
INTRAMUSCULAR | Status: DC | PRN
  Administered 2013-07-04: 11 mL

## 2013-07-04 MED ORDER — KCL IN DEXTROSE-NACL 20-5-0.9 MEQ/L-%-% IV SOLN
INTRAVENOUS | Status: AC
  Filled 2013-07-04: qty 1000

## 2013-07-04 MED ORDER — MIDAZOLAM HCL 2 MG/2ML IJ SOLN
INTRAMUSCULAR | Status: AC
  Filled 2013-07-04: qty 2

## 2013-07-04 MED ORDER — CEFAZOLIN SODIUM-DEXTROSE 2-3 GM-% IV SOLR
INTRAVENOUS | Status: AC
  Filled 2013-07-04: qty 50

## 2013-07-04 MED ORDER — SUCCINYLCHOLINE CHLORIDE 20 MG/ML IJ SOLN
INTRAMUSCULAR | Status: DC | PRN
  Administered 2013-07-04: 100 mg via INTRAVENOUS

## 2013-07-04 MED ORDER — ONDANSETRON HCL 4 MG PO TABS: 4.0000 mg | ORAL_TABLET | Freq: Four times a day (QID) | ORAL | Status: DC | PRN

## 2013-07-04 MED ORDER — ONDANSETRON HCL 4 MG/2ML IJ SOLN: 4.0000 mg | Freq: Four times a day (QID) | INTRAMUSCULAR | Status: DC | PRN

## 2013-07-04 MED ORDER — NEOSTIGMINE METHYLSULFATE 10 MG/10ML IV SOLN
INTRAVENOUS | Status: AC
  Filled 2013-07-04: qty 1

## 2013-07-04 MED ORDER — FENTANYL CITRATE 0.05 MG/ML IJ SOLN
INTRAMUSCULAR | Status: DC | PRN
  Administered 2013-07-04 (×2): 50 ug via INTRAVENOUS
  Administered 2013-07-04: 100 ug via INTRAVENOUS
  Administered 2013-07-04: 50 ug via INTRAVENOUS

## 2013-07-04 MED ORDER — LIDOCAINE HCL (CARDIAC) 20 MG/ML IV SOLN
INTRAVENOUS | Status: AC
  Filled 2013-07-04: qty 5

## 2013-07-04 MED ORDER — FENTANYL CITRATE 0.05 MG/ML IJ SOLN
25.0000 ug | INTRAMUSCULAR | Status: DC | PRN
  Administered 2013-07-04: 50 ug via INTRAVENOUS

## 2013-07-04 MED ORDER — PANTOPRAZOLE SODIUM 40 MG IV SOLR
40.0000 mg | Freq: Every day | INTRAVENOUS | Status: DC
  Administered 2013-07-04: 40 mg via INTRAVENOUS
  Filled 2013-07-04 (×2): qty 40

## 2013-07-04 MED ORDER — ROCURONIUM BROMIDE 100 MG/10ML IV SOLN
INTRAVENOUS | Status: DC | PRN
  Administered 2013-07-04: 25 mg via INTRAVENOUS
  Administered 2013-07-04: 5 mg via INTRAVENOUS
  Administered 2013-07-04: 10 mg via INTRAVENOUS

## 2013-07-04 MED ORDER — ONDANSETRON HCL 4 MG/2ML IJ SOLN
INTRAMUSCULAR | Status: DC | PRN
  Administered 2013-07-04: 4 mg via INTRAVENOUS

## 2013-07-04 MED ORDER — PROPOFOL 10 MG/ML IV BOLUS
INTRAVENOUS | Status: AC
  Filled 2013-07-04: qty 20

## 2013-07-04 MED ORDER — PROPOFOL 10 MG/ML IV BOLUS
INTRAVENOUS | Status: DC | PRN
  Administered 2013-07-04: 170 mg via INTRAVENOUS

## 2013-07-04 MED ORDER — PROMETHAZINE HCL 25 MG/ML IJ SOLN: 6.2500 mg | INTRAMUSCULAR | Status: DC | PRN

## 2013-07-04 MED ORDER — HYDROMORPHONE HCL PF 2 MG/ML IJ SOLN
1.5000 mg | INTRAMUSCULAR | Status: DC | PRN
  Administered 2013-07-04 – 2013-07-05 (×5): 1.5 mg via INTRAVENOUS
  Filled 2013-07-04 (×5): qty 1

## 2013-07-04 MED ORDER — CEFAZOLIN SODIUM-DEXTROSE 2-3 GM-% IV SOLR
2.0000 g | INTRAVENOUS | Status: AC
  Administered 2013-07-05: 2 g via INTRAVENOUS
  Filled 2013-07-04: qty 50

## 2013-07-04 MED ORDER — HYDROMORPHONE HCL PF 1 MG/ML IJ SOLN
1.0000 mg | INTRAMUSCULAR | Status: DC | PRN
  Administered 2013-07-04: 1 mg via INTRAVENOUS
  Filled 2013-07-04: qty 1

## 2013-07-04 MED ORDER — NEOSTIGMINE METHYLSULFATE 10 MG/10ML IV SOLN
INTRAVENOUS | Status: DC | PRN
  Administered 2013-07-04: 5 mg via INTRAVENOUS

## 2013-07-04 MED ORDER — BUPIVACAINE-EPINEPHRINE 0.25% -1:200000 IJ SOLN
INTRAMUSCULAR | Status: DC | PRN
  Administered 2013-07-04: 24 mL

## 2013-07-04 MED ORDER — LACTATED RINGERS IV SOLN
INTRAVENOUS | Status: DC | PRN
  Administered 2013-07-04: 12:00:00 via INTRAVENOUS

## 2013-07-04 MED ORDER — CHLORHEXIDINE GLUCONATE 4 % EX LIQD
4.0000 "application " | Freq: Once | CUTANEOUS | Status: DC
  Filled 2013-07-04: qty 60

## 2013-07-04 SURGICAL SUPPLY — 35 items
ADH SKN CLS APL DERMABOND .7 (GAUZE/BANDAGES/DRESSINGS) ×1
APPLIER CLIP ROT 10 11.4 M/L (STAPLE) ×2
APR CLP MED LRG 11.4X10 (STAPLE) ×1
BAG SPEC RTRVL LRG 6X4 10 (ENDOMECHANICALS) ×1
CANISTER SUCTION 2500CC (MISCELLANEOUS) ×2 IMPLANT
CATH REDDICK CHOLANGI 4FR 50CM (CATHETERS) ×2 IMPLANT
CHLORAPREP W/TINT 26ML (MISCELLANEOUS) ×2 IMPLANT
CLIP APPLIE ROT 10 11.4 M/L (STAPLE) ×1 IMPLANT
COVER MAYO STAND STRL (DRAPES) ×2 IMPLANT
DECANTER SPIKE VIAL GLASS SM (MISCELLANEOUS) ×1 IMPLANT
DERMABOND ADVANCED (GAUZE/BANDAGES/DRESSINGS) ×1
DERMABOND ADVANCED .7 DNX12 (GAUZE/BANDAGES/DRESSINGS) ×1 IMPLANT
DRAPE C-ARM 42X120 X-RAY (DRAPES) ×2 IMPLANT
DRAPE LAPAROSCOPIC ABDOMINAL (DRAPES) ×2 IMPLANT
ELECT REM PT RETURN 9FT ADLT (ELECTROSURGICAL) ×2
ELECTRODE REM PT RTRN 9FT ADLT (ELECTROSURGICAL) ×1 IMPLANT
GLOVE BIO SURGEON STRL SZ7.5 (GLOVE) ×4 IMPLANT
GOWN STRL REUS W/ TWL XL LVL3 (GOWN DISPOSABLE) IMPLANT
GOWN STRL REUS W/TWL LRG LVL3 (GOWN DISPOSABLE) ×2 IMPLANT
GOWN STRL REUS W/TWL XL LVL3 (GOWN DISPOSABLE) ×2 IMPLANT
HEMOSTAT SURGICEL 4X8 (HEMOSTASIS) ×2 IMPLANT
IV CATH 14GX2 1/4 (CATHETERS) ×2 IMPLANT
KIT BASIN OR (CUSTOM PROCEDURE TRAY) ×2 IMPLANT
POUCH SPECIMEN RETRIEVAL 10MM (ENDOMECHANICALS) ×1 IMPLANT
SET IRRIG TUBING LAPAROSCOPIC (IRRIGATION / IRRIGATOR) ×2 IMPLANT
SOLUTION ANTI FOG 6CC (MISCELLANEOUS) ×2 IMPLANT
SUT MNCRL AB 4-0 PS2 18 (SUTURE) ×2 IMPLANT
TOWEL OR 17X26 10 PK STRL BLUE (TOWEL DISPOSABLE) ×2 IMPLANT
TOWEL OR NON WOVEN STRL DISP B (DISPOSABLE) ×2 IMPLANT
TRAY LAP CHOLE (CUSTOM PROCEDURE TRAY) ×2 IMPLANT
TROCAR BLADELESS OPT 5 75 (ENDOMECHANICALS) ×2 IMPLANT
TROCAR SLEEVE XCEL 5X75 (ENDOMECHANICALS) ×2 IMPLANT
TROCAR XCEL BLUNT TIP 100MML (ENDOMECHANICALS) ×2 IMPLANT
TROCAR XCEL NON-BLD 11X100MML (ENDOMECHANICALS) ×2 IMPLANT
TUBING INSUFFLATION 10FT LAP (TUBING) ×2 IMPLANT

## 2013-07-04 NOTE — Progress Notes (Signed)
Pt tolerating her liquid diet. Pt voided x2. SCD's on.

## 2013-07-04 NOTE — Op Note (Signed)
07/03/2013 - 07/04/2013  1:36 PM  PATIENT:  Shelly Brown  48 y.o. female  PRE-OPERATIVE DIAGNOSIS:  Gallstones, cholecystitis  POST-OPERATIVE DIAGNOSIS:  Gallstones, cholecystitis  PROCEDURE:  Procedure(s): LAPAROSCOPIC CHOLECYSTECTOMY WITH INTRAOPERATIVE CHOLANGIOGRAM (N/A)  SURGEON:  Surgeon(s) and Role:    * Robyne Askew, MD - Primary    * Kandis Cocking, MD - Assisting  PHYSICIAN ASSISTANT:   ASSISTANTS: Dr. Ezzard Standing   ANESTHESIA:   general  EBL:  Total I/O In: -  Out: 400 [Urine:400]  BLOOD ADMINISTERED:none  DRAINS: none   LOCAL MEDICATIONS USED:  MARCAINE     SPECIMEN:  Source of Specimen:  gallbladder  DISPOSITION OF SPECIMEN:  PATHOLOGY  COUNTS:  YES  TOURNIQUET:  * No tourniquets in log *  DICTATION: .Dragon Dictation  Procedure: After informed consent was obtained the patient was brought to the operating room and placed in the supine position on the operating room table. After adequate induction of general anesthesia the patient's abdomen was prepped with ChloraPrep allowed to dry and draped in usual sterile manner. The area below the umbilicus was infiltrated with quarter percent  Marcaine. A small incision was made with a 15 blade knife. The incision was carried down through the subcutaneous tissue bluntly with a hemostat and [employer] retractors. The linea alba was identified. The linea alba was incised with a 15 blade knife and each side was grasped with Coker clamps. The preperitoneal space was then probed with a hemostat until the peritoneum was opened and access was gained to the abdominal cavity. A 0 Vicryl pursestring stitch was placed in the fascia surrounding the opening. A Hassan cannula was then placed through the opening and anchored in place with the previously placed Vicryl purse string stitch. The abdomen was insufflated with carbon dioxide without difficulty. A laparoscope was inserted through the [facility] cannula in the right upper quadrant was  inspected. Next the epigastric region was infiltrated with % Marcaine. A small incision was made with a 15 blade knife. A 10 mm port was placed bluntly through this incision into the abdominal cavity under direct vision. Next 2 sites were chosen laterally on the right side of the abdomen for placement of 5 mm ports. Each of these areas was infiltrated with quarter percent Marcaine. Small stab incisions were made with a 15 blade knife. 5 mm ports were then placed bluntly through these incisions into the abdominal cavity under direct vision without difficulty. A blunt grasper was placed through the lateralmost 5 mm port and used to grasp the dome of the gallbladder and elevated anteriorly and superiorly. Another blunt grasper was placed through the other 5 mm port and used to retract the body and neck of the gallbladder. A dissector was placed through the epigastric port and using the electrocautery the peritoneal reflection at the gallbladder neck was opened. Blunt dissection was then carried out in this area until the gallbladder neck-cystic duct junction was readily identified and a good window was created. A single clip was placed on the gallbladder neck. A small  ductotomy was made just below the clip with laparoscopic scissors. A 14-gauge Angiocath was then placed through the anterior abdominal wall under direct vision. A Reddick cholangiogram catheter was then placed through the Angiocath and flushed. The catheter was then placed in the cystic duct and anchored in place with a clip. A cholangiogram was obtained that showed no filling defects good emptying into the duodenum an adequate length on the cystic duct. The anchoring clip  and catheters were then removed from the patient. 3 clips were placed proximally on the cystic duct and the duct was divided between the 2 sets of clips. Posterior to this the cystic artery was identified and again dissected bluntly in a circumferential manner until a good window  was  created. 2 clips were placed proximally and one distally on the artery and the artery was divided between the 2 sets of clips. Next a laparoscopic hook cautery device was used to separate the gallbladder from the liver bed. Prior to completely detaching the gallbladder from the liver bed the liver bed was inspected and several small bleeding points were coagulated with the electrocautery until the area was completely hemostatic. The gallbladder was then detached the rest of it from the liver bed without difficulty. A laparoscopic bag was inserted through the epigastric port. The gallbladder was placed within the bag and the bag was sealed. A laparoscope was then moved to the epigastric port. The gallbladder grasper was placed through the [facility] and used to grasp the opening of the bag. The bag with the gallbladder was then removed with the [facility] cannula through the infraumbilical port without difficulty. The fascial defect was then closed with the previously placed Vicryl pursestring stitch as well as with another figure-of-eight 0 Vicryl stitch. The liver bed was inspected again and found to be hemostatic. The abdomen was irrigated with copious amounts of saline until the effluent was clear. The ports were then removed under direct vision without difficulty and were found to be hemostatic. The gas was allowed to escape. The skin incisions were all closed with interrupted 4-0 Monocryl subcuticular stitches. Dermabond dressings were applied. The patient tolerated the procedure well. At the end of the case all needle sponge and instrument counts were correct. The patient was then awakened and taken to recovery in stable condition  PLAN OF CARE: Admit to inpatient   PATIENT DISPOSITION:  PACU - hemodynamically stable.   Delay start of Pharmacological VTE agent (>24hrs) due to surgical blood loss or risk of bleeding: yes

## 2013-07-04 NOTE — H&P (Signed)
Shelly Brown is an 48 y.o. female.   Chief Complaint: abdominal pain HPI:  The pt is a 48 yo wf who recently saw Dr. Georgette Dover with symptomatic gallstones. Her surgery is scheduled for this week. She has had increasing pain with nausea and vomiting today. She came to ER. LFT's are normal. U/s shows cholecystitis with cholelithiasis  Past Medical History  Diagnosis Date  . Migraine   . Hypertension   . Anxiety and depression     mainly anxiety, Dr. Wylene Simmer  . Insomnia   . Arthritis   . GERD (gastroesophageal reflux disease)     chronic gastritis on EGD 12/2012  . Seasonal allergies   . History of chicken pox   . Cholelithiasis 05/2013  . Hyperlipidemia   . OSA (obstructive sleep apnea)     trouble using CPAP  . Anxiety   . Depression   . Anemia     hx    Past Surgical History  Procedure Laterality Date  . Cesarean section  2003  . Wisdom tooth extraction  1998  . Cervical spine surgery  05/03/2010    with fusion for herniated disc  . Breast reduction surgery  12/2010  . Esophagogastroduodenoscopy  12/2012    mild chronic gastritis, rec dexilant Jessie Foot)    Family History  Problem Relation Age of Onset  . Coronary artery disease Paternal Grandfather 41    MI  . Arthritis Father   . Coronary artery disease Father 74    MI  . Arthritis Mother   . Arthritis Maternal Grandfather   . Arthritis Maternal Grandmother   . Migraines Maternal Grandmother   . Migraines Sister   . Coronary artery disease Paternal Uncle   . Diabetes Neg Hx   . Stroke Neg Hx   . Cancer Neg Hx    Social History:  reports that she has never smoked. She has never used smokeless tobacco. She reports that she drinks alcohol. She reports that she does not use illicit drugs.  Allergies:  Allergies  Allergen Reactions  . Morphine And Related Other (See Comments)    Hallucinations-      (Not in a hospital admission)  Results for orders placed during the hospital encounter of 07/03/13 (from the  past 48 hour(s))  CBC     Status: Abnormal   Collection Time    07/03/13  7:28 PM      Result Value Ref Range   WBC 8.3  4.0 - 10.5 K/uL   RBC 5.20 (*) 3.87 - 5.11 MIL/uL   Hemoglobin 14.8  12.0 - 15.0 g/dL   HCT 44.6  36.0 - 46.0 %   MCV 85.8  78.0 - 100.0 fL   MCH 28.5  26.0 - 34.0 pg   MCHC 33.2  30.0 - 36.0 g/dL   RDW 13.8  11.5 - 15.5 %   Platelets 291  150 - 400 K/uL  COMPREHENSIVE METABOLIC PANEL     Status: Abnormal   Collection Time    07/03/13  7:28 PM      Result Value Ref Range   Sodium 138  137 - 147 mEq/L   Potassium 3.9  3.7 - 5.3 mEq/L   Chloride 97  96 - 112 mEq/L   CO2 28  19 - 32 mEq/L   Glucose, Bld 137 (*) 70 - 99 mg/dL   BUN 8  6 - 23 mg/dL   Creatinine, Ser 0.76  0.50 - 1.10 mg/dL   Calcium 10.1  8.4 -  10.5 mg/dL   Total Protein 8.0  6.0 - 8.3 g/dL   Albumin 4.4  3.5 - 5.2 g/dL   AST 18  0 - 37 U/L   ALT 13  0 - 35 U/L   Alkaline Phosphatase 104  39 - 117 U/L   Total Bilirubin 1.1  0.3 - 1.2 mg/dL   GFR calc non Af Amer >90  >90 mL/min   GFR calc Af Amer >90  >90 mL/min   Comment: (NOTE)     The eGFR has been calculated using the CKD EPI equation.     This calculation has not been validated in all clinical situations.     eGFR's persistently <90 mL/min signify possible Chronic Kidney     Disease.  LIPASE, BLOOD     Status: None   Collection Time    07/03/13  7:28 PM      Result Value Ref Range   Lipase 23  11 - 59 U/L   US Abdomen Limited Ruq  07/03/2013   CLINICAL DATA:  Right upper quadrant pain.  EXAM: US ABDOMEN LIMITED - RIGHT UPPER QUADRANT  COMPARISON:  US ABDOMEN COMPLETE dated 05/24/2013  FINDINGS: Gallbladder:  Echogenic layering sludge and tiny gallstones present. Gallbladder wall is thickened to 4.6 mm. Trace pericholecystic fluid. Sonographic Murphy's sign was elicited.  Common bile duct:  Diameter: 6 mm, prominent without sonographic findings of choledocholithiasis.  Liver:  Diffusely mildly echogenic, with trace intrahepatic biliary  dilatation.  IMPRESSION: Cholelithiasis and acute cholecystitis.  Echogenic liver suggests hepatic steatosis with trace intrahepatic biliary dilatation, in addition to mildly prominent Common bile duct which could reflect recently passed stone without sonographic findings of choledocholithiasis.   Electronically Signed   By: Elon Alas   On: 07/03/2013 23:02    Review of Systems  Constitutional: Negative.   HENT: Negative.   Eyes: Negative.   Respiratory: Negative.   Cardiovascular: Negative.   Gastrointestinal: Positive for nausea, vomiting and abdominal pain.  Genitourinary: Negative.   Musculoskeletal: Negative.   Skin: Negative.   Neurological: Negative.   Endo/Heme/Allergies: Negative.   Psychiatric/Behavioral: Negative.     Blood pressure 149/91, pulse 59, temperature 97.8 F (36.6 C), temperature source Oral, resp. rate 18, last menstrual period 06/19/2013, SpO2 98.00%. Physical Exam  Constitutional: She is oriented to person, place, and time. She appears well-developed and well-nourished.  HENT:  Head: Normocephalic and atraumatic.  Eyes: Conjunctivae and EOM are normal. Pupils are equal, round, and reactive to light.  Neck: Normal range of motion. Neck supple.  Cardiovascular: Normal rate, regular rhythm and normal heart sounds.   Respiratory: Effort normal and breath sounds normal.  GI: Soft. Bowel sounds are normal. She exhibits no mass.  Mild RUQ tenderness  Musculoskeletal: Normal range of motion.  Neurological: She is alert and oriented to person, place, and time.  Skin: Skin is warm and dry.  Psychiatric: She has a normal mood and affect. Her behavior is normal.     Assessment/Plan The pt has cholecystitis with cholelithiasis. She is having worsening pain. I will plan to admit her for abx. She will probably need her gallbladder removed during this admission  Shelly Brown 07/04/2013, 12:42 AM

## 2013-07-04 NOTE — Anesthesia Preprocedure Evaluation (Addendum)
Anesthesia Evaluation  Patient identified by MRN, date of birth, ID band Patient awake    Reviewed: Allergy & Precautions, H&P , NPO status , Patient's Chart, lab work & pertinent test results  Airway Mallampati: II TM Distance: >3 FB Neck ROM: Full    Dental no notable dental hx.    Pulmonary neg pulmonary ROS, sleep apnea ,  breath sounds clear to auscultation  Pulmonary exam normal       Cardiovascular hypertension, Pt. on medications Rhythm:Regular Rate:Normal     Neuro/Psych negative neurological ROS  negative psych ROS   GI/Hepatic Neg liver ROS, GERD-  Medicated and Poorly Controlled,  Endo/Other  negative endocrine ROS  Renal/GU negative Renal ROS  negative genitourinary   Musculoskeletal negative musculoskeletal ROS (+)   Abdominal   Peds negative pediatric ROS (+)  Hematology negative hematology ROS (+)   Anesthesia Other Findings   Reproductive/Obstetrics negative OB ROS                        Anesthesia Physical Anesthesia Plan  ASA: II  Anesthesia Plan: General   Post-op Pain Management:    Induction: Intravenous, Rapid sequence and Cricoid pressure planned  Airway Management Planned: Oral ETT  Additional Equipment:   Intra-op Plan:   Post-operative Plan: Extubation in OR  Informed Consent: I have reviewed the patients History and Physical, chart, labs and discussed the procedure including the risks, benefits and alternatives for the proposed anesthesia with the patient or authorized representative who has indicated his/her understanding and acceptance.   Dental advisory given  Plan Discussed with: CRNA  Anesthesia Plan Comments:         Anesthesia Quick Evaluation

## 2013-07-04 NOTE — Transfer of Care (Signed)
Immediate Anesthesia Transfer of Care Note  Patient: Shelly Brown  Procedure(s) Performed: Procedure(s): LAPAROSCOPIC CHOLECYSTECTOMY WITH INTRAOPERATIVE CHOLANGIOGRAM (N/A)  Patient Location: PACU  Anesthesia Type:General  Level of Consciousness: awake, sedated and patient cooperative  Airway & Oxygen Therapy: Patient Spontanous Breathing and Patient connected to face mask oxygen  Post-op Assessment: Report given to PACU RN, Post -op Vital signs reviewed and stable and Patient moving all extremities X 4  Post vital signs: stable  Complications: No apparent anesthesia complications

## 2013-07-04 NOTE — ED Provider Notes (Signed)
Medical screening examination/treatment/procedure(s) were conducted as a shared visit with non-physician practitioner(s) and myself.  I personally evaluated the patient during the encounter.   EKG Interpretation None      Admit for cholecystitis.  Zosyn now.  Admit to general surgery  Lyanne Co, MD 07/04/13 [phone]

## 2013-07-04 NOTE — Anesthesia Postprocedure Evaluation (Signed)
  Anesthesia Post-op Note  Patient: Shelly Brown  Procedure(s) Performed: Procedure(s) (LRB): LAPAROSCOPIC CHOLECYSTECTOMY WITH INTRAOPERATIVE CHOLANGIOGRAM (N/A)  Patient Location: PACU  Anesthesia Type: General  Level of Consciousness: awake and alert   Airway and Oxygen Therapy: Patient Spontanous Breathing  Post-op Pain: mild  Post-op Assessment: Post-op Vital signs reviewed, Patient's Cardiovascular Status Stable, Respiratory Function Stable, Patent Airway and No signs of Nausea or vomiting  Last Vitals:  Filed Vitals:   07/04/13 1559  BP: 124/69  Pulse: 68  Temp: 36.6 C  Resp: 16    Post-op Vital Signs: stable   Complications: No apparent anesthesia complications

## 2013-07-04 NOTE — Progress Notes (Signed)
[id number]/chart review performed/

## 2013-07-05 ENCOUNTER — Telehealth (INDEPENDENT_AMBULATORY_CARE_PROVIDER_SITE_OTHER): Payer: Self-pay

## 2013-07-05 ENCOUNTER — Encounter (HOSPITAL_COMMUNITY): Payer: Self-pay | Admitting: General Surgery

## 2013-07-05 MED ORDER — HYDROCODONE-ACETAMINOPHEN 5-325 MG PO TABS: 1.0000 | ORAL_TABLET | ORAL | Status: DC | PRN

## 2013-07-05 MED ORDER — IBUPROFEN 200 MG PO TABS: ORAL_TABLET | ORAL | Status: DC

## 2013-07-05 MED ORDER — OXYCODONE-ACETAMINOPHEN 5-325 MG PO TABS: 1.0000 | ORAL_TABLET | ORAL | Status: DC | PRN

## 2013-07-05 MED ORDER — IBUPROFEN 600 MG PO TABS
600.0000 mg | ORAL_TABLET | Freq: Four times a day (QID) | ORAL | Status: DC | PRN
  Filled 2013-07-05: qty 1

## 2013-07-05 MED ORDER — OXYCODONE-ACETAMINOPHEN 5-325 MG PO TABS
1.0000 | ORAL_TABLET | ORAL | Status: DC | PRN
  Administered 2013-07-05: 2 via ORAL
  Filled 2013-07-05: qty 2

## 2013-07-05 NOTE — Progress Notes (Signed)
Nutrition Brief Note   Patient identified on the Malnutrition Screening Tool (MST) Report   Wt Readings from Last 15 Encounters:  07/04/13 176 lb 12.9 oz (80.2 kg)  07/04/13 176 lb 12.9 oz (80.2 kg)  06/29/13 178 lb 14.4 oz (81.149 kg)  06/10/13 178 lb 9.6 oz (81.012 kg)  05/28/11 166 lb 4 oz (75.411 kg)  11/27/10 175 lb (79.379 kg)  11/12/10 184 lb (83.462 kg)  11/05/10 185 lb (83.915 kg)  09/24/10 188 lb (85.276 kg)  11/22/09 167 lb 12.8 oz (76.114 kg)  10/04/08 173 lb (78.472 kg)    Body mass index is 30.33 kg/(m^2). Patient meets criteria for Obesity based on current BMI.   Current diet order is Heart, patient is consuming approximately 100% of meals at this time. Labs and medications reviewed.   Pt stated that she has a great appetite and has not lost any weight recently. No muscle or subcutaneous fat depletion noticed.   No nutrition interventions warranted at this time. If nutrition issues arise, please consult RD.   Eppie Gibson, BS Dietetic Intern Pager: [phone]

## 2013-07-05 NOTE — Progress Notes (Signed)
Seen and agree  

## 2013-07-05 NOTE — Discharge Instructions (Signed)
Laparoscopic Cholecystectomy, Care After °Refer to this sheet in the next few weeks. These instructions provide you with information on caring for yourself after your procedure. Your health care provider may also give you more specific instructions. Your treatment has been planned according to current medical practices, but problems sometimes occur. Call your health care provider if you have any problems or questions after your procedure. °WHAT TO EXPECT AFTER THE PROCEDURE °After your procedure, it is typical to have the following: °· Pain at your incision sites. You will be given pain medicines to control the pain. °· Mild nausea or vomiting. This should improve after the first 24 hours. °· Bloating and possibly shoulder pain from the gas used during the procedure. This will improve after the first 24 hours. °HOME CARE INSTRUCTIONS  °· Change bandages (dressings) as directed by your health care provider. °· Keep the wound dry and clean. You may wash the wound gently with soap and water. Gently blot or dab the area dry. °· Do not take baths or use swimming pools or hot tubs for 2 weeks or until your health care provider approves. °· Only take over-the-counter or prescription medicines as directed by your health care provider. °· Continue your normal diet as directed by your health care provider. °· Do not lift anything heavier than 10 pounds (4.5 kg) until your health care provider approves. °· Do not play contact sports for 1 week or until your health care provider approves. °SEEK MEDICAL CARE IF:  °· You have redness, swelling, or increasing pain in the wound. °· You notice yellowish-white fluid (pus) coming from the wound. °· You have drainage from the wound that lasts longer than 1 day. °· You notice a bad smell coming from the wound or dressing. °· Your surgical cuts (incisions) break open. °SEEK IMMEDIATE MEDICAL CARE IF:  °· You develop a rash. °· You have difficulty breathing. °· You have chest pain. °· You  have a fever. °· You have increasing pain in the shoulders (shoulder strap areas). °· You have dizzy episodes or faint while standing. °· You have severe abdominal pain. °· You feel sick to your stomach (nauseous) or throw up (vomit) and this lasts for more than 1 day. °Document Released: 01/27/2005 Document Revised: 11/17/2012 Document Reviewed: 09/08/2012 °ExitCare® Patient Information ©2014 [employer]. ° °CCS ______[facility] °LAPAROSCOPIC SURGERY: POST OP INSTRUCTIONS °Always review your discharge instruction sheet given to you by the facility where your surgery was performed. °IF YOU HAVE DISABILITY OR FAMILY LEAVE FORMS, YOU MUST BRING THEM TO THE OFFICE FOR PROCESSING.   °DO NOT GIVE THEM TO YOUR DOCTOR. ° °1. A prescription for pain medication may be given to you upon discharge.  Take your pain medication as prescribed, if needed.  If narcotic pain medicine is not needed, then you may take acetaminophen (Tylenol) or ibuprofen (Advil) as needed. °2. Take your usually prescribed medications unless otherwise directed. °3. If you need a refill on your pain medication, please contact your pharmacy.  They will contact our office to request authorization. Prescriptions will not be filled after 5pm or on week-ends. °4. You should follow a light diet the first few days after arrival home, such as soup and crackers, etc.  Be sure to include lots of fluids daily. °5. Most patients will experience some swelling and bruising in the area of the incisions.  Ice packs will help.  Swelling and bruising can take several days to resolve.  °6. It is common   to experience some constipation if taking pain medication after surgery.  Increasing fluid intake and taking a stool softener (such as Colace) will usually help or prevent this problem from occurring.  A mild laxative (Milk of Magnesia or Miralax) should be taken according to package instructions if there are no bowel movements after 48  hours. °7. Unless discharge instructions indicate otherwise, you may remove your bandages 24-48 hours after surgery, and you may shower at that time.  You may have steri-strips (small skin tapes) in place directly over the incision.  These strips should be left on the skin for 7-10 days.  If your surgeon used skin glue on the incision, you may shower in 24 hours.  The glue will flake off over the next 2-3 weeks.  Any sutures or staples will be removed at the office during your follow-up visit. °8. ACTIVITIES:  You may resume regular (light) daily activities beginning the next day--such as daily self-care, walking, climbing stairs--gradually increasing activities as tolerated.  You may have sexual intercourse when it is comfortable.  Refrain from any heavy lifting or straining until approved by your doctor. °a. You may drive when you are no longer taking prescription pain medication, you can comfortably wear a seatbelt, and you can safely maneuver your car and apply brakes. °b. RETURN TO WORK:  __________________________________________________________ °9. You should see your doctor in the office for a follow-up appointment approximately 2-3 weeks after your surgery.  Make sure that you call for this appointment within a day or two after you arrive home to insure a convenient appointment time. °10. OTHER INSTRUCTIONS: __________________________________________________________________________________________________________________________ __________________________________________________________________________________________________________________________ °WHEN TO CALL YOUR DOCTOR: °1. Fever over 101.0 °2. Inability to urinate °3. Continued bleeding from incision. °4. Increased pain, redness, or drainage from the incision. °5. Increasing abdominal pain ° °The clinic staff is available to answer your questions during regular business hours.  Please don’t hesitate to call and ask to speak to one of the nurses for  clinical concerns.  If you have a medical emergency, go to the nearest emergency room or call 911.  A surgeon from [facility] is always on call at the hospital. °[address], [address], [place], [place]  [address] ? [address] [address], [place], [place]   [address] °[phone] ? [phone] ? FAX [phone] °Web site: [email or web address] ° °

## 2013-07-05 NOTE — Progress Notes (Signed)
Pt was lying in bed during our visit. She described coming to the hospital w/pain and having gall bladder surgery ystrdy. Pt said she recognized me from assisting family when her mother passed last year. We talked about how it has been this first year for her. She said it has been difficult, especially since she has moved into her mother's house and she has gone through a divorce. Pt said she was still thankful. Pt was thankful for visit and wanted prayer before we concluded. Marjory Lies Chaplain  07/05/13 1100  Clinical Encounter Type  Visited With Patient

## 2013-07-05 NOTE — Progress Notes (Signed)
I agree with dietetic intern's note.   Annalissa Murphey Baron MS, RD, LDN [phone] Pager [phone] After Hours Pager  

## 2013-07-05 NOTE — Progress Notes (Signed)
Discharge instructions given and explained.  Prescriptions given. Left via walking with family member.  Donn Pierini

## 2013-07-05 NOTE — Telephone Encounter (Signed)
Patient had Laparoscopic Cholecystectomy w/IOC surgery on 07/04/13.  Patient was originally scheduled for Laparoscopic Cholecystectomy w/IOC on 07/07/13 with Dr. Corliss Skains.  Surgery cancellation slip has been completed and given to [facility] to cancel surgery.  Message to be routed to Dr. Corliss Skains to make aware of cancellation.

## 2013-07-05 NOTE — Progress Notes (Signed)
1 Day Post-Op  Subjective: She is fairly sore, complained of pain issues last pM.  I told her to expect to be sore.  She looks fine and sites look fine.  Objective: Vital signs in last 24 hours: Temp:  [97.3 F (36.3 C)-98.4 F (36.9 C)] 97.5 F (36.4 C) (05/26 0503) Pulse Rate:  [59-80] 59 (05/26 0503) Resp:  [14-22] 14 (05/26 0503) BP: (112-156)/(69-94) 112/75 mmHg (05/26 0503) SpO2:  [94 %-100 %] 94 % (05/26 0503) Last BM Date: 07/03/13  1120 PO recorded Afebrile VSS No labs Diet: clear  Intake/Output from previous day: 05/25 0701 - 05/26 0700 In: 3810 [P.O.:1120; I.V.:2290; IV Piggyback:400] Out: 2100 [Urine:2100] Intake/Output this shift:    General appearance: alert, cooperative and no distress Resp: clear to auscultation bilaterally GI: soft sore, sites look fine, doing well with clears.  Lab Results:   Recent Labs  07/03/13 1928  WBC 8.3  HGB 14.8  HCT 44.6  PLT 291    BMET  Recent Labs  07/03/13 1928  NA 138  K 3.9  CL 97  CO2 28  GLUCOSE 137*  BUN 8  CREATININE 0.76  CALCIUM 10.1   PT/INR No results found for this basename: LABPROT, INR,  in the last 72 hours   Recent Labs Lab 07/03/13 1928  AST 18  ALT 13  ALKPHOS 104  BILITOT 1.1  PROT 8.0  ALBUMIN 4.4     Lipase     Component Value Date/Time   LIPASE 23 07/03/2013 1928     Studies/Results: Dg Cholangiogram Operative  07/04/2013   CLINICAL DATA:  ruq pain, cholelithiasis  EXAM: INTRAOPERATIVE CHOLANGIOGRAM  TECHNIQUE: Cholangiographic images from the C-arm fluoroscopic device were submitted for interpretation post-operatively. Please see the procedural report for the amount of contrast and the fluoroscopy time utilized.  COMPARISON:  None.  FINDINGS: No persistent filling defects in the common duct. Intrahepatic ducts are incompletely visualized, appearing decompressed centrally. Contrast passes into the duodenum.  : Negative for retained common duct stone.   Electronically  Signed   By: Oley Balm M.D.   On: 07/04/2013 13:32   US Abdomen Limited Ruq  07/03/2013   CLINICAL DATA:  Right upper quadrant pain.  EXAM: US ABDOMEN LIMITED - RIGHT UPPER QUADRANT  COMPARISON:  US ABDOMEN COMPLETE dated 05/24/2013  FINDINGS: Gallbladder:  Echogenic layering sludge and tiny gallstones present. Gallbladder wall is thickened to 4.6 mm. Trace pericholecystic fluid. Sonographic Murphy's sign was elicited.  Common bile duct:  Diameter: 6 mm, prominent without sonographic findings of choledocholithiasis.  Liver:  Diffusely mildly echogenic, with trace intrahepatic biliary dilatation.  IMPRESSION: Cholelithiasis and acute cholecystitis.  Echogenic liver suggests hepatic steatosis with trace intrahepatic biliary dilatation, in addition to mildly prominent Common bile duct which could reflect recently passed stone without sonographic findings of choledocholithiasis.   Electronically Signed   By: Awilda Metro   On: 07/03/2013 23:02    Medications: . chlorhexidine  4 application Topical Once  . ciprofloxacin  400 mg Intravenous Q12H  . labetalol  100 mg Oral BID  . pantoprazole (PROTONIX) IV  40 mg Intravenous QHS    Assessment/Plan Gallstones, cholecystitis LAPAROSCOPIC CHOLECYSTECTOMY WITH INTRAOPERATIVE CHOLANGIOGRAM SURGEON: Surgeon(s) and Role:  Robyne Askew, MD - Primary, 07/04/2013  Hx of migraine, Anxiety/depression/insomnia Hypertension OSA    Plan:  Low fat diet, mobilize and if OK home after lunch today.   LOS: 2 days    Sherrie George 07/05/2013  '

## 2013-07-07 ENCOUNTER — Encounter (HOSPITAL_COMMUNITY): Admission: RE | Payer: Self-pay | Source: Ambulatory Visit

## 2013-07-07 ENCOUNTER — Ambulatory Visit (HOSPITAL_COMMUNITY): Admission: RE | Admit: 2013-07-07 | Payer: BC Managed Care – PPO | Source: Ambulatory Visit | Admitting: Surgery

## 2013-07-07 SURGERY — LAPAROSCOPIC CHOLECYSTECTOMY WITH INTRAOPERATIVE CHOLANGIOGRAM
Anesthesia: General

## 2013-07-22 ENCOUNTER — Encounter (INDEPENDENT_AMBULATORY_CARE_PROVIDER_SITE_OTHER): Payer: Self-pay

## 2013-07-22 ENCOUNTER — Encounter (INDEPENDENT_AMBULATORY_CARE_PROVIDER_SITE_OTHER): Payer: Self-pay | Admitting: Surgery

## 2013-07-22 ENCOUNTER — Ambulatory Visit (INDEPENDENT_AMBULATORY_CARE_PROVIDER_SITE_OTHER): Payer: BC Managed Care – PPO | Admitting: General Surgery

## 2013-07-22 VITALS — BP 122/70 | HR 81 | Temp 97.9°F | Ht 64.0 in | Wt 178.0 lb

## 2013-07-22 DIAGNOSIS — K8010 Calculus of gallbladder with chronic cholecystitis without obstruction: Secondary | ICD-10-CM

## 2013-07-22 NOTE — Patient Instructions (Signed)
May return to normal activities 

## 2013-07-22 NOTE — Progress Notes (Signed)
Subjective:     Patient ID: Shelly Brown, female   DOB: [birth year], 48 y.o.   MRN: [id number]  HPI The patient is a 48 year old white female who is about 2 weeks status post Laparoscopic cholecystectomy for cholecystitis with cholelithiasis. She tolerated the surgery well. She feels much better than she did before surgery. Her appetite is good and her bowels are looser than before surgery but well controlled.  Review of Systems     Objective:   Physical Exam On exam her abdomen is soft with minimal tenderness. Her incisions are all healing nicely with no sign of infection    Assessment:     The patient is 2 weeks status post laparoscopic cholecystectomy     Plan:     At this point she may return to her normal activities. I will plan to see her back on a when necessary basis.

## 2013-07-26 ENCOUNTER — Encounter (INDEPENDENT_AMBULATORY_CARE_PROVIDER_SITE_OTHER): Payer: BC Managed Care – PPO | Admitting: General Surgery

## 2013-08-15 ENCOUNTER — Ambulatory Visit: Payer: BC Managed Care – PPO | Admitting: Psychology

## 2013-08-16 ENCOUNTER — Encounter (HOSPITAL_COMMUNITY): Payer: Self-pay | Admitting: General Surgery

## 2013-08-16 DIAGNOSIS — F411 Generalized anxiety disorder: Secondary | ICD-10-CM

## 2013-08-16 HISTORY — DX: Generalized anxiety disorder: F41.1

## 2013-08-16 NOTE — Discharge Summary (Signed)
Physician Discharge Summary  Patient ID: Shelly Brown MRN: [id number] DOB/AGE: Nov 10, 1965 48 y.o.  Admit date: 07/03/2013 Discharge date: 07/05/2013  Admission Diagnoses:   Gallstones, cholecystitis  Hx of migraine,  Anxiety/depression/insomnia  Hypertension  OSA   Discharge Diagnoses:  Same   Principal Problem:   Cholecystitis with cholelithiasis Active Problems:   HYPERTENSION, BENIGN   Hx of migraine headaches   OSA (obstructive sleep apnea)   Anxiety and depression   PROCEDURES: LAPAROSCOPIC CHOLECYSTECTOMY WITH INTRAOPERATIVE CHOLANGIOGRAM SURGEON: Surgeon(s) and Role: Robyne AskewPaul S Toth III, MD - Primary, 07/04/2013     Hospital Course: The pt is a 48 yo wf who recently saw Dr. Corliss Skainssuei with symptomatic gallstones. Her surgery is scheduled for this week. She has had increasing pain with nausea and vomiting today. She came to ER. LFT's are normal. U/s shows cholecystitis with cholelithiasis She was seen in the ED and admitted.  She was started on IV antibiotics, and taken to the OR the following day.  She had her diet advanced, she was mobilized and was ready for discharge the following day.  Condition on discharge:  Improved     Disposition: 01-Home or Self Care     Medication List    STOP taking these medications       HYDROcodone-acetaminophen 5-325 MG per tablet  Commonly known as:  NORCO/VICODIN      TAKE these medications       amphetamine-dextroamphetamine 30 MG tablet  Commonly known as:  ADDERALL  Take 30 mg by mouth 2 (two) times daily.     aspirin EC 81 MG tablet  Take 81 mg by mouth daily.     clonazePAM 1 MG tablet  Commonly known as:  KLONOPIN  Take 1-2 mg by mouth See admin instructions. For anxiety. May take 1 mg by mouth up to 3 times daily as needed for anxiety, may take up to 2 mg by mouth at bedtime as needed for sleep     DEXILANT 60 MG capsule  Generic drug:  dexlansoprazole  Take 60 mg by mouth daily.     ibuprofen 200 MG tablet   Commonly known as:  ADVIL,MOTRIN  You can take 2-3 of these every 6 hours as needed.     labetalol 100 MG tablet  Commonly known as:  NORMODYNE  Take 100 mg by mouth 2 (two) times daily.     oxyCODONE-acetaminophen 5-325 MG per tablet  Commonly known as:  PERCOCET/ROXICET  Take 1-2 tablets by mouth every 4 (four) hours as needed for moderate pain.     venlafaxine XR 75 MG 24 hr capsule  Commonly known as:  EFFEXOR-XR  Take 225 mg by mouth daily with breakfast.     VITAMIN D-3 PO  Take 1,000 Units by mouth daily.       Follow-up Information   Follow up with [facility] On 07/26/2013. (Your appointment is at 2:15 PM be there 30 minutes early for check in.)    Contact information:   [address]   [place] [id number] [phone]       Signed: Sherrie GeorgeJENNINGS,Dezarae Mcclaran 08/16/2013, 12:02 PM

## 2013-08-22 ENCOUNTER — Encounter: Payer: Self-pay | Admitting: Family Medicine

## 2013-08-26 ENCOUNTER — Ambulatory Visit (INDEPENDENT_AMBULATORY_CARE_PROVIDER_SITE_OTHER): Payer: BC Managed Care – PPO | Admitting: Psychology

## 2013-08-26 DIAGNOSIS — F4323 Adjustment disorder with mixed anxiety and depressed mood: Secondary | ICD-10-CM

## 2013-09-07 ENCOUNTER — Ambulatory Visit (INDEPENDENT_AMBULATORY_CARE_PROVIDER_SITE_OTHER): Payer: BC Managed Care – PPO | Admitting: Psychology

## 2013-09-07 DIAGNOSIS — F4323 Adjustment disorder with mixed anxiety and depressed mood: Secondary | ICD-10-CM

## 2013-09-28 ENCOUNTER — Ambulatory Visit (INDEPENDENT_AMBULATORY_CARE_PROVIDER_SITE_OTHER): Payer: BC Managed Care – PPO | Admitting: Psychology

## 2013-09-28 DIAGNOSIS — F4323 Adjustment disorder with mixed anxiety and depressed mood: Secondary | ICD-10-CM

## 2013-10-12 ENCOUNTER — Ambulatory Visit (INDEPENDENT_AMBULATORY_CARE_PROVIDER_SITE_OTHER): Payer: BC Managed Care – PPO | Admitting: Psychology

## 2013-10-12 DIAGNOSIS — F4323 Adjustment disorder with mixed anxiety and depressed mood: Secondary | ICD-10-CM

## 2013-10-31 ENCOUNTER — Emergency Department (HOSPITAL_BASED_OUTPATIENT_CLINIC_OR_DEPARTMENT_OTHER)
Admission: EM | Admit: 2013-10-31 | Discharge: 2013-10-31 | Disposition: A | Discharge status: Home / Self Care | Payer: BC Managed Care – PPO | Attending: Emergency Medicine | Admitting: Emergency Medicine

## 2013-10-31 ENCOUNTER — Encounter (HOSPITAL_BASED_OUTPATIENT_CLINIC_OR_DEPARTMENT_OTHER): Payer: Self-pay | Admitting: Emergency Medicine

## 2013-10-31 DIAGNOSIS — M129 Arthropathy, unspecified: Secondary | ICD-10-CM | POA: Insufficient documentation

## 2013-10-31 DIAGNOSIS — Z8709 Personal history of other diseases of the respiratory system: Secondary | ICD-10-CM | POA: Diagnosis not present

## 2013-10-31 DIAGNOSIS — F3289 Other specified depressive episodes: Secondary | ICD-10-CM | POA: Diagnosis not present

## 2013-10-31 DIAGNOSIS — F329 Major depressive disorder, single episode, unspecified: Secondary | ICD-10-CM | POA: Insufficient documentation

## 2013-10-31 DIAGNOSIS — M5430 Sciatica, unspecified side: Secondary | ICD-10-CM

## 2013-10-31 DIAGNOSIS — Z7982 Long term (current) use of aspirin: Secondary | ICD-10-CM | POA: Diagnosis not present

## 2013-10-31 DIAGNOSIS — Z8639 Personal history of other endocrine, nutritional and metabolic disease: Secondary | ICD-10-CM | POA: Insufficient documentation

## 2013-10-31 DIAGNOSIS — G43909 Migraine, unspecified, not intractable, without status migrainosus: Secondary | ICD-10-CM | POA: Insufficient documentation

## 2013-10-31 DIAGNOSIS — Z79899 Other long term (current) drug therapy: Secondary | ICD-10-CM | POA: Diagnosis not present

## 2013-10-31 DIAGNOSIS — F411 Generalized anxiety disorder: Secondary | ICD-10-CM | POA: Insufficient documentation

## 2013-10-31 DIAGNOSIS — Z862 Personal history of diseases of the blood and blood-forming organs and certain disorders involving the immune mechanism: Secondary | ICD-10-CM | POA: Diagnosis not present

## 2013-10-31 DIAGNOSIS — Z8619 Personal history of other infectious and parasitic diseases: Secondary | ICD-10-CM | POA: Insufficient documentation

## 2013-10-31 DIAGNOSIS — M25569 Pain in unspecified knee: Secondary | ICD-10-CM | POA: Diagnosis present

## 2013-10-31 DIAGNOSIS — I10 Essential (primary) hypertension: Secondary | ICD-10-CM | POA: Diagnosis not present

## 2013-10-31 DIAGNOSIS — G4733 Obstructive sleep apnea (adult) (pediatric): Secondary | ICD-10-CM | POA: Insufficient documentation

## 2013-10-31 DIAGNOSIS — M25561 Pain in right knee: Secondary | ICD-10-CM

## 2013-10-31 DIAGNOSIS — Z8719 Personal history of other diseases of the digestive system: Secondary | ICD-10-CM | POA: Diagnosis not present

## 2013-10-31 MED ORDER — CYCLOBENZAPRINE HCL 10 MG PO TABS: 10.0000 mg | ORAL_TABLET | Freq: Two times a day (BID) | ORAL | Status: DC | PRN

## 2013-10-31 NOTE — ED Notes (Signed)
C/o right knee and lower back pain-hx of same without injury

## 2013-10-31 NOTE — Discharge Instructions (Signed)
Arthritis, Nonspecific °Arthritis is pain, redness, warmth, or puffiness (inflammation) of a joint. The joint may be stiff or hurt when you move it. One or more joints may be affected. There are many types of arthritis. Your doctor may not know what type you have right away. The most common cause of arthritis is wear and tear on the joint (osteoarthritis). °HOME CARE  °· Only take medicine as told by your doctor. °· Rest the joint as much as possible. °· Raise (elevate) your joint if it is puffy. °· Use crutches if the painful joint is in your leg. °· Drink enough fluids to keep your pee (urine) clear or pale yellow. °· Follow your doctor's diet instructions. °· Use cold packs for very bad joint pain for 10 to 15 minutes every hour. Ask your doctor if it is okay for you to use hot packs. °· Exercise as told by your doctor. °· Take a warm shower if you have stiffness in the morning. °· Move your sore joints throughout the day. °GET HELP RIGHT AWAY IF:  °· You have a fever. °· You have very bad joint pain, puffiness, or redness. °· You have many joints that are painful and puffy. °· You are not getting better with treatment. °· You have very bad back pain or leg weakness. °· You cannot control when you poop (bowel movement) or pee (urinate). °· You do not feel better in 24 hours or are getting worse. °· You are having side effects from your medicine. °MAKE SURE YOU:  °· Understand these instructions. °· Will watch your condition. °· Will get help right away if you are not doing well or get worse. °Document Released: 04/23/2009 Document Revised: 07/29/2011 Document Reviewed: 04/23/2009 °[employer]® Patient Information ©2015 [employer]. This information is not intended to replace advice given to you by your health care provider. Make sure you discuss any questions you have with your health care provider. ° °

## 2013-10-31 NOTE — ED Notes (Signed)
Pt initially declined crutches, stating that she has crutches at home. Pt now states that she forgot that she gave those crutches away and requests a new pair. Order reentered.

## 2013-11-02 ENCOUNTER — Encounter: Payer: Self-pay | Admitting: Family Medicine

## 2013-11-02 ENCOUNTER — Ambulatory Visit (INDEPENDENT_AMBULATORY_CARE_PROVIDER_SITE_OTHER): Payer: BC Managed Care – PPO | Admitting: Psychology

## 2013-11-02 ENCOUNTER — Ambulatory Visit (INDEPENDENT_AMBULATORY_CARE_PROVIDER_SITE_OTHER): Payer: BC Managed Care – PPO | Admitting: Family Medicine

## 2013-11-02 VITALS — BP 128/77 | HR 76 | Ht 64.0 in | Wt 175.0 lb

## 2013-11-02 DIAGNOSIS — F4323 Adjustment disorder with mixed anxiety and depressed mood: Secondary | ICD-10-CM

## 2013-11-02 DIAGNOSIS — M25561 Pain in right knee: Secondary | ICD-10-CM

## 2013-11-02 DIAGNOSIS — M25569 Pain in unspecified knee: Secondary | ICD-10-CM

## 2013-11-02 NOTE — Patient Instructions (Signed)
Your pain is due to arthritis of your knee.  Below are the different medicines you can use for this: Take tylenol  1-2 tabs three times a day for pain. Aleve 1-2 tabs twice a day with food Glucosamine sulfate  twice a day is a supplement that may help. Capsaicin topically up to four times a day may also help with pain. Cortisone injections are an option - call me if you want to try one of these. If cortisone injections do not help, there are different types of shots that may help but they take longer to take effect. It's important that you continue to stay active. If you are overweight, try to lose weight through diet and exercise. Straight leg raises, leg raises with foot turned outward, side leg raises 3 sets of 10 once a day (add ankle weight if these become too easy). Consider physical therapy to strengthen muscles around the joint that hurts to take pressure off of the joint itself. Shoe inserts with good arch support may be helpful. Heat or ice 15 minutes at a time 3-4 times a day as needed to help with pain. Water aerobics and cycling with low resistance are the best two types of exercise for arthritis. Follow up with me in 5-6 weeks for reevaluation.

## 2013-11-02 NOTE — ED Provider Notes (Signed)
CSN: [id number]     Arrival date & time 10/31/13  1247 History   First MD Initiated Contact with Patient 10/31/13 1335     Chief Complaint  Patient presents with  . Knee Pain     (Consider location/radiation/quality/duration/timing/severity/associated sxs/prior Treatment) HPI 48 y.o. Female with right knee pain increasing over several months.  No known injury, redness, or warmth.  Patient thinks there is some swelling.  Pain increases with movement and some with palpation.  Past Medical History  Diagnosis Date  . Migraine   . Hypertension   . Anxiety and depression     mainly anxiety, Dr. Westley Chandler  . Insomnia   . Arthritis   . GERD (gastroesophageal reflux disease)     chronic gastritis on EGD 12/2012  . Seasonal allergies   . History of chicken pox   . Cholelithiasis 05/2013  . Hyperlipidemia   . OSA (obstructive sleep apnea)     trouble using CPAP  . Anxiety   . Depression   . Anemia     hx  . Anxiety state, unspecified 08/16/2013   Past Surgical History  Procedure Laterality Date  . Cesarean section  2003  . Wisdom tooth extraction  1998  . Cervical spine surgery  05/03/2010    with fusion for herniated disc  . Breast reduction surgery  12/2010  . Esophagogastroduodenoscopy  12/2012    mild chronic gastritis, rec dexilant [facility])  . Cholecystectomy N/A 07/04/2013    LAPAROSCOPIC CHOLECYSTECTOMY WITH INTRAOPERATIVE CHOLANGIOGRAM;  Surgeon: Robyne Askew, MD   Family History  Problem Relation Age of Onset  . Coronary artery disease Paternal Grandfather 12    MI  . Arthritis Father   . Coronary artery disease Father 25    MI  . Arthritis Mother   . Arthritis Maternal Grandfather   . Arthritis Maternal Grandmother   . Migraines Maternal Grandmother   . Migraines Sister   . Coronary artery disease Paternal Uncle   . Diabetes Neg Hx   . Stroke Neg Hx   . Cancer Neg Hx    History  Substance Use Topics  . Smoking status: Never Smoker   . Smokeless tobacco:  Never Used  . Alcohol Use: Yes     Comment: social   OB History   Grav Para Term Preterm Abortions TAB SAB Ect Mult Living                 Review of Systems  All other systems reviewed and are negative.     Allergies  Morphine and related  Home Medications   Prior to Admission medications   Medication Sig Start Date End Date Taking? Authorizing Provider  amphetamine-dextroamphetamine (ADDERALL) 30 MG tablet Take 30 mg by mouth 2 (two) times daily.    Historical Provider, MD  aspirin EC 81 MG tablet Take 81 mg by mouth daily.    Historical Provider, MD  Cholecalciferol (VITAMIN D-3 PO) Take 1,000 Units by mouth daily.    Historical Provider, MD  clonazePAM (KLONOPIN) 1 MG tablet Take 1-2 mg by mouth See admin instructions. For anxiety. May take 1 mg by mouth up to 3 times daily as needed for anxiety, may take up to 2 mg by mouth at bedtime as needed for sleep    Historical Provider, MD  cyclobenzaprine (FLEXERIL) 10 MG tablet Take 1 tablet (10 mg total) by mouth 2 (two) times daily as needed for muscle spasms. 10/31/13   Hilario Quarry, MD  dexlansoprazole (DEXILANT) 60 MG capsule Take 60 mg by mouth daily.    Historical Provider, MD  ibuprofen (ADVIL,MOTRIN) 200 MG tablet You can take 2-3 of these every 6 hours as needed. 07/05/13   Sherrie George, PA-C  labetalol (NORMODYNE) 100 MG tablet Take 100 mg by mouth 2 (two) times daily.    Historical Provider, MD  oxyCODONE-acetaminophen (PERCOCET/ROXICET) 5-325 MG per tablet Take 1-2 tablets by mouth every 4 (four) hours as needed for moderate pain. 07/05/13   Sherrie George, PA-C  venlafaxine XR (EFFEXOR-XR) 75 MG 24 hr capsule Take 225 mg by mouth daily with breakfast.    Historical Provider, MD   BP 129/82  Pulse 70  Temp(Src) 98.2 F (36.8 C) (Oral)  Resp 16  Ht  (1.626 m)  Wt 175 lb (79.379 kg)  BMI 30.02 kg/m2  SpO2 100%  LMP 10/07/2013 Physical Exam  Nursing note and vitals reviewed. Constitutional: She is  oriented to person, place, and time. She appears well-developed and well-nourished.  HENT:  Head: Normocephalic and atraumatic.  Right Ear: External ear normal.  Left Ear: External ear normal.  Nose: Nose normal.  Mouth/Throat: Oropharynx is clear and moist.  Eyes: Conjunctivae and EOM are normal. Pupils are equal, round, and reactive to light.  Neck: Normal range of motion. Neck supple.  Cardiovascular: Normal rate, regular rhythm, normal heart sounds and intact distal pulses.   Pulmonary/Chest: Effort normal and breath sounds normal.  Abdominal: Soft. Bowel sounds are normal.  Musculoskeletal: Normal range of motion.  Full arom right knee, ankle and hip, no redness or swelling noted.  No ligamentous laxity or point ttp.  Patient neurovascularly intact distal to injury.   Neurological: She is alert and oriented to person, place, and time. She has normal reflexes.  Skin: Skin is warm and dry.  Psychiatric: She has a normal mood and affect. Her behavior is normal. Judgment and thought content normal.    ED Course  Procedures (including critical care time) Labs Review Labs Reviewed - No data to display  Imaging Review No results found.   EKG Interpretation None      MDM   Final diagnoses:  Right knee pain  Sciatica, unspecified laterality        Hilario Quarry, MD 11/02/13 1452

## 2013-11-03 ENCOUNTER — Encounter: Payer: Self-pay | Admitting: Family Medicine

## 2013-11-03 DIAGNOSIS — M25561 Pain in right knee: Secondary | ICD-10-CM | POA: Insufficient documentation

## 2013-11-03 NOTE — Progress Notes (Signed)
Patient ID: Shelly Brown, female   DOB: Nov 27, 1965, 48 y.o.   MRN: [id number]  PCP: Eustaquio Boyden, MD  Subjective:   HPI: Patient is a 48 y.o. female here for right knee pain.  Patient denies known injury. States right knee started hurting about 2 weeks ago. Some slight swelling, some popping. Has done a lot of moving around, squatting recently and thinks this may have started the pain. No locking, catching, or giving out. Taking ibuprofen. Some prior issues with this knee but always went away. Current pain 4/10 level.  Past Medical History  Diagnosis Date  . Migraine   . Hypertension   . Anxiety and depression     mainly anxiety, Dr. Westley Chandler  . Insomnia   . Arthritis   . GERD (gastroesophageal reflux disease)     chronic gastritis on EGD 12/2012  . Seasonal allergies   . History of chicken pox   . Cholelithiasis 05/2013  . Hyperlipidemia   . OSA (obstructive sleep apnea)     trouble using CPAP  . Anxiety   . Depression   . Anemia     hx  . Anxiety state, unspecified 08/16/2013    Current Outpatient Prescriptions on File Prior to Visit  Medication Sig Dispense Refill  . amphetamine-dextroamphetamine (ADDERALL) 30 MG tablet Take 30 mg by mouth 2 (two) times daily.      Marland Kitchen aspirin EC 81 MG tablet Take 81 mg by mouth daily.      . Cholecalciferol (VITAMIN D-3 PO) Take 1,000 Units by mouth daily.      . clonazePAM (KLONOPIN) 1 MG tablet Take 1-2 mg by mouth See admin instructions. For anxiety. May take 1 mg by mouth up to 3 times daily as needed for anxiety, may take up to 2 mg by mouth at bedtime as needed for sleep      . cyclobenzaprine (FLEXERIL) 10 MG tablet Take 1 tablet (10 mg total) by mouth 2 (two) times daily as needed for muscle spasms.  20 tablet  0  . dexlansoprazole (DEXILANT) 60 MG capsule Take 60 mg by mouth daily.      Marland Kitchen ibuprofen (ADVIL,MOTRIN) 200 MG tablet You can take 2-3 of these every 6 hours as needed.  30 tablet  0  . labetalol (NORMODYNE) 100 MG  tablet Take 100 mg by mouth 2 (two) times daily.      Marland Kitchen venlafaxine XR (EFFEXOR-XR) 75 MG 24 hr capsule Take 225 mg by mouth daily with breakfast.       No current facility-administered medications on file prior to visit.    Past Surgical History  Procedure Laterality Date  . Cesarean section  2003  . Wisdom tooth extraction  1998  . Cervical spine surgery  05/03/2010    with fusion for herniated disc  . Breast reduction surgery  12/2010  . Esophagogastroduodenoscopy  12/2012    mild chronic gastritis, rec dexilant [facility])  . Cholecystectomy N/A 07/04/2013    LAPAROSCOPIC CHOLECYSTECTOMY WITH INTRAOPERATIVE CHOLANGIOGRAM;  Surgeon: Robyne Askew, MD    Allergies  Allergen Reactions  . Morphine And Related Other (See Comments)    Hallucinations-     History   Social History  . Marital Status: Single    Spouse Name: N/A    Number of Children: 1  . Years of Education: N/A   Occupational History  . Stay at home mom    Social History Main Topics  . Smoking status: Never Smoker   .  Smokeless tobacco: Never Used  . Alcohol Use: Yes     Comment: social  . Drug Use: No  . Sexual Activity: Yes    Partners: Male    Birth Control/ Protection: IUD   Other Topics Concern  . Not on file   Social History Narrative   Caffeine: 2-3 cups   Lives with husband and daughter (2003), 1 dog   Occupation: stay at home mom, currently unemployed, unemployment runs out in Dec   Activity: no regular activity, wants to start   Diet: good fruits and vegetables, good water.      Family History  Problem Relation Age of Onset  . Coronary artery disease Paternal Grandfather 35    MI  . Arthritis Father   . Coronary artery disease Father 63    MI  . Arthritis Mother   . Arthritis Maternal Grandfather   . Arthritis Maternal Grandmother   . Migraines Maternal Grandmother   . Migraines Sister   . Coronary artery disease Paternal Uncle   . Diabetes Neg Hx   . Stroke Neg Hx   .  Cancer Neg Hx     BP 128/77  Pulse 76  Ht  (1.626 m)  Wt 175 lb (79.379 kg)  BMI 30.02 kg/m2  LMP 10/07/2013  Review of Systems: See HPI above.    Objective:  Physical Exam:  Gen: NAD  Right knee: No gross deformity, ecchymoses, effusion. Mild TTP post patellar facets and medial joint line.  No lateral joint line tenderness. FROM. Negative ant/post drawers. Negative valgus/varus testing. Negative lachmanns. Negative mcmurrays, apleys, patellar apprehension. NV intact distally.    Assessment & Plan:  1. Right knee pain - consistent with a flare of DJD.  Discussed tylenol, nsaids, glucosamine, capsaicin.  Reviewed a home exercise program.  Consider injection - declined at this time.  Consider physical therapy as well.  Heat or ice, arch supports.  F/u in 5-6 weeks.

## 2013-11-03 NOTE — Assessment & Plan Note (Signed)
consistent with a flare of DJD.  Discussed tylenol, nsaids, glucosamine, capsaicin.  Reviewed a home exercise program.  Consider injection - declined at this time.  Consider physical therapy as well.  Heat or ice, arch supports.  F/u in 5-6 weeks.

## 2013-11-30 ENCOUNTER — Ambulatory Visit (INDEPENDENT_AMBULATORY_CARE_PROVIDER_SITE_OTHER): Payer: BC Managed Care – PPO | Admitting: Psychology

## 2013-11-30 DIAGNOSIS — F4323 Adjustment disorder with mixed anxiety and depressed mood: Secondary | ICD-10-CM

## 2013-12-07 ENCOUNTER — Encounter: Payer: Self-pay | Admitting: Family Medicine

## 2013-12-07 ENCOUNTER — Ambulatory Visit (INDEPENDENT_AMBULATORY_CARE_PROVIDER_SITE_OTHER): Payer: BC Managed Care – PPO | Admitting: Family Medicine

## 2013-12-07 VITALS — BP 135/88 | HR 80 | Ht 64.0 in | Wt 180.0 lb

## 2013-12-07 DIAGNOSIS — M25561 Pain in right knee: Secondary | ICD-10-CM

## 2013-12-07 NOTE — Patient Instructions (Signed)
Continue with what you're doing for your knee. Home exercises for another 6 weeks. Ibuprofen or naproxen as you have been. Consider Glucosamine sulfate 750mg  twice a day is a supplement that may help. Consider Capsaicin topically up to four times a day may also help with pain. Cortisone injections are an option - call me if you want to try one of these. Straight leg raises, leg raises with foot turned outward, side leg raises 3 sets of 10 once a day (add ankle weight if these become too easy). Consider physical therapy to strengthen muscles around the joint that hurts to take pressure off of the joint itself. Shoe inserts with good arch support may be helpful. Heat or ice 15 minutes at a time 3-4 times a day as needed to help with pain. Water aerobics and cycling with low resistance are the best two types of exercise for arthritis. Follow up with me in 5-6 weeks for reevaluation or as needed.

## 2013-12-08 ENCOUNTER — Encounter: Payer: Self-pay | Admitting: Family Medicine

## 2013-12-08 NOTE — Assessment & Plan Note (Signed)
consistent with a flare of DJD.  She feels improved symptomatically.  Continue with home exercise program, ibuprofen or naproxen.  Consider tylenol, glucosamine, capsaicin.  Consider injection, physical therapy if doesn't continue to improve.  F/u in 5-6 weeks.

## 2013-12-08 NOTE — Progress Notes (Signed)
Patient ID: Shelly Brown, female   DOB: 1965-06-01, 48 y.o.   MRN: [id number]  PCP: Eustaquio BoydenJavier Gutierrez, MD  Subjective:   HPI: Patient is a 48 y.o. female here for right knee pain.  9/23: Patient denies known injury. States right knee started hurting about 2 weeks ago. Some slight swelling, some popping. Has done a lot of moving around, squatting recently and thinks this may have started the pain. No locking, catching, or giving out. Taking ibuprofen. Some prior issues with this knee but always went away. Current pain 4/10 level.  10/28: Patient reports she is doing better, about 75% improved. Doing home exercise program. Taking ibuprofen or naproxen as needed. No swelling in knee. No catching, locking, giving out.  Past Medical History  Diagnosis Date  . Migraine   . Hypertension   . Anxiety and depression     mainly anxiety, Dr. Westley ChandlerKarr  . Insomnia   . Arthritis   . GERD (gastroesophageal reflux disease)     chronic gastritis on EGD 12/2012  . Seasonal allergies   . History of chicken pox   . Cholelithiasis 05/2013  . Hyperlipidemia   . OSA (obstructive sleep apnea)     trouble using CPAP  . Anxiety   . Depression   . Anemia     hx  . Anxiety state, unspecified 08/16/2013    Current Outpatient Prescriptions on File Prior to Visit  Medication Sig Dispense Refill  . amphetamine-dextroamphetamine (ADDERALL) 30 MG tablet Take 30 mg by mouth 2 (two) times daily.      Marland Kitchen. aspirin EC 81 MG tablet Take 81 mg by mouth daily.      . Cholecalciferol (VITAMIN D-3 PO) Take 1,000 Units by mouth daily.      . clonazePAM (KLONOPIN) 1 MG tablet Take 1-2 mg by mouth See admin instructions. For anxiety. May take 1 mg by mouth up to 3 times daily as needed for anxiety, may take up to 2 mg by mouth at bedtime as needed for sleep      . cyclobenzaprine (FLEXERIL) 10 MG tablet Take 1 tablet (10 mg total) by mouth 2 (two) times daily as needed for muscle spasms.  20 tablet  0  . dexlansoprazole  (DEXILANT) 60 MG capsule Take 60 mg by mouth daily.      Marland Kitchen. ibuprofen (ADVIL,MOTRIN) 200 MG tablet You can take 2-3 of these every 6 hours as needed.  30 tablet  0  . labetalol (NORMODYNE) 100 MG tablet Take 100 mg by mouth 2 (two) times daily.      Marland Kitchen. venlafaxine XR (EFFEXOR-XR) 75 MG 24 hr capsule Take 225 mg by mouth daily with breakfast.       No current facility-administered medications on file prior to visit.    Past Surgical History  Procedure Laterality Date  . Cesarean section  2003  . Wisdom tooth extraction  1998  . Cervical spine surgery  05/03/2010    with fusion for herniated disc  . Breast reduction surgery  12/2010  . Esophagogastroduodenoscopy  12/2012    mild chronic gastritis, rec dexilant [facility])  . Cholecystectomy N/A 07/04/2013    LAPAROSCOPIC CHOLECYSTECTOMY WITH INTRAOPERATIVE CHOLANGIOGRAM;  Surgeon: Robyne AskewPaul S Toth III, MD    Allergies  Allergen Reactions  . Morphine And Related Other (See Comments)    Hallucinations-     History   Social History  . Marital Status: Single    Spouse Name: N/A    Number of Children: 1  .  Years of Education: N/A   Occupational History  . Stay at home mom    Social History Main Topics  . Smoking status: Never Smoker   . Smokeless tobacco: Never Used  . Alcohol Use: Yes     Comment: social  . Drug Use: No  . Sexual Activity: Yes    Partners: Male    Birth Control/ Protection: IUD   Other Topics Concern  . Not on file   Social History Narrative   Caffeine: 2-3 cups   Lives with husband and daughter (2003), 1 dog   Occupation: stay at home mom, currently unemployed, unemployment runs out in Dec   Activity: no regular activity, wants to start   Diet: good fruits and vegetables, good water.      Family History  Problem Relation Age of Onset  . Coronary artery disease Paternal Grandfather [id number]    MI  . Arthritis Father   . Coronary artery disease Father [phone]    MI  . Arthritis Mother   . Arthritis Maternal  Grandfather   . Arthritis Maternal Grandmother   . Migraines Maternal Grandmother   . Migraines Sister   . Coronary artery disease Paternal Uncle   . Diabetes Neg Hx   . Stroke Neg Hx   . Cancer Neg Hx     BP 135/88  Pulse 80  Ht 5\' 4"  (1.626 m)  Wt 180 lb (81.647 kg)  BMI 30.88 kg/m2  Review of Systems: See HPI above.    Objective:  Physical Exam:  Gen: NAD  Right knee: No gross deformity, ecchymoses, effusion. Mild TTP post patellar facets and medial joint line.  No lateral joint line tenderness. FROM. Negative ant/post drawers. Negative valgus/varus testing. Negative lachmanns. Negative mcmurrays, apleys, patellar apprehension. NV intact distally.    Assessment & Plan:  1. Right knee pain - consistent with a flare of DJD.  She feels improved symptomatically.  Continue with home exercise program, ibuprofen or naproxen.  Consider tylenol, glucosamine, capsaicin.  Consider injection, physical therapy if doesn't continue to improve.  F/u in 5-6 weeks.

## 2013-12-21 ENCOUNTER — Other Ambulatory Visit: Payer: Self-pay | Admitting: Obstetrics and Gynecology

## 2013-12-22 LAB — CYTOLOGY - PAP

## 2013-12-28 ENCOUNTER — Ambulatory Visit: Payer: BC Managed Care – PPO | Admitting: Psychology

## 2014-01-19 ENCOUNTER — Other Ambulatory Visit: Payer: Self-pay

## 2014-01-23 ENCOUNTER — Ambulatory Visit (INDEPENDENT_AMBULATORY_CARE_PROVIDER_SITE_OTHER): Payer: BC Managed Care – PPO | Admitting: Psychology

## 2014-01-23 DIAGNOSIS — F4323 Adjustment disorder with mixed anxiety and depressed mood: Secondary | ICD-10-CM

## 2014-02-24 ENCOUNTER — Ambulatory Visit (INDEPENDENT_AMBULATORY_CARE_PROVIDER_SITE_OTHER): Payer: Medicaid Other | Admitting: Psychology

## 2014-02-24 DIAGNOSIS — F4323 Adjustment disorder with mixed anxiety and depressed mood: Secondary | ICD-10-CM | POA: Diagnosis not present

## 2014-04-11 ENCOUNTER — Encounter: Payer: Self-pay | Admitting: Cardiovascular Disease

## 2014-04-11 ENCOUNTER — Ambulatory Visit (INDEPENDENT_AMBULATORY_CARE_PROVIDER_SITE_OTHER): Payer: Medicaid Other | Admitting: Cardiovascular Disease

## 2014-04-11 VITALS — BP 124/80 | HR 93 | Ht 65.0 in | Wt 184.0 lb

## 2014-04-11 DIAGNOSIS — E785 Hyperlipidemia, unspecified: Secondary | ICD-10-CM

## 2014-04-11 DIAGNOSIS — I10 Essential (primary) hypertension: Secondary | ICD-10-CM

## 2014-04-11 NOTE — Progress Notes (Signed)
Cardiology Office Note   Date:  04/11/2014   ID:  Shelly Brown, DOB [birth year], MRN [id number]  PCP:  Eustaquio Boyden, MD  Cardiologist:   Elease Hashimoto Deloris Ping, MD   Chief Complaint  Patient presents with  . Hypertension   Problem List 1. Hypertension 2. Hyperlipidemia    History of Present Illness: Shelly Brown is a 49 y.o. female who presents at the request of Dr. Gaye Alken ( GYN) for hyperlipidemia and HTN. She sees a primary medical doctor who has already started her on statin.   She is also on BP meds . She's been found to have sinus tachycardia and was started on metoprolol. It should be noted that she also takes Adderall, Claritin-D, and Sudafed each day.  She denies any CP. She has seen Dr. Daleen Squibb in the past and was released.     Past Medical History  Diagnosis Date  . Migraine   . Hypertension   . Anxiety and depression     mainly anxiety, Dr. Westley Chandler  . Insomnia   . Arthritis   . GERD (gastroesophageal reflux disease)     chronic gastritis on EGD 12/2012  . Seasonal allergies   . History of chicken pox   . Cholelithiasis 05/2013  . Hyperlipidemia   . OSA (obstructive sleep apnea)     trouble using CPAP  . Anxiety   . Depression   . Anemia     hx  . Anxiety state, unspecified 08/16/2013    Past Surgical History  Procedure Laterality Date  . Cesarean section  2003  . Wisdom tooth extraction  1998  . Cervical spine surgery  05/03/2010    with fusion for herniated disc  . Breast reduction surgery  12/2010  . Esophagogastroduodenoscopy  12/2012    mild chronic gastritis, rec dexilant [facility])  . Cholecystectomy N/A 07/04/2013    LAPAROSCOPIC CHOLECYSTECTOMY WITH INTRAOPERATIVE CHOLANGIOGRAM;  Surgeon: Robyne Askew, MD     Current Outpatient Prescriptions  Medication Sig Dispense Refill  . amphetamine-dextroamphetamine (ADDERALL) 30 MG tablet Take 30 mg by mouth 2 (two) times daily.    Marland Kitchen aspirin EC 81 MG tablet Take 81 mg by mouth daily.    .  clonazePAM (KLONOPIN) 1 MG tablet Take 1-2 mg by mouth See admin instructions. For anxiety. May take 1 mg by mouth up to 3 times daily as needed for anxiety, may take up to 2 mg by mouth at bedtime as needed for sleep    . cyclobenzaprine (FLEXERIL) 10 MG tablet Take 1 tablet (10 mg total) by mouth 2 (two) times daily as needed for muscle spasms. 20 tablet 0  . ergocalciferol (VITAMIN D2) 50000 UNITS capsule Take 50,000 Units by mouth once a week.    . fluticasone (FLONASE) 50 MCG/ACT nasal spray Place 2 sprays into both nostrils daily as needed for allergies or rhinitis.    Marland Kitchen ibuprofen (ADVIL,MOTRIN) 200 MG tablet You can take 2-3 of these every 6 hours as needed. 30 tablet 0  . lisinopril-hydrochlorothiazide (PRINZIDE,ZESTORETIC) 20-12.5 MG per tablet Take 1 tablet by mouth daily.   10  . loratadine-pseudoephedrine (CLARITIN-D 24-HOUR) 10-240 MG per 24 hr tablet Take 1 tablet by mouth daily as needed for allergies.    . metoprolol succinate (TOPROL-XL) 25 MG 24 hr tablet Take 25 mg by mouth daily.    . phenylephrine (SUDAFED PE) 10 MG TABS tablet Take 10 mg by mouth as needed.    . pravastatin (PRAVACHOL) 20 MG tablet Take  20 mg by mouth daily.    Marland Kitchen. venlafaxine XR (EFFEXOR-XR) 75 MG 24 hr capsule Take 300 mg by mouth 2 (two) times daily.      No current facility-administered medications for this visit.    Allergies:   Morphine and related    Social History:  The patient  reports that she has never smoked. She has never used smokeless tobacco. She reports that she drinks alcohol. She reports that she does not use illicit drugs.   Family History:  The patient's family history includes Arthritis in her father, maternal grandfather, maternal grandmother, and mother; Coronary artery disease in her paternal uncle; Coronary artery disease (age of onset: 3153) in her father; Coronary artery disease (age of onset: 4058) in her paternal grandfather; Migraines in her maternal grandmother and sister. There is  no history of Diabetes, Stroke, or Cancer.  Her mother died of lung cancer last year.  She also had a stroke   ROS:  Please see the history of present illness.    Review of Systems: Constitutional:  denies fever, chills, diaphoresis, appetite change and fatigue.  HEENT: admits to  , ear pain,  Allergies   Respiratory: denies SOB, DOE, cough, chest tightness, and wheezing.  Cardiovascular: denies chest pain, palpitations and leg swelling.  Gastrointestinal: denies nausea, vomiting, abdominal pain, diarrhea, constipation, blood in stool.  Genitourinary: denies dysuria, urgency, frequency, hematuria, flank pain and difficulty urinating.  Musculoskeletal: denies  myalgias, back pain, joint swelling, arthralgias and gait problem.   Skin: denies pallor, rash and wound.  Neurological: denies dizziness, seizures, syncope, weakness, light-headedness, numbness and headaches.   Hematological: denies adenopathy, easy bruising, personal or family bleeding history.  Psychiatric/ Behavioral: denies suicidal ideation, mood changes, confusion, nervousness, sleep disturbance and agitation.       All other systems are reviewed and negative.    PHYSICAL EXAM: VS:  BP 124/80 mmHg  Pulse 93  Ht 5\' 5"  (1.651 m)  Wt 184 lb (83.462 kg)  BMI 30.62 kg/m2 , BMI Body mass index is 30.62 kg/(m^2). GEN: Well nourished, well developed, in no acute distress HEENT: normal Neck: no JVD, carotid bruits, or masses Cardiac: RRR; no murmurs, rubs, or gallops,no edema  Respiratory:  clear to auscultation bilaterally, normal work of breathing GI: soft, nontender, nondistended, + BS MS: no deformity or atrophy Skin: warm and dry, no rash Neuro:  Strength and sensation are intact Psych: normal   EKG:  EKG is ordered today. The ekg ordered today demonstrates NSR at 93.  No ST or T wave changes.    Recent Labs: 07/03/2013: ALT 13; BUN 8; Creatinine 0.76; Hemoglobin 14.8; Platelets 291; Potassium 3.9; Sodium 138      Lipid Panel    Component Value Date/Time   CHOL 206* 05/21/2011 0848   TRIG 154.0* 05/21/2011 0848   HDL 52.00 05/21/2011 0848   CHOLHDL 4 05/21/2011 0848   VLDL 30.8 05/21/2011 0848   LDLCALC 125* 12/03/2009 0833   LDLDIRECT 132.6 05/21/2011 0848      Wt Readings from Last 3 Encounters:  04/11/14 184 lb (83.462 kg)  12/07/13 180 lb (81.647 kg)  11/02/13 175 lb (79.379 kg)      Other studies Reviewed: Additional studies/ records that were reviewed today include: records from Dr. Gaye AlkenMcCombs. Review of the above records demonstrates:   ASSESSMENT AND PLAN:  1.  Essential hypertension: Shelly Brown has been found have essential hypertension. She's currently on low-dose metoprolol and lisinopril HCTZ. Her blood pressure is well-controlled. We will  have her continue to follow-up with her general medical doctor.   2. Sinus tachycardia: The patient was found have sinus tachycardia. She's currently on Adderall, Claritin-D, and Sudafed.    I think that these 3 medications are the reason why she has sinus tachycardia. I recommended that she stop the Claritin-D and the Sudafed intake allergy medicines that do  not increase her heart rate.  She has informed me that she needs to take Adderall.  3. Hyperlipidemia: The patient has hyperlipidemia. She was recently started on a statin by her primary medical doctor. She'll continue to follow-up with her primary medical doctor. I will see her on an as-needed basis.   Current medicines are reviewed at length with the patient today.  The patient does not have concerns regarding medicines.  The following changes have been made:  no change   Disposition:   FU with me as needed.      Signed, Lasandra Batley, Deloris Ping, MD  04/11/2014 4:12 PM    [facility] [address] [place], [place], [place]  [address] Phone: [phone]; Fax: [phone]

## 2014-04-11 NOTE — Patient Instructions (Signed)
Your physician recommends that you continue on your current medications as directed. Please refer to the Current Medication list given to you today.  Your physician recommends that you schedule a follow-up appointment in: as needed with Dr. Nahser  

## 2014-05-24 ENCOUNTER — Emergency Department (HOSPITAL_BASED_OUTPATIENT_CLINIC_OR_DEPARTMENT_OTHER): Payer: Medicaid Other

## 2014-05-24 ENCOUNTER — Encounter (HOSPITAL_BASED_OUTPATIENT_CLINIC_OR_DEPARTMENT_OTHER): Payer: Self-pay | Admitting: *Deleted

## 2014-05-24 ENCOUNTER — Emergency Department (HOSPITAL_BASED_OUTPATIENT_CLINIC_OR_DEPARTMENT_OTHER)
Admission: EM | Admit: 2014-05-24 | Discharge: 2014-05-24 | Disposition: A | Discharge status: Home / Self Care | Payer: Medicaid Other | Attending: Emergency Medicine | Admitting: Emergency Medicine

## 2014-05-24 DIAGNOSIS — F329 Major depressive disorder, single episode, unspecified: Secondary | ICD-10-CM | POA: Diagnosis not present

## 2014-05-24 DIAGNOSIS — Z9981 Dependence on supplemental oxygen: Secondary | ICD-10-CM | POA: Diagnosis not present

## 2014-05-24 DIAGNOSIS — G43909 Migraine, unspecified, not intractable, without status migrainosus: Secondary | ICD-10-CM | POA: Diagnosis not present

## 2014-05-24 DIAGNOSIS — R079 Chest pain, unspecified: Secondary | ICD-10-CM | POA: Insufficient documentation

## 2014-05-24 DIAGNOSIS — I10 Essential (primary) hypertension: Secondary | ICD-10-CM | POA: Insufficient documentation

## 2014-05-24 DIAGNOSIS — M1990 Unspecified osteoarthritis, unspecified site: Secondary | ICD-10-CM | POA: Diagnosis not present

## 2014-05-24 DIAGNOSIS — Z862 Personal history of diseases of the blood and blood-forming organs and certain disorders involving the immune mechanism: Secondary | ICD-10-CM | POA: Insufficient documentation

## 2014-05-24 DIAGNOSIS — F419 Anxiety disorder, unspecified: Secondary | ICD-10-CM | POA: Diagnosis not present

## 2014-05-24 DIAGNOSIS — G588 Other specified mononeuropathies: Secondary | ICD-10-CM | POA: Diagnosis not present

## 2014-05-24 DIAGNOSIS — Z79899 Other long term (current) drug therapy: Secondary | ICD-10-CM | POA: Diagnosis not present

## 2014-05-24 DIAGNOSIS — G4700 Insomnia, unspecified: Secondary | ICD-10-CM | POA: Insufficient documentation

## 2014-05-24 DIAGNOSIS — Z8619 Personal history of other infectious and parasitic diseases: Secondary | ICD-10-CM | POA: Diagnosis not present

## 2014-05-24 DIAGNOSIS — F13930 Sedative, hypnotic or anxiolytic use, unspecified with withdrawal, uncomplicated: Secondary | ICD-10-CM

## 2014-05-24 DIAGNOSIS — G4733 Obstructive sleep apnea (adult) (pediatric): Secondary | ICD-10-CM | POA: Diagnosis not present

## 2014-05-24 DIAGNOSIS — Z8719 Personal history of other diseases of the digestive system: Secondary | ICD-10-CM | POA: Insufficient documentation

## 2014-05-24 DIAGNOSIS — E785 Hyperlipidemia, unspecified: Secondary | ICD-10-CM | POA: Diagnosis not present

## 2014-05-24 DIAGNOSIS — Z7982 Long term (current) use of aspirin: Secondary | ICD-10-CM | POA: Insufficient documentation

## 2014-05-24 DIAGNOSIS — F13230 Sedative, hypnotic or anxiolytic dependence with withdrawal, uncomplicated: Secondary | ICD-10-CM | POA: Insufficient documentation

## 2014-05-24 DIAGNOSIS — R202 Paresthesia of skin: Secondary | ICD-10-CM | POA: Diagnosis present

## 2014-05-24 LAB — CBC WITH DIFFERENTIAL/PLATELET
BASOS PCT: 0 % (ref 0–1)
Basophils Absolute: 0 10*3/uL (ref 0.0–0.1)
EOS ABS: 0.1 10*3/uL (ref 0.0–0.7)
Eosinophils Relative: 1 % (ref 0–5)
HEMATOCRIT: 41.1 % (ref 36.0–46.0)
HEMOGLOBIN: 13.7 g/dL (ref 12.0–15.0)
Lymphocytes Relative: 24 % (ref 12–46)
Lymphs Abs: 1.6 10*3/uL (ref 0.7–4.0)
MCH: 28.9 pg (ref 26.0–34.0)
MCHC: 33.3 g/dL (ref 30.0–36.0)
MCV: 86.7 fL (ref 78.0–100.0)
MONO ABS: 0.5 10*3/uL (ref 0.1–1.0)
Monocytes Relative: 8 % (ref 3–12)
Neutro Abs: 4.5 10*3/uL (ref 1.7–7.7)
Neutrophils Relative %: 67 % (ref 43–77)
Platelets: 296 10*3/uL (ref 150–400)
RBC: 4.74 MIL/uL (ref 3.87–5.11)
RDW: 12.7 % (ref 11.5–15.5)
WBC: 6.7 10*3/uL (ref 4.0–10.5)

## 2014-05-24 LAB — BASIC METABOLIC PANEL
Anion gap: 9 (ref 5–15)
BUN: 15 mg/dL (ref 6–23)
CHLORIDE: 103 mmol/L (ref 96–112)
CO2: 26 mmol/L (ref 19–32)
Calcium: 9.2 mg/dL (ref 8.4–10.5)
Creatinine, Ser: 0.66 mg/dL (ref 0.50–1.10)
Glucose, Bld: 115 mg/dL — ABNORMAL HIGH (ref 70–99)
POTASSIUM: 3.5 mmol/L (ref 3.5–5.1)
SODIUM: 138 mmol/L (ref 135–145)

## 2014-05-24 LAB — TROPONIN I: Troponin I: <0.03 ng/mL (ref <0.031)

## 2014-05-24 MED ORDER — CLONAZEPAM 0.5 MG PO TABS
1.0000 mg | ORAL_TABLET | Freq: Every day | ORAL | Status: DC
Start: 2014-05-24 — End: 2017-06-02

## 2014-05-24 MED ORDER — ASPIRIN 325 MG PO TABS
325.0000 mg | ORAL_TABLET | Freq: Once | ORAL | Status: AC
  Administered 2014-05-24: 325 mg via ORAL
  Filled 2014-05-24: qty 1

## 2014-05-24 MED ORDER — CLONAZEPAM 0.5 MG PO TABS
0.5000 mg | ORAL_TABLET | Freq: Once | ORAL | Status: DC
Start: 2014-05-24 — End: 2014-05-24
  Filled 2014-05-24: qty 1

## 2014-05-24 MED ORDER — LORAZEPAM 1 MG PO TABS
0.5000 mg | ORAL_TABLET | Freq: Once | ORAL | Status: AC
  Administered 2014-05-24: 0.5 mg via ORAL
  Filled 2014-05-24: qty 1

## 2014-05-24 NOTE — ED Notes (Signed)
Pt states she has apt with her md that writes for her klonopin on Friday, she called last week and they would not refill until she is seen in the office.

## 2014-05-24 NOTE — ED Provider Notes (Signed)
CSN: [id number]     Arrival date & time 05/24/14  1518 History   First MD Initiated Contact with Patient 05/24/14 1551     Chief Complaint  Patient presents with  . Tingling     (Consider location/radiation/quality/duration/timing/severity/associated sxs/prior Treatment) HPI Comments: Pt presents with bilateral symmetric arm "tingling" for about 4 days, symmetrically and ulnar fingertips and wrists, as well as intermittent tightness across chest since yesterday with some radiation to the jaw.  She's had no associated shortness of breath, nausea, vomiting, diarrhea, fevers or chills, lower extremity pain or edema.  She has been without her Klonopin for about 1 week and has been taking it for about 2 years.  She is scheduled to see her psychiatrist in 2 days.  He will be refilling it.   Patient is a 49 y.o. female presenting with neurologic complaint. The history is provided by the patient. No language interpreter was used.  Neurologic Problem This is a new problem. The current episode started more than 2 days ago (4 days). The problem has not changed since onset.Associated symptoms include chest pain. Pertinent negatives include no abdominal pain, no headaches and no shortness of breath. Nothing aggravates the symptoms. Nothing relieves the symptoms. She has tried nothing for the symptoms. The treatment provided no relief.    Past Medical History  Diagnosis Date  . Migraine   . Hypertension   . Anxiety and depression     mainly anxiety, Dr. Westley ChandlerKarr  . Insomnia   . Arthritis   . GERD (gastroesophageal reflux disease)     chronic gastritis on EGD 12/2012  . Seasonal allergies   . History of chicken pox   . Cholelithiasis 05/2013  . Hyperlipidemia   . OSA (obstructive sleep apnea)     trouble using CPAP  . Anxiety   . Depression   . Anemia     hx  . Anxiety state, unspecified 08/16/2013   Past Surgical History  Procedure Laterality Date  . Cesarean section  2003  . Wisdom tooth  extraction  1998  . Cervical spine surgery  05/03/2010    with fusion for herniated disc  . Breast reduction surgery  12/2010  . Esophagogastroduodenoscopy  12/2012    mild chronic gastritis, rec dexilant [facility])  . Cholecystectomy N/A 07/04/2013    LAPAROSCOPIC CHOLECYSTECTOMY WITH INTRAOPERATIVE CHOLANGIOGRAM;  Surgeon: Robyne AskewPaul S Toth III, MD   Family History  Problem Relation Age of Onset  . Coronary artery disease Paternal Grandfather [id number]    MI  . Arthritis Father   . Coronary artery disease Father 5653    MI  . Arthritis Mother   . Arthritis Maternal Grandfather   . Arthritis Maternal Grandmother   . Migraines Maternal Grandmother   . Migraines Sister   . Coronary artery disease Paternal Uncle   . Diabetes Neg Hx   . Cancer Neg Hx   . Heart attack Father   . Heart attack Paternal Grandfather   . Stroke Sister    History  Substance Use Topics  . Smoking status: Never Smoker   . Smokeless tobacco: Never Used  . Alcohol Use: Yes     Comment: social   OB History    No data available     Review of Systems  Constitutional: Negative for fever, chills, diaphoresis, activity change, appetite change and fatigue.  HENT: Negative for congestion, facial swelling, rhinorrhea and sore throat.   Eyes: Negative for photophobia and discharge.  Respiratory: Negative for cough, chest  tightness and shortness of breath.   Cardiovascular: Positive for chest pain. Negative for palpitations and leg swelling.  Gastrointestinal: Negative for nausea, vomiting, abdominal pain and diarrhea.  Endocrine: Negative for polydipsia and polyuria.  Genitourinary: Negative for dysuria, frequency, difficulty urinating and pelvic pain.  Musculoskeletal: Negative for back pain, arthralgias, neck pain and neck stiffness.  Skin: Negative for color change and wound.  Allergic/Immunologic: Negative for immunocompromised state.  Neurological: Negative for facial asymmetry, weakness, numbness and headaches.   Hematological: Does not bruise/bleed easily.  Psychiatric/Behavioral: Negative for confusion and agitation.      Allergies  Morphine and related  Home Medications   Prior to Admission medications   Medication Sig Start Date End Date Taking? Authorizing Provider  amphetamine-dextroamphetamine (ADDERALL) 30 MG tablet Take 30 mg by mouth 2 (two) times daily.    Historical Provider, MD  aspirin EC 81 MG tablet Take 81 mg by mouth daily.    Historical Provider, MD  clonazePAM (KLONOPIN) 0.5 MG tablet Take 2 tablets (1 mg total) by mouth at bedtime. 05/24/14   Toy Cookey, MD  cyclobenzaprine (FLEXERIL) 10 MG tablet Take 1 tablet (10 mg total) by mouth 2 (two) times daily as needed for muscle spasms. 10/31/13   Margarita Grizzle, MD  ergocalciferol (VITAMIN D2) 50000 UNITS capsule Take 50,000 Units by mouth once a week.    Historical Provider, MD  fluticasone (FLONASE) 50 MCG/ACT nasal spray Place 2 sprays into both nostrils daily as needed for allergies or rhinitis.    Historical Provider, MD  ibuprofen (ADVIL,MOTRIN) 200 MG tablet You can take 2-3 of these every 6 hours as needed. 07/05/13   Sherrie George, PA-C  lisinopril-hydrochlorothiazide (PRINZIDE,ZESTORETIC) 20-12.5 MG per tablet Take 1 tablet by mouth daily.  01/19/14   Historical Provider, MD  loratadine-pseudoephedrine (CLARITIN-D 24-HOUR) 10-240 MG per 24 hr tablet Take 1 tablet by mouth daily as needed for allergies.    Historical Provider, MD  metoprolol succinate (TOPROL-XL) 25 MG 24 hr tablet Take 25 mg by mouth daily.    Historical Provider, MD  phenylephrine (SUDAFED PE) 10 MG TABS tablet Take 10 mg by mouth as needed.    Historical Provider, MD  pravastatin (PRAVACHOL) 20 MG tablet Take 20 mg by mouth daily.    Historical Provider, MD  venlafaxine XR (EFFEXOR-XR) 75 MG 24 hr capsule Take 300 mg by mouth 2 (two) times daily.     Historical Provider, MD   BP 147/90 mmHg  Pulse 92  Temp(Src) 98.6 F (37 C) (Oral)  Resp 16   Ht  (1.651 m)  Wt 184 lb (83.462 kg)  BMI 30.62 kg/m2  SpO2 100% Physical Exam  Constitutional: She is oriented to person, place, and time. She appears well-developed and well-nourished. No distress.  HENT:  Head: Normocephalic and atraumatic.  Mouth/Throat: No oropharyngeal exudate.  Eyes: Pupils are equal, round, and reactive to light.  Neck: Normal range of motion. Neck supple.  Cardiovascular: Normal rate, regular rhythm and normal heart sounds.  Exam reveals no gallop and no friction rub.   No murmur heard. Pulmonary/Chest: Effort normal and breath sounds normal. No respiratory distress. She has no wheezes. She has no rales.  Abdominal: Soft. Bowel sounds are normal. She exhibits no distension and no mass. There is no tenderness. There is no rebound and no guarding.  Musculoskeletal: Normal range of motion. She exhibits no edema or tenderness.  Neurological: She is alert and oriented to person, place, and time. She has normal strength. She  displays no tremor. A sensory deficit is present. No cranial nerve deficit. She exhibits normal muscle tone. Coordination normal. GCS eye subscore is 4. GCS verbal subscore is 5. GCS motor subscore is 6.  Dec sensation at wrists BL  Skin: Skin is warm and dry.  Psychiatric: She has a normal mood and affect.    ED Course  Procedures (including critical care time) Labs Review Labs Reviewed  BASIC METABOLIC PANEL - Abnormal; Notable for the following:    Glucose, Bld 115 (*)    All other components within normal limits  CBC WITH DIFFERENTIAL/PLATELET  TROPONIN I    Imaging Review Dg Chest 2 View  05/24/2014   CLINICAL DATA:  49 year old female with bilateral upper extremity tingling. Initial encounter. Current history of hypertension.  EXAM: CHEST  2 VIEW  COMPARISON:  06/29/2013 and earlier.  FINDINGS: Mildly improved lung volumes, within normal limits. Normal cardiac size and mediastinal contours. Visualized tracheal air column is within  normal limits. Lung parenchyma stable and clear. No pneumothorax or effusion. Chronic cervical ACDF hardware partially visible. Cholecystectomy clips. No acute osseous abnormality identified.  IMPRESSION: Negative, no acute cardiopulmonary abnormality.   Electronically Signed   By: Odessa Fleming M.D.   On: 05/24/2014 16:23     EKG Interpretation   Date/Time:  Wednesday May 24 2014 16:22:57 EDT Ventricular Rate:  75 PR Interval:  162 QRS Duration: 78 QT Interval:  368 QTC Calculation: 410 R Axis:   38 Text Interpretation:  Normal sinus rhythm Normal ECG Confirmed by Lebert Lovern   MD, Arvie Bartholomew ([id number]) on 05/24/2014 5:21:29 PM      MDM   Final diagnoses:  Benzodiazepine withdrawal without complication    Pt is a 49 y.o. female with Pmhx as above who presents with bilateral symmetric arm "tingling" for about 4 days as well as intermittent tightness across chest since yesterday with some radiation to the jaw.  She's had no associated shortness of breath, nausea, vomiting, diarrhea, fevers or chills, lower extremity pain or edema.  She has been without her Klonopin for about 1 week and has been taking it for about 2 years.  She is scheduled to see her psychiatrist in 2 days.  He will be refilling it.  On physical exam vitals are stable and she is in no acute distress.  She has subjective decreased fine touch at bilateral wrists, but nowhere else on physical exam, nurse's exam is otherwise unremarkable.  Current pulmonary exam is unremarkable.  Patient is given 325 of aspirin 0.5 by mouth, Klonopin.  EKG w/o acute ishemic changes, CBC, BMP and troponin unremarkable. Pt feels somewhat improved.  Do not feel symptoms are consistent with ACS, HEART score is 1, she is low risk for MACE.  Should be discharged home with 4 mg of Klonopin, 2 days' worth, until she can be seen by her psychiatrist on Friday.      Margarito Courser evaluation in the Emergency Department is complete. It has been determined that no acute  conditions requiring further emergency intervention are present at this time. The patient/guardian have been advised of the diagnosis and plan. We have discussed signs and symptoms that warrant return to the ED, such as changes or worsening in symptoms, worsening pain, shortness of breath, seizures.      Toy Cookey, MD 05/24/14 [phone]

## 2014-05-24 NOTE — Discharge Instructions (Signed)
Benzodiazepine Withdrawal  °Benzodiazepines are a group of drugs that are prescribed for both short-term and long-term treatment of a variety of medical conditions. For some of these conditions, such as seizures and sudden and severe muscle spasms, they are used only for a few hours or a few days. For other conditions, such as anxiety, sleep problems, or frequent muscle spasms or to help prevent seizures, they are used for an extended period, usually weeks or months. °Benzodiazepines work by changing the way your brain functions. Normally, chemicals in your brain called neurotransmitters send messages between your brain cells. The neurotransmitter that benzodiazepines affect is called gamma-aminobutyric acid (GABA). GABA sends out messages that have a calming effect on many of the functions of your brain. Benzodiazepines make these messages stronger and increase this calming effect. °Short-term use of benzodiazepines usually does not cause problems when you stop taking the drugs. However, if you take benzodiazepines for a long time, your body can adjust to the drug and require more of it to produce the same effect (drug tolerance). Eventually, you can develop physical dependence on benzodiazepines, which is when you experience negative effects if your dosage of benzodiazepines is reduced or stopped too quickly. These negative effects are called symptoms of withdrawal. °SYMPTOMS °Symptoms of withdrawal may begin anytime within the first 10 days after you stop taking the benzodiazepine. They can last from several weeks up to a few months but usually are the worst between the first 10 to 14 days.  °The actual symptoms also vary, depending on the type of benzodiazepine you take. Possible symptoms include: °· Anxiety. °· Excitability. °· Irritability. °· Depression. °· Mood swings. °· Trouble sleeping. °· Confusion. °· Uncontrollable shaking (tremors). °· Muscle weakness. °· Seizures. °DIAGNOSIS °To diagnose  benzodiazepine withdrawal, your caregiver will examine you for certain signs, such as: °· Rapid heartbeat. °· Rapid breathing. °· Tremors. °· High blood pressure. °· Fever. °· Mood changes. °Your caregiver also may ask the following questions about your use of benzodiazepines: °· What type of benzodiazepine did you take? °· How much did you take each day? °· How long did you take the drug? °· When was the last time you took the drug? °· Do you take any other drugs? °· Have you had alcohol recently? °· Have you had a seizure recently? °· Have you lost consciousness recently? °· Have you had trouble remembering recent events? °· Have you had a recent increase in anxiety, irritability, or trouble sleeping? °A drug test also may be administered. °TREATMENT °The treatment for benzodiazepine withdrawal can vary, depending on the type and severity of your symptoms, what type of benzodiazepine you have been taking, and how long you have been taking the benzodiazepine. Sometimes it is necessary for you to be treated in a hospital, especially if you are at risk of seizures.  °Often, treatment includes a prescription for a long-acting benzodiazepine, the dosage of which is reduced slowly over a long period. This period could be several weeks or months. Eventually, your dosage will be reduced to a point that you can stop taking the drug, without experiencing withdrawal symptoms. This is called tapered withdrawal. Occasionally, minor symptoms of withdrawal continue for a few days or weeks after you have completed a tapered withdrawal. °SEEK IMMEDIATE MEDICAL CARE IF: °· You have a seizure. °· You develop a craving for drugs or alcohol. °· You begin to experience symptoms of withdrawal during your tapered withdrawal. °· You become very confused. °· You lose consciousness. °· You   have trouble breathing. °· You think about hurting yourself or someone else. °Document Released: 01/16/2011 Document Revised: 04/21/2011 Document  Reviewed: 01/16/2011 °[employer]® Patient Information ©2015 [employer]. This information is not intended to replace advice given to you by your health care provider. Make sure you discuss any questions you have with your health care provider. ° °

## 2014-05-24 NOTE — ED Notes (Signed)
Patient preparing for discharge. 

## 2014-05-24 NOTE — ED Notes (Addendum)
Pt c/o bil arm "tingling" x 4 days pt states "out klonipin"

## 2015-04-17 IMAGING — DX DG CHEST 2V
2 series · 2 of 2 positions shown · non-contrast
Comparison: 06/29/2013 and earlier.

CLINICAL DATA: 49-year-old female with bilateral upper extremity
tingling. Initial encounter. Current history of hypertension.

EXAM:
CHEST  2 VIEW

[chest pa]
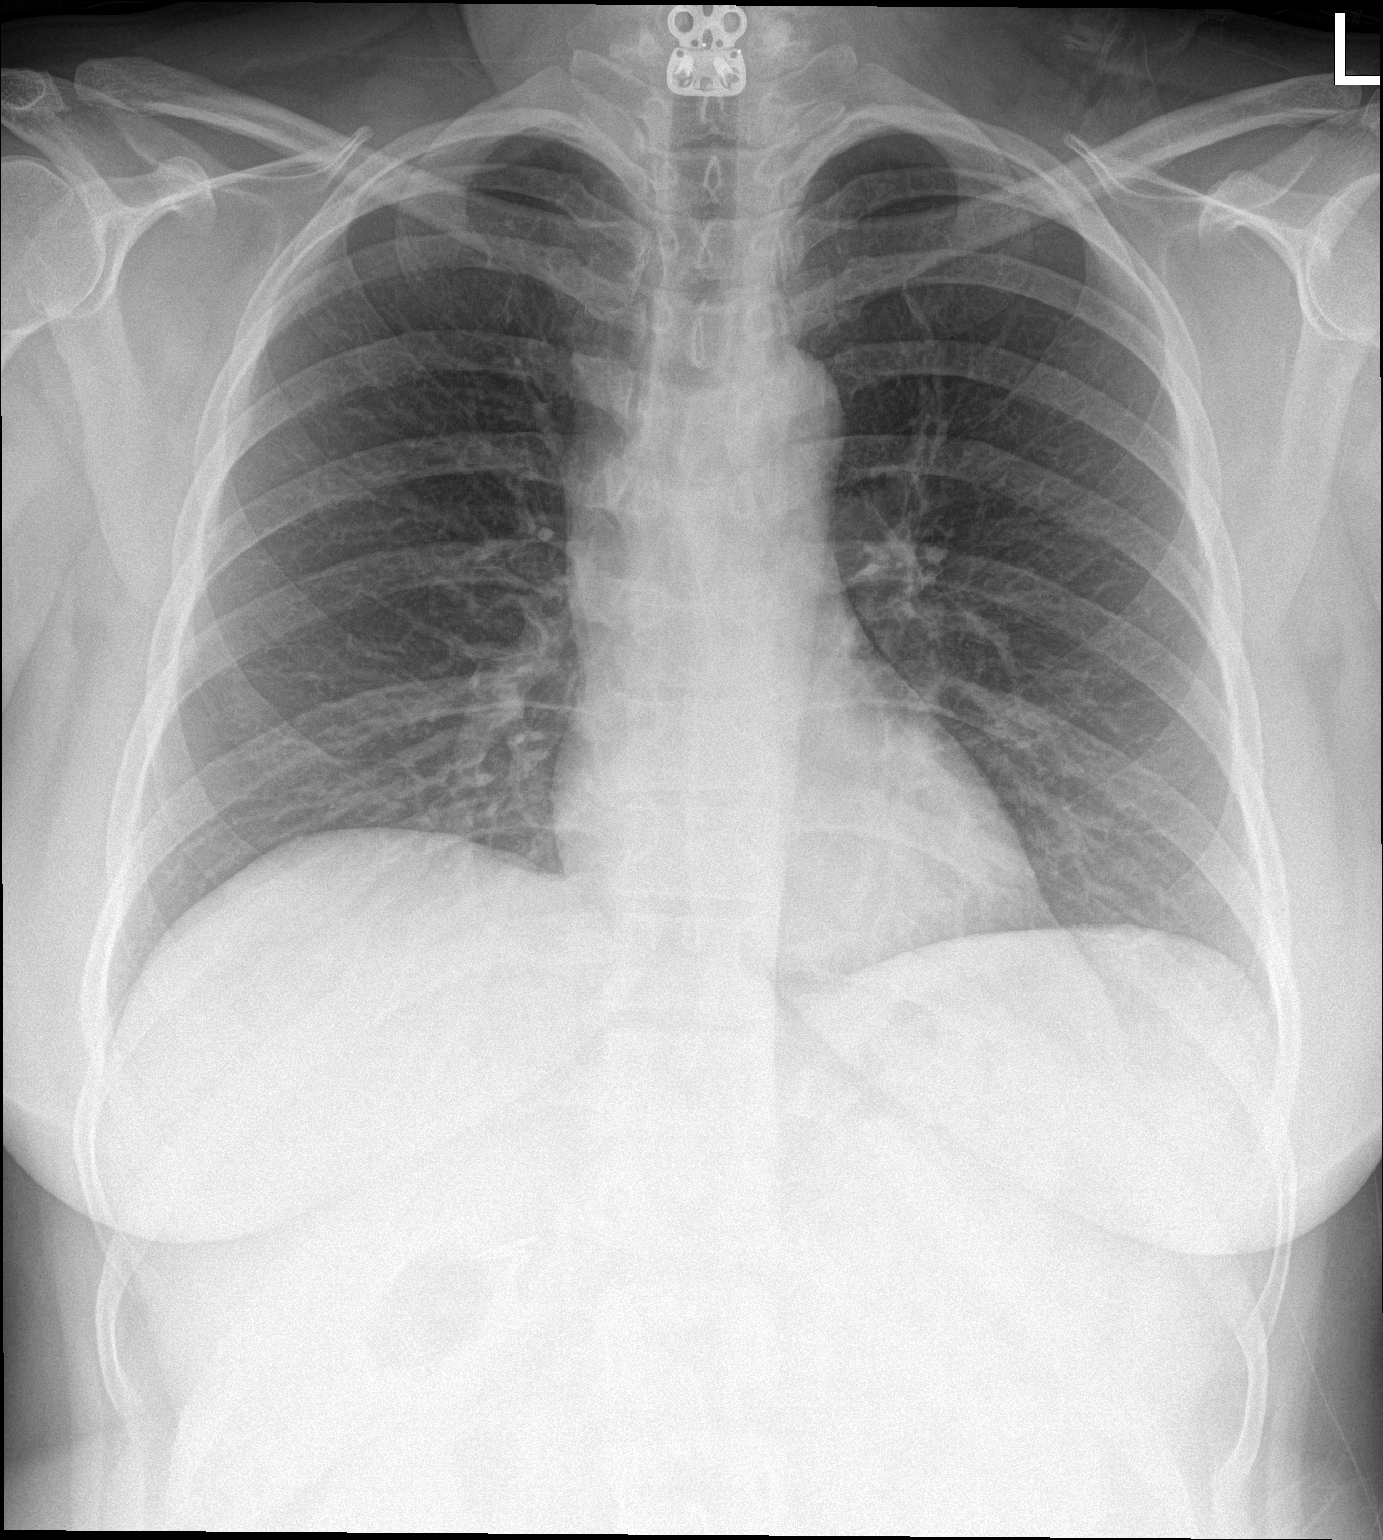

[chest lat]
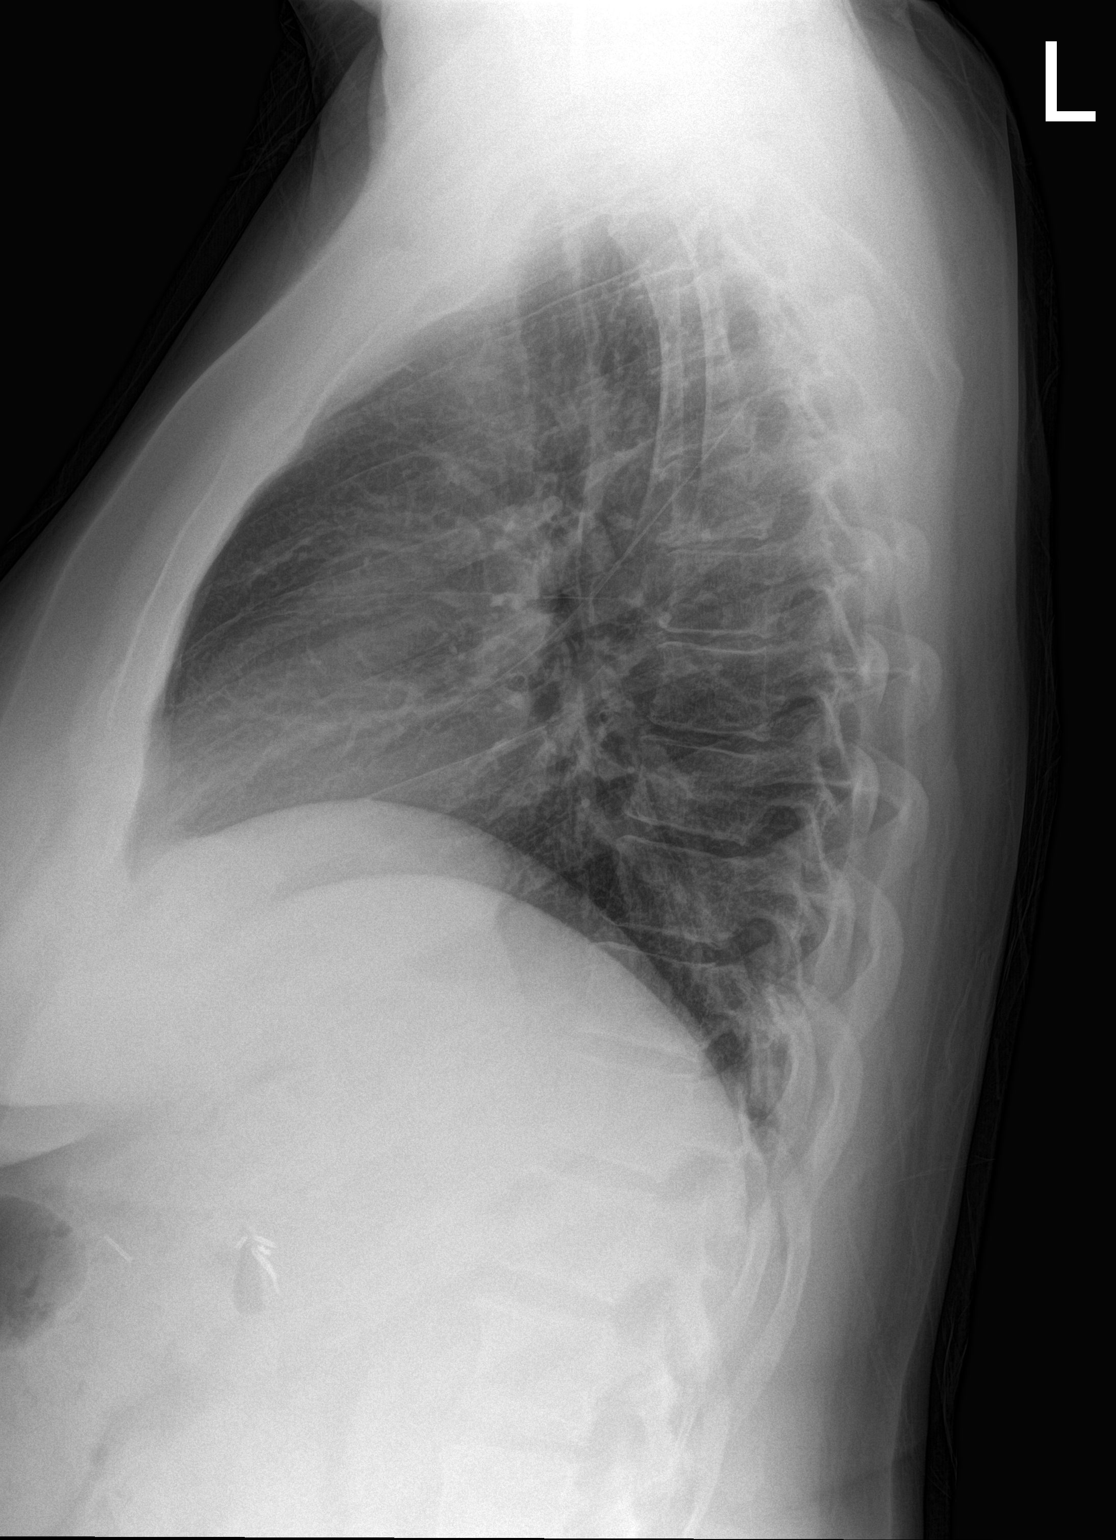

[2 of 2 positions shown; findings below may reference images not displayed]

FINDINGS: Mildly improved lung volumes, within normal limits. Normal cardiac
size and mediastinal contours. Visualized tracheal air column is
within normal limits. Lung parenchyma stable and clear. No
pneumothorax or effusion. Chronic cervical ACDF hardware partially
visible. Cholecystectomy clips. No acute osseous abnormality
identified.
IMPRESSION: Negative, no acute cardiopulmonary abnormality.

## 2015-10-07 ENCOUNTER — Emergency Department (HOSPITAL_COMMUNITY): Payer: Medicaid Other

## 2015-10-07 ENCOUNTER — Emergency Department (HOSPITAL_COMMUNITY)
Admission: EM | Admit: 2015-10-07 | Discharge: 2015-10-07 | Disposition: A | Discharge status: Home / Self Care | Payer: Medicaid Other | Attending: Emergency Medicine | Admitting: Emergency Medicine

## 2015-10-07 ENCOUNTER — Encounter (HOSPITAL_COMMUNITY): Payer: Self-pay | Admitting: Emergency Medicine

## 2015-10-07 DIAGNOSIS — S0101XA Laceration without foreign body of scalp, initial encounter: Secondary | ICD-10-CM | POA: Insufficient documentation

## 2015-10-07 DIAGNOSIS — Z79899 Other long term (current) drug therapy: Secondary | ICD-10-CM | POA: Insufficient documentation

## 2015-10-07 DIAGNOSIS — Z791 Long term (current) use of non-steroidal anti-inflammatories (NSAID): Secondary | ICD-10-CM | POA: Diagnosis not present

## 2015-10-07 DIAGNOSIS — Y929 Unspecified place or not applicable: Secondary | ICD-10-CM | POA: Diagnosis not present

## 2015-10-07 DIAGNOSIS — S0093XA Contusion of unspecified part of head, initial encounter: Secondary | ICD-10-CM

## 2015-10-07 DIAGNOSIS — F909 Attention-deficit hyperactivity disorder, unspecified type: Secondary | ICD-10-CM | POA: Insufficient documentation

## 2015-10-07 DIAGNOSIS — I10 Essential (primary) hypertension: Secondary | ICD-10-CM | POA: Diagnosis not present

## 2015-10-07 DIAGNOSIS — Y939 Activity, unspecified: Secondary | ICD-10-CM | POA: Insufficient documentation

## 2015-10-07 DIAGNOSIS — Y999 Unspecified external cause status: Secondary | ICD-10-CM | POA: Diagnosis not present

## 2015-10-07 DIAGNOSIS — Z7982 Long term (current) use of aspirin: Secondary | ICD-10-CM | POA: Diagnosis not present

## 2015-10-07 DIAGNOSIS — S0990XA Unspecified injury of head, initial encounter: Secondary | ICD-10-CM | POA: Diagnosis present

## 2015-10-07 DIAGNOSIS — T1490XA Injury, unspecified, initial encounter: Secondary | ICD-10-CM

## 2015-10-07 HISTORY — DX: Attention-deficit hyperactivity disorder, unspecified type: F90.9

## 2015-10-07 MED ORDER — TETRACAINE HCL 0.5 % OP SOLN: 1.0000 [drp] | Freq: Once | OPHTHALMIC | Status: DC

## 2015-10-07 MED ORDER — LIDOCAINE-EPINEPHRINE 2 %-1:100000 IJ SOLN: 20.0000 mL | Freq: Once | INTRAMUSCULAR | Status: DC

## 2015-10-07 MED ORDER — FLUORESCEIN SODIUM 1 MG OP STRP
1.0000 | ORAL_STRIP | Freq: Once | OPHTHALMIC | Status: DC
Start: 2015-10-07 — End: 2015-10-07

## 2015-10-07 NOTE — ED Provider Notes (Signed)
LACERATION REPAIR Performed by: Lottie MusselKIRICHENKO, Maeli Spacek A Authorized by: Jaynie CrumbleKIRICHENKO, Naeem Quillin A Consent: Verbal consent obtained. Risks and benefits: risks, benefits and alternatives were discussed Consent given by: patient Patient identity confirmed: provided demographic data Prepped and Draped in normal sterile fashion Wound explored  Laceration Location: posterior scalp  Laceration Length: 2cm  No Foreign Bodies seen or palpated  Anesthesia: none Irrigation method: syringe Amount of cleaning: standard  Skin closure: staples  Number of sutures: 2  Technique: staples  Patient tolerance: Patient tolerated the procedure well with no immediate complications.    Jaynie Crumbleatyana Dayvin Aber, PA-C 10/07/15 [phone]    Tilden FossaElizabeth Rees, MD 10/08/15 [phone]

## 2015-10-07 NOTE — ED Notes (Signed)
Patient d/c'd self care.  F/U reviewed.  Patient verbalized understanding. 
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
## 2015-10-12 ENCOUNTER — Encounter (HOSPITAL_COMMUNITY): Payer: Self-pay | Admitting: *Deleted

## 2015-10-12 ENCOUNTER — Emergency Department (HOSPITAL_COMMUNITY)
Admission: EM | Admit: 2015-10-12 | Discharge: 2015-10-12 | Disposition: A | Discharge status: Home / Self Care | Payer: Medicaid Other | Attending: Emergency Medicine | Admitting: Emergency Medicine

## 2015-10-12 DIAGNOSIS — Z79899 Other long term (current) drug therapy: Secondary | ICD-10-CM | POA: Diagnosis not present

## 2015-10-12 DIAGNOSIS — F0781 Postconcussional syndrome: Secondary | ICD-10-CM | POA: Diagnosis not present

## 2015-10-12 DIAGNOSIS — Z791 Long term (current) use of non-steroidal anti-inflammatories (NSAID): Secondary | ICD-10-CM | POA: Diagnosis not present

## 2015-10-12 DIAGNOSIS — Z7982 Long term (current) use of aspirin: Secondary | ICD-10-CM | POA: Insufficient documentation

## 2015-10-12 DIAGNOSIS — F909 Attention-deficit hyperactivity disorder, unspecified type: Secondary | ICD-10-CM | POA: Diagnosis not present

## 2015-10-12 DIAGNOSIS — I10 Essential (primary) hypertension: Secondary | ICD-10-CM | POA: Diagnosis not present

## 2015-10-12 DIAGNOSIS — Z4802 Encounter for removal of sutures: Secondary | ICD-10-CM | POA: Diagnosis not present

## 2015-10-12 NOTE — Discharge Instructions (Signed)
°  Please follow-up with neurology for further evaluation and management. Please return to emergency room if you experience any new or worsening signs or symptoms
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
## 2015-10-14 ENCOUNTER — Emergency Department (HOSPITAL_COMMUNITY)
Admission: EM | Admit: 2015-10-14 | Discharge: 2015-10-14 | Disposition: A | Discharge status: Home / Self Care | Payer: Medicaid Other | Attending: Emergency Medicine | Admitting: Emergency Medicine

## 2015-10-14 ENCOUNTER — Encounter (HOSPITAL_COMMUNITY): Payer: Self-pay | Admitting: Emergency Medicine

## 2015-10-14 ENCOUNTER — Emergency Department (HOSPITAL_COMMUNITY): Payer: Medicaid Other

## 2015-10-14 DIAGNOSIS — Z7982 Long term (current) use of aspirin: Secondary | ICD-10-CM | POA: Diagnosis not present

## 2015-10-14 DIAGNOSIS — I10 Essential (primary) hypertension: Secondary | ICD-10-CM | POA: Diagnosis not present

## 2015-10-14 DIAGNOSIS — Y929 Unspecified place or not applicable: Secondary | ICD-10-CM | POA: Insufficient documentation

## 2015-10-14 DIAGNOSIS — R0781 Pleurodynia: Secondary | ICD-10-CM

## 2015-10-14 DIAGNOSIS — Y999 Unspecified external cause status: Secondary | ICD-10-CM | POA: Insufficient documentation

## 2015-10-14 DIAGNOSIS — Z79899 Other long term (current) drug therapy: Secondary | ICD-10-CM | POA: Insufficient documentation

## 2015-10-14 DIAGNOSIS — S299XXA Unspecified injury of thorax, initial encounter: Secondary | ICD-10-CM | POA: Insufficient documentation

## 2015-10-14 DIAGNOSIS — Y939 Activity, unspecified: Secondary | ICD-10-CM | POA: Diagnosis not present

## 2015-10-14 DIAGNOSIS — F909 Attention-deficit hyperactivity disorder, unspecified type: Secondary | ICD-10-CM | POA: Diagnosis not present

## 2015-10-14 DIAGNOSIS — M62838 Other muscle spasm: Secondary | ICD-10-CM

## 2015-10-14 MED ORDER — METHOCARBAMOL 500 MG PO TABS: 500.0000 mg | ORAL_TABLET | Freq: Four times a day (QID) | ORAL | 0 refills | Status: DC | PRN

## 2015-10-14 MED ORDER — LIDOCAINE 5 % EX PTCH: 1.0000 | MEDICATED_PATCH | CUTANEOUS | 0 refills | Status: DC

## 2015-10-14 NOTE — ED Triage Notes (Signed)
Patient here with complaints of left sided rib injury. Reports being seen for same on 9/1. X-rays negative. Reports pain and tenderness to touch.
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
## 2015-10-14 NOTE — Discharge Instructions (Signed)
Read the information below.  Use the prescribed medication as directed.  Please discuss all new medications with your pharmacist.  You may return to the Emergency Department at any time for worsening condition or any new symptoms that concern you.   If you develop worsening chest pain, shortness of breath, fever, you pass out, or become weak or dizzy, return to the ER for a recheck.    °

## 2017-02-10 HISTORY — PX: REPLACEMENT TOTAL KNEE: SUR1224

## 2017-05-18 ENCOUNTER — Other Ambulatory Visit: Payer: Self-pay | Admitting: Orthopaedic Surgery

## 2017-06-03 NOTE — Pre-Procedure Instructions (Signed)
Shelly Brown  06/03/2017    Your procedure is scheduled on Tuesday, Jun 16, 2017 at 7:30 AM.   [facility] at 5:30 AM.   Call this number if you have problems the morning of [phone]   Questions prior to day of surgery, please call [phone] between 8 & 4 PM.   Remember:  Do not eat food or drink liquids after midnight Monday, 06/15/17.  Take these medicines the morning of surgery with A SIP OF WATER: Bupropion (Wellbutrin), Metoprolol (Lopressor), Omeprazole (Prilosec), Hydrocodone - prn  Stop Aspirin Products and NSAIDS (Naproxen, Aleve, Ibuprofen, etc) 7 days prior to surgery.   Do not wear jewelry, make-up or nail polish.  Do not wear lotions, powders, perfumes or deodorant.  Do not shave 48 hours prior to surgery.    Do not bring valuables to the hospital.  [facility] for any belongings or valuables.  Contacts, dentures or bridgework may not be worn into surgery.  Leave your suitcase in the car.  After surgery it may be brought to your room.  For patients admitted to the hospital, discharge time will be determined by your treatment team.  Patients discharged the day of surgery will not be allowed to drive home.   [facility] - Preparing for Surgery  Before surgery, you can play an important role.  Because skin is not sterile, your skin needs to be as free of germs as possible.  You can reduce the number of germs on you skin by washing with CHG (chlorahexidine gluconate) soap before surgery.  CHG is an antiseptic cleaner which kills germs and bonds with the skin to continue killing germs even after washing.  Please DO NOT use if you have an allergy to CHG or antibacterial soaps.  If your skin becomes reddened/irritated stop using the CHG and inform your nurse when you arrive at Short Stay.  Do not shave (including legs and underarms) for at least 48 hours prior to the first CHG shower.  You may  shave your face.  Please follow these instructions carefully:   1.  Shower with CHG Soap the night before surgery and the                    morning of Surgery.  2.  If you choose to wash your hair, wash your hair first as usual with your       normal shampoo.  3.  After you shampoo, rinse your hair and body thoroughly to remove the shampoo.  4.  Use CHG as you would any other liquid soap.  You can apply chg directly       to the skin and wash gently with scrungie or a clean washcloth.  5.  Apply the CHG Soap to your body ONLY FROM THE NECK DOWN.        Do not use on open wounds or open sores.  Avoid contact with your eyes, ears, mouth and genitals (private parts).  Wash genitals (private parts) with your normal soap.  6.  Wash thoroughly, paying special attention to the area where your surgery        will be performed.  7.  Thoroughly rinse your body with warm water from the neck down.  8.  DO NOT shower/wash with your normal soap after using and rinsing off       the CHG Soap.  9.  Pat yourself dry with a  clean towel.            10.  Wear clean pajamas.            11.  Place clean sheets on your bed the night of your first shower and do not        sleep with pets.  Day of Surgery  Do not apply any lotions/deodorants the morning of surgery.  Please wear clean clothes to the hospital.   Please read over the fact sheets that you were given.

## 2017-06-04 ENCOUNTER — Ambulatory Visit (HOSPITAL_COMMUNITY)
Admission: RE | Admit: 2017-06-04 | Discharge: 2017-06-04 | Disposition: A | Discharge status: Home / Self Care | Payer: Medicaid Other | Source: Ambulatory Visit | Attending: Orthopaedic Surgery | Admitting: Orthopaedic Surgery

## 2017-06-04 ENCOUNTER — Encounter (HOSPITAL_COMMUNITY)
Admission: RE | Admit: 2017-06-04 | Discharge: 2017-06-04 | Disposition: A | Discharge status: Home / Self Care | Payer: Medicaid Other | Source: Ambulatory Visit | Attending: Orthopaedic Surgery | Admitting: Orthopaedic Surgery

## 2017-06-04 ENCOUNTER — Other Ambulatory Visit: Payer: Self-pay

## 2017-06-04 ENCOUNTER — Other Ambulatory Visit (HOSPITAL_COMMUNITY): Payer: Self-pay | Admitting: *Deleted

## 2017-06-04 ENCOUNTER — Encounter (HOSPITAL_COMMUNITY): Payer: Self-pay

## 2017-06-04 ENCOUNTER — Encounter (HOSPITAL_COMMUNITY): Admission: RE | Admit: 2017-06-04 | Payer: Medicaid Other | Source: Ambulatory Visit

## 2017-06-04 DIAGNOSIS — R9431 Abnormal electrocardiogram [ECG] [EKG]: Secondary | ICD-10-CM | POA: Insufficient documentation

## 2017-06-04 DIAGNOSIS — Z0183 Encounter for blood typing: Secondary | ICD-10-CM | POA: Diagnosis not present

## 2017-06-04 DIAGNOSIS — Z01818 Encounter for other preprocedural examination: Secondary | ICD-10-CM | POA: Diagnosis present

## 2017-06-04 DIAGNOSIS — Z01812 Encounter for preprocedural laboratory examination: Secondary | ICD-10-CM | POA: Insufficient documentation

## 2017-06-04 LAB — CBC WITH DIFFERENTIAL/PLATELET
Basophils Absolute: 0 10*3/uL (ref 0.0–0.1)
Basophils Relative: 0 %
EOS ABS: 0.1 10*3/uL (ref 0.0–0.7)
EOS PCT: 1 %
HCT: 40.7 % (ref 36.0–46.0)
HEMOGLOBIN: 13.3 g/dL (ref 12.0–15.0)
LYMPHS ABS: 3.1 10*3/uL (ref 0.7–4.0)
LYMPHS PCT: 37 %
MCH: 26.4 pg (ref 26.0–34.0)
MCHC: 32.7 g/dL (ref 30.0–36.0)
MCV: 80.9 fL (ref 78.0–100.0)
MONOS PCT: 5 %
Monocytes Absolute: 0.5 10*3/uL (ref 0.1–1.0)
NEUTROS PCT: 57 %
Neutro Abs: 4.8 10*3/uL (ref 1.7–7.7)
Platelets: 282 10*3/uL (ref 150–400)
RBC: 5.03 MIL/uL (ref 3.87–5.11)
RDW: 13.3 % (ref 11.5–15.5)
WBC: 8.5 10*3/uL (ref 4.0–10.5)

## 2017-06-04 LAB — TYPE AND SCREEN
ABO/RH(D): A POS
ANTIBODY SCREEN: NEGATIVE

## 2017-06-04 LAB — BASIC METABOLIC PANEL
Anion gap: 11 (ref 5–15)
BUN: 9 mg/dL (ref 6–20)
CO2: 26 mmol/L (ref 22–32)
CREATININE: 0.76 mg/dL (ref 0.44–1.00)
Calcium: 9.3 mg/dL (ref 8.9–10.3)
Chloride: 98 mmol/L — ABNORMAL LOW (ref 101–111)
GFR calc Af Amer: >60 mL/min (ref >60)
GFR calc non Af Amer: >60 mL/min (ref >60)
Glucose, Bld: 97 mg/dL (ref 65–99)
POTASSIUM: 3.3 mmol/L — AB (ref 3.5–5.1)
Sodium: 135 mmol/L (ref 135–145)

## 2017-06-04 LAB — URINALYSIS, ROUTINE W REFLEX MICROSCOPIC
BILIRUBIN URINE: NEGATIVE
Glucose, UA: NEGATIVE mg/dL
Hgb urine dipstick: NEGATIVE
KETONES UR: NEGATIVE mg/dL
LEUKOCYTES UA: NEGATIVE
NITRITE: NEGATIVE
PROTEIN: NEGATIVE mg/dL
Specific Gravity, Urine: 1.005 (ref 1.005–1.030)
pH: 8 (ref 5.0–8.0)

## 2017-06-04 LAB — ABO/RH: ABO/RH(D): A POS

## 2017-06-04 LAB — PROTIME-INR
INR: 1.03
PROTHROMBIN TIME: 13.4 s (ref 11.4–15.2)

## 2017-06-04 LAB — SURGICAL PCR SCREEN
MRSA, PCR: NEGATIVE
Staphylococcus aureus: NEGATIVE

## 2017-06-04 LAB — APTT: aPTT: 30 seconds (ref 24–36)

## 2017-06-04 NOTE — Progress Notes (Signed)
Pt denies cardiac history, chest pain or sob. Pt states she is not diabetic. 

## 2017-06-10 NOTE — H&P (Signed)
TOTAL KNEE ADMISSION H&P  Patient is being admitted for right total knee arthroplasty.  Subjective:  Chief Complaint:right knee pain.  HPI: Shelly Brown, 52 y.o. female, has a history of pain and functional disability in the right knee due to arthritis and has failed non-surgical conservative treatments for greater than 12 weeks to includeNSAID's and/or analgesics, corticosteriod injections, viscosupplementation injections, flexibility and strengthening excercises, use of assistive devices, weight reduction as appropriate and activity modification.  Onset of symptoms was gradual, starting 5 years ago with gradually worsening course since that time. The patient noted no past surgery on the right knee(s).  Patient currently rates pain in the right knee(s) at 10 out of 10 with activity. Patient has night pain, worsening of pain with activity and weight bearing, pain that interferes with activities of daily living, crepitus and joint swelling.  Patient has evidence of subchondral cysts, subchondral sclerosis, periarticular osteophytes and joint space narrowing by imaging studies. There is no active infection.  Patient Active Problem List   Diagnosis Date Noted  . Right knee pain 11/03/2013  . Cholecystitis with cholelithiasis 07/04/2013  . Chronic cholecystitis with calculus 06/10/2013  . Healthcare maintenance 05/28/2011  . Abdominal cramping 05/28/2011  . OSA (obstructive sleep apnea)   . Anxiety and depression   . GERD (gastroesophageal reflux disease)   . Seasonal allergies   . Hx of migraine headaches 11/05/2010  . HYPERTENSION, BENIGN 10/04/2008  . Hyperlipidemia 09/29/2008  . HEADACHE 09/29/2008   Past Medical History:  Diagnosis Date  . ADHD (attention deficit hyperactivity disorder)   . Anemia    hx  . Anxiety   . Anxiety and depression    mainly anxiety, Dr. Westley Chandler  . Anxiety state, unspecified 08/16/2013  . Arthritis   . Cholelithiasis 05/2013  . Depression   . GERD  (gastroesophageal reflux disease)    chronic gastritis on EGD 12/2012  . History of chicken pox   . Hyperlipidemia   . Hypertension   . Insomnia   . Migraine   . OSA (obstructive sleep apnea)    trouble using CPAP  . Seasonal allergies     Past Surgical History:  Procedure Laterality Date  . BREAST REDUCTION SURGERY  12/2010  . CERVICAL SPINE SURGERY  05/03/2010   with fusion for herniated disc  . CESAREAN SECTION  2003  . CHOLECYSTECTOMY N/A 07/04/2013   LAPAROSCOPIC CHOLECYSTECTOMY WITH INTRAOPERATIVE CHOLANGIOGRAM;  Surgeon: Robyne Askew, MD  . ESOPHAGOGASTRODUODENOSCOPY  12/2012   mild chronic gastritis, rec dexilant Bing Matter)  . WISDOM TOOTH EXTRACTION  1998    No current facility-administered medications for this encounter.    Current Outpatient Medications  Medication Sig Dispense Refill Last Dose  . aspirin-acetaminophen-caffeine (EXCEDRIN MIGRAINE) 250-250-65 MG tablet Take 2 tablets by mouth daily as needed for migraine.     Marland Kitchen buPROPion (WELLBUTRIN XL) 150 MG 24 hr tablet Take 150 mg by mouth daily.     . clonazePAM (KLONOPIN) 1 MG tablet Take 1 mg by mouth at bedtime.     . hydrochlorothiazide (HYDRODIURIL) 25 MG tablet Take 25 mg by mouth daily.     Marland Kitchen HYDROcodone-acetaminophen (NORCO/VICODIN) 5-325 MG tablet Take 1 tablet by mouth 2 (two) times daily as needed for moderate pain.     Marland Kitchen lisdexamfetamine (VYVANSE) 60 MG capsule Take 60 mg by mouth every morning.     . metoprolol tartrate (LOPRESSOR) 50 MG tablet Take 50 mg by mouth 2 (two) times daily.     . naproxen (  NAPROSYN) 500 MG tablet Take 500 mg by mouth daily as needed for moderate pain.     Marland Kitchen omeprazole (PRILOSEC) 20 MG capsule Take 20 mg by mouth daily.     . pravastatin (PRAVACHOL) 20 MG tablet Take 20 mg by mouth at bedtime.    Taking  . QUEtiapine (SEROQUEL) 25 MG tablet Take 25 mg by mouth at bedtime.     . Simethicone (GAS-X PO) Take 2 tablets by mouth daily as needed (gas).     Marland Kitchen lidocaine  (LIDODERM) 5 % Place 1 patch onto the skin daily. Remove & Discard patch within 12 hours or as directed by MD (Patient not taking: Reported on 06/02/2017) 30 patch 0 Completed Course at Unknown time   Allergies  Allergen Reactions  . Morphine And Related Other (See Comments)    Hallucinations-     Social History   Tobacco Use  . Smoking status: Never Smoker  . Smokeless tobacco: Never Used  Substance Use Topics  . Alcohol use: Yes    Comment: social    Family History  Problem Relation Age of Onset  . Coronary artery disease Paternal Grandfather 64       MI  . Heart attack Paternal Grandfather   . Arthritis Father   . Coronary artery disease Father 59       MI  . Heart attack Father   . Arthritis Mother   . Lung cancer Mother   . Arthritis Maternal Grandfather   . Arthritis Maternal Grandmother   . Migraines Maternal Grandmother   . Migraines Sister   . Coronary artery disease Paternal Uncle   . Stroke Sister   . Diabetes Neg Hx   . Cancer Neg Hx      Review of Systems  Musculoskeletal: Positive for joint pain.       Right knee  All other systems reviewed and are negative.   Objective:  Physical Exam  Vital signs in last 24 hours:    Labs:   Estimated body mass index is 35.53 kg/m as calculated from the following:   Height as of 06/04/17:  (1.626 m).   Weight as of 06/04/17: 93.9 kg (207 lb).   Imaging Review Plain radiographs demonstrate severe degenerative joint disease of the right knee(s). The overall alignment isneutral. The bone quality appears to be good for age and reported activity level.   Preoperative templating of the joint replacement has been completed, documented, and submitted to the Operating Room personnel in order to optimize intra-operative equipment management.   Anticipated LOS equal to or greater than 2 midnights due to - Age 50 and older with one or more of the following:  - Obesity  - Expected need for hospital services  (PT, OT, Nursing) required for safe  discharge  - Anticipated need for postoperative skilled nursing care or inpatient rehab  - Active co-morbidities: None  Assessment/Plan:  End stage primary arthritis, right knee   The patient history, physical examination, clinical judgment of the provider and imaging studies are consistent with end stage degenerative joint disease of the right knee(s) and total knee arthroplasty is deemed medically necessary. The treatment options including medical management, injection therapy arthroscopy and arthroplasty were discussed at length. The risks and benefits of total knee arthroplasty were presented and reviewed. The risks due to aseptic loosening, infection, stiffness, patella tracking problems, thromboembolic complications and other imponderables were discussed. The patient acknowledged the explanation, agreed to proceed with the plan and consent was signed.  Patient is being admitted for inpatient treatment for surgery, pain control, PT, OT, prophylactic antibiotics, VTE prophylaxis, progressive ambulation and ADL's and discharge planning. The patient is planning to be discharged home with home health services

## 2017-06-15 MED ORDER — SODIUM CHLORIDE 0.9 % IV SOLN
1000.0000 mg | INTRAVENOUS | Status: AC
  Administered 2017-06-16: 1000 mg via INTRAVENOUS
  Filled 2017-06-15: qty 1100

## 2017-06-15 MED ORDER — SODIUM CHLORIDE 0.9 % IV SOLN
2000.0000 mg | INTRAVENOUS | Status: AC
  Administered 2017-06-16: 2000 mg via TOPICAL
  Filled 2017-06-15: qty 20

## 2017-06-16 ENCOUNTER — Encounter (HOSPITAL_COMMUNITY): Payer: Self-pay | Admitting: *Deleted

## 2017-06-16 ENCOUNTER — Inpatient Hospital Stay (HOSPITAL_COMMUNITY): Payer: Medicaid Other | Admitting: Certified Registered"

## 2017-06-16 ENCOUNTER — Inpatient Hospital Stay (HOSPITAL_COMMUNITY): Payer: Medicaid Other | Admitting: Emergency Medicine

## 2017-06-16 ENCOUNTER — Encounter (HOSPITAL_COMMUNITY)
Admission: RE | Disposition: A | Discharge status: Home Health | Payer: Self-pay | Source: Ambulatory Visit | Attending: Orthopaedic Surgery

## 2017-06-16 ENCOUNTER — Inpatient Hospital Stay (HOSPITAL_COMMUNITY)
Admission: RE | Admit: 2017-06-16 | Discharge: 2017-06-18 | DRG: 470 | Disposition: A | Discharge status: Home Health | Payer: Medicaid Other | Source: Ambulatory Visit | Attending: Orthopaedic Surgery | Admitting: Orthopaedic Surgery

## 2017-06-16 DIAGNOSIS — Z8249 Family history of ischemic heart disease and other diseases of the circulatory system: Secondary | ICD-10-CM | POA: Diagnosis not present

## 2017-06-16 DIAGNOSIS — G43909 Migraine, unspecified, not intractable, without status migrainosus: Secondary | ICD-10-CM | POA: Diagnosis present

## 2017-06-16 DIAGNOSIS — F411 Generalized anxiety disorder: Secondary | ICD-10-CM | POA: Diagnosis present

## 2017-06-16 DIAGNOSIS — F909 Attention-deficit hyperactivity disorder, unspecified type: Secondary | ICD-10-CM | POA: Diagnosis present

## 2017-06-16 DIAGNOSIS — K219 Gastro-esophageal reflux disease without esophagitis: Secondary | ICD-10-CM | POA: Diagnosis present

## 2017-06-16 DIAGNOSIS — Z8261 Family history of arthritis: Secondary | ICD-10-CM | POA: Diagnosis not present

## 2017-06-16 DIAGNOSIS — Z823 Family history of stroke: Secondary | ICD-10-CM | POA: Diagnosis not present

## 2017-06-16 DIAGNOSIS — F329 Major depressive disorder, single episode, unspecified: Secondary | ICD-10-CM | POA: Diagnosis present

## 2017-06-16 DIAGNOSIS — E785 Hyperlipidemia, unspecified: Secondary | ICD-10-CM | POA: Diagnosis present

## 2017-06-16 DIAGNOSIS — I10 Essential (primary) hypertension: Secondary | ICD-10-CM | POA: Diagnosis present

## 2017-06-16 DIAGNOSIS — M1711 Unilateral primary osteoarthritis, right knee: Secondary | ICD-10-CM | POA: Diagnosis present

## 2017-06-16 DIAGNOSIS — E669 Obesity, unspecified: Secondary | ICD-10-CM | POA: Diagnosis present

## 2017-06-16 DIAGNOSIS — Z801 Family history of malignant neoplasm of trachea, bronchus and lung: Secondary | ICD-10-CM

## 2017-06-16 HISTORY — DX: Low back pain, unspecified: M54.50

## 2017-06-16 HISTORY — PX: TOTAL KNEE ARTHROPLASTY: SHX125

## 2017-06-16 HISTORY — DX: Other chronic pain: G89.29

## 2017-06-16 HISTORY — DX: Other reaction to spinal and lumbar puncture: G97.1

## 2017-06-16 HISTORY — DX: Low back pain: M54.5

## 2017-06-16 SURGERY — ARTHROPLASTY, KNEE, TOTAL
Anesthesia: Spinal | Laterality: Right

## 2017-06-16 MED ORDER — TRANEXAMIC ACID 1000 MG/10ML IV SOLN
1000.0000 mg | Freq: Once | INTRAVENOUS | Status: AC
  Administered 2017-06-16: 1000 mg via INTRAVENOUS
  Filled 2017-06-16: qty 10

## 2017-06-16 MED ORDER — METHOCARBAMOL 500 MG PO TABS
ORAL_TABLET | ORAL | Status: AC
  Filled 2017-06-16: qty 1

## 2017-06-16 MED ORDER — ONDANSETRON HCL 4 MG/2ML IJ SOLN
INTRAMUSCULAR | Status: AC
  Filled 2017-06-16: qty 2

## 2017-06-16 MED ORDER — QUETIAPINE FUMARATE 25 MG PO TABS
25.0000 mg | ORAL_TABLET | Freq: Every day | ORAL | Status: DC
  Administered 2017-06-16 – 2017-06-17 (×2): 25 mg via ORAL
  Filled 2017-06-16 (×2): qty 1

## 2017-06-16 MED ORDER — PHENYLEPHRINE HCL 10 MG/ML IJ SOLN
INTRAVENOUS | Status: DC | PRN
  Administered 2017-06-16: 50 ug/min via INTRAVENOUS

## 2017-06-16 MED ORDER — KETOROLAC TROMETHAMINE 15 MG/ML IJ SOLN
15.0000 mg | Freq: Four times a day (QID) | INTRAMUSCULAR | Status: AC
  Administered 2017-06-16 – 2017-06-17 (×4): 15 mg via INTRAVENOUS
  Filled 2017-06-16 (×4): qty 1

## 2017-06-16 MED ORDER — BUPIVACAINE IN DEXTROSE 0.75-8.25 % IT SOLN
INTRATHECAL | Status: DC | PRN
  Administered 2017-06-16: 1.6 mL via INTRATHECAL

## 2017-06-16 MED ORDER — DIPHENHYDRAMINE HCL 12.5 MG/5ML PO ELIX: 12.5000 mg | ORAL_SOLUTION | ORAL | Status: DC | PRN

## 2017-06-16 MED ORDER — PROPOFOL 10 MG/ML IV BOLUS
INTRAVENOUS | Status: DC | PRN
  Administered 2017-06-16: 20 mg via INTRAVENOUS
  Administered 2017-06-16: 30 mg via INTRAVENOUS

## 2017-06-16 MED ORDER — OXYCODONE HCL 5 MG PO TABS
ORAL_TABLET | ORAL | Status: AC
  Filled 2017-06-16: qty 2

## 2017-06-16 MED ORDER — DOCUSATE SODIUM 100 MG PO CAPS
100.0000 mg | ORAL_CAPSULE | Freq: Two times a day (BID) | ORAL | Status: DC
  Administered 2017-06-16 – 2017-06-18 (×4): 100 mg via ORAL
  Filled 2017-06-16 (×4): qty 1

## 2017-06-16 MED ORDER — MIDAZOLAM HCL 2 MG/2ML IJ SOLN
INTRAMUSCULAR | Status: AC
  Filled 2017-06-16: qty 2

## 2017-06-16 MED ORDER — BUPROPION HCL ER (XL) 150 MG PO TB24
150.0000 mg | ORAL_TABLET | Freq: Every day | ORAL | Status: DC
  Administered 2017-06-17 – 2017-06-18 (×2): 150 mg via ORAL
  Filled 2017-06-16 (×2): qty 1

## 2017-06-16 MED ORDER — BUPIVACAINE-EPINEPHRINE (PF) 0.5% -1:200000 IJ SOLN
INTRAMUSCULAR | Status: AC
  Filled 2017-06-16: qty 30

## 2017-06-16 MED ORDER — HYDROMORPHONE HCL 1 MG/ML IJ SOLN: 0.5000 mg | INTRAMUSCULAR | Status: DC | PRN

## 2017-06-16 MED ORDER — ONDANSETRON HCL 4 MG/2ML IJ SOLN
INTRAMUSCULAR | Status: DC | PRN
  Administered 2017-06-16: 4 mg via INTRAVENOUS

## 2017-06-16 MED ORDER — MEPERIDINE HCL 50 MG/ML IJ SOLN: 6.2500 mg | INTRAMUSCULAR | Status: DC | PRN

## 2017-06-16 MED ORDER — FENTANYL CITRATE (PF) 250 MCG/5ML IJ SOLN
INTRAMUSCULAR | Status: AC
  Filled 2017-06-16: qty 5

## 2017-06-16 MED ORDER — PRAVASTATIN SODIUM 20 MG PO TABS
20.0000 mg | ORAL_TABLET | Freq: Every day | ORAL | Status: DC
  Administered 2017-06-16 – 2017-06-17 (×2): 20 mg via ORAL
  Filled 2017-06-16 (×2): qty 1

## 2017-06-16 MED ORDER — FENTANYL CITRATE (PF) 100 MCG/2ML IJ SOLN
100.0000 ug | Freq: Once | INTRAMUSCULAR | Status: AC
  Administered 2017-06-16: 100 ug via INTRAVENOUS

## 2017-06-16 MED ORDER — CEFAZOLIN SODIUM-DEXTROSE 2-4 GM/100ML-% IV SOLN
INTRAVENOUS | Status: AC
  Filled 2017-06-16: qty 100

## 2017-06-16 MED ORDER — PROPOFOL 10 MG/ML IV BOLUS
INTRAVENOUS | Status: AC
  Filled 2017-06-16: qty 20

## 2017-06-16 MED ORDER — CLONAZEPAM 1 MG PO TABS
1.0000 mg | ORAL_TABLET | Freq: Every day | ORAL | Status: DC
  Administered 2017-06-16 – 2017-06-17 (×2): 1 mg via ORAL
  Filled 2017-06-16 (×2): qty 1

## 2017-06-16 MED ORDER — ACETAMINOPHEN 325 MG PO TABS
325.0000 mg | ORAL_TABLET | Freq: Four times a day (QID) | ORAL | Status: DC | PRN
  Administered 2017-06-17 – 2017-06-18 (×2): 650 mg via ORAL
  Filled 2017-06-16 (×2): qty 2

## 2017-06-16 MED ORDER — ONDANSETRON HCL 4 MG/2ML IJ SOLN
4.0000 mg | Freq: Four times a day (QID) | INTRAMUSCULAR | Status: DC | PRN
Start: 2017-06-16 — End: 2017-06-18

## 2017-06-16 MED ORDER — FENTANYL CITRATE (PF) 100 MCG/2ML IJ SOLN
INTRAMUSCULAR | Status: AC
  Filled 2017-06-16: qty 2

## 2017-06-16 MED ORDER — CEFAZOLIN SODIUM-DEXTROSE 2-4 GM/100ML-% IV SOLN
2.0000 g | INTRAVENOUS | Status: AC
  Administered 2017-06-16: 2 g via INTRAVENOUS

## 2017-06-16 MED ORDER — LISDEXAMFETAMINE DIMESYLATE 30 MG PO CAPS
60.0000 mg | ORAL_CAPSULE | Freq: Every day | ORAL | Status: DC
  Administered 2017-06-16 – 2017-06-18 (×3): 60 mg via ORAL
  Filled 2017-06-16 (×3): qty 2

## 2017-06-16 MED ORDER — CEFAZOLIN SODIUM-DEXTROSE 2-4 GM/100ML-% IV SOLN
2.0000 g | Freq: Four times a day (QID) | INTRAVENOUS | Status: AC
  Administered 2017-06-16 (×2): 2 g via INTRAVENOUS
  Filled 2017-06-16: qty 100

## 2017-06-16 MED ORDER — METOPROLOL TARTRATE 50 MG PO TABS
50.0000 mg | ORAL_TABLET | Freq: Two times a day (BID) | ORAL | Status: DC
  Administered 2017-06-18: 50 mg via ORAL
  Filled 2017-06-16 (×4): qty 1

## 2017-06-16 MED ORDER — METOCLOPRAMIDE HCL 5 MG/ML IJ SOLN: 5.0000 mg | Freq: Three times a day (TID) | INTRAMUSCULAR | Status: DC | PRN

## 2017-06-16 MED ORDER — MIDAZOLAM HCL 2 MG/2ML IJ SOLN
2.0000 mg | Freq: Once | INTRAMUSCULAR | Status: AC
  Administered 2017-06-16: 2 mg via INTRAVENOUS

## 2017-06-16 MED ORDER — HYDROCHLOROTHIAZIDE 25 MG PO TABS
25.0000 mg | ORAL_TABLET | Freq: Every day | ORAL | Status: DC
  Administered 2017-06-16 – 2017-06-18 (×3): 25 mg via ORAL
  Filled 2017-06-16 (×3): qty 1

## 2017-06-16 MED ORDER — METHOCARBAMOL 500 MG PO TABS
500.0000 mg | ORAL_TABLET | Freq: Four times a day (QID) | ORAL | Status: DC | PRN
  Administered 2017-06-16 – 2017-06-18 (×3): 500 mg via ORAL
  Filled 2017-06-16 (×2): qty 1

## 2017-06-16 MED ORDER — CHLORHEXIDINE GLUCONATE 4 % EX LIQD: 60.0000 mL | Freq: Once | CUTANEOUS | Status: DC

## 2017-06-16 MED ORDER — HYDROMORPHONE HCL 2 MG/ML IJ SOLN: 0.2500 mg | INTRAMUSCULAR | Status: DC | PRN

## 2017-06-16 MED ORDER — OXYCODONE HCL 5 MG PO TABS
10.0000 mg | ORAL_TABLET | ORAL | Status: DC | PRN
  Administered 2017-06-17 (×2): 10 mg via ORAL
  Filled 2017-06-16 (×2): qty 2

## 2017-06-16 MED ORDER — KETOROLAC TROMETHAMINE 30 MG/ML IJ SOLN: 30.0000 mg | Freq: Once | INTRAMUSCULAR | Status: DC | PRN

## 2017-06-16 MED ORDER — PROMETHAZINE HCL 25 MG/ML IJ SOLN: 6.2500 mg | INTRAMUSCULAR | Status: DC | PRN

## 2017-06-16 MED ORDER — PHENOL 1.4 % MT LIQD
1.0000 | OROMUCOSAL | Status: DC | PRN
Start: 2017-06-16 — End: 2017-06-18

## 2017-06-16 MED ORDER — BUPIVACAINE LIPOSOME 1.3 % IJ SUSP
20.0000 mL | INTRAMUSCULAR | Status: DC
  Filled 2017-06-16: qty 20

## 2017-06-16 MED ORDER — ACETAMINOPHEN 500 MG PO TABS
1000.0000 mg | ORAL_TABLET | Freq: Four times a day (QID) | ORAL | Status: AC
  Administered 2017-06-16 – 2017-06-17 (×4): 1000 mg via ORAL
  Filled 2017-06-16 (×4): qty 2

## 2017-06-16 MED ORDER — PANTOPRAZOLE SODIUM 40 MG PO TBEC
40.0000 mg | DELAYED_RELEASE_TABLET | Freq: Every day | ORAL | Status: DC
  Administered 2017-06-17 – 2017-06-18 (×2): 40 mg via ORAL
  Filled 2017-06-16 (×2): qty 1

## 2017-06-16 MED ORDER — OXYCODONE HCL 5 MG PO TABS
5.0000 mg | ORAL_TABLET | ORAL | Status: DC | PRN
  Administered 2017-06-16 – 2017-06-18 (×4): 10 mg via ORAL
  Filled 2017-06-16 (×3): qty 2

## 2017-06-16 MED ORDER — BUPIVACAINE-EPINEPHRINE (PF) 0.5% -1:200000 IJ SOLN
INTRAMUSCULAR | Status: DC | PRN
  Administered 2017-06-16: 30 mL via PERINEURAL

## 2017-06-16 MED ORDER — MENTHOL 3 MG MT LOZG: 1.0000 | LOZENGE | OROMUCOSAL | Status: DC | PRN

## 2017-06-16 MED ORDER — HYDROMORPHONE HCL 2 MG/ML IJ SOLN: 0.5000 mg | INTRAMUSCULAR | Status: DC | PRN

## 2017-06-16 MED ORDER — ALUM & MAG HYDROXIDE-SIMETH 200-200-20 MG/5ML PO SUSP: 30.0000 mL | ORAL | Status: DC | PRN

## 2017-06-16 MED ORDER — ROPIVACAINE HCL 7.5 MG/ML IJ SOLN
INTRAMUSCULAR | Status: DC | PRN
  Administered 2017-06-16 (×6): 5 mL via PERINEURAL

## 2017-06-16 MED ORDER — BISACODYL 5 MG PO TBEC: 5.0000 mg | DELAYED_RELEASE_TABLET | Freq: Every day | ORAL | Status: DC | PRN

## 2017-06-16 MED ORDER — DEXAMETHASONE SODIUM PHOSPHATE 10 MG/ML IJ SOLN
INTRAMUSCULAR | Status: AC
  Filled 2017-06-16: qty 1

## 2017-06-16 MED ORDER — 0.9 % SODIUM CHLORIDE (POUR BTL) OPTIME
TOPICAL | Status: DC | PRN
  Administered 2017-06-16: 1000 mL

## 2017-06-16 MED ORDER — LACTATED RINGERS IV SOLN
INTRAVENOUS | Status: DC
  Administered 2017-06-16 (×2): via INTRAVENOUS

## 2017-06-16 MED ORDER — ONDANSETRON HCL 4 MG PO TABS: 4.0000 mg | ORAL_TABLET | Freq: Four times a day (QID) | ORAL | Status: DC | PRN

## 2017-06-16 MED ORDER — DEXAMETHASONE SODIUM PHOSPHATE 10 MG/ML IJ SOLN
INTRAMUSCULAR | Status: DC | PRN
  Administered 2017-06-16: 10 mg via INTRAVENOUS

## 2017-06-16 MED ORDER — ASPIRIN EC 325 MG PO TBEC
325.0000 mg | DELAYED_RELEASE_TABLET | Freq: Two times a day (BID) | ORAL | Status: DC
  Administered 2017-06-17 – 2017-06-18 (×3): 325 mg via ORAL
  Filled 2017-06-16 (×3): qty 1

## 2017-06-16 MED ORDER — PROPOFOL 500 MG/50ML IV EMUL
INTRAVENOUS | Status: DC | PRN
  Administered 2017-06-16: 100 ug/kg/min via INTRAVENOUS

## 2017-06-16 MED ORDER — ASPIRIN-ACETAMINOPHEN-CAFFEINE 250-250-65 MG PO TABS: 2.0000 | ORAL_TABLET | Freq: Every day | ORAL | Status: DC | PRN

## 2017-06-16 MED ORDER — BUPIVACAINE LIPOSOME 1.3 % IJ SUSP
INTRAMUSCULAR | Status: DC | PRN
  Administered 2017-06-16: 20 mL

## 2017-06-16 MED ORDER — METHOCARBAMOL 1000 MG/10ML IJ SOLN: 500.0000 mg | Freq: Four times a day (QID) | INTRAVENOUS | Status: DC | PRN

## 2017-06-16 MED ORDER — SODIUM CHLORIDE 0.9 % IR SOLN
Status: DC | PRN
  Administered 2017-06-16: 3000 mL

## 2017-06-16 MED ORDER — METOCLOPRAMIDE HCL 5 MG PO TABS: 5.0000 mg | ORAL_TABLET | Freq: Three times a day (TID) | ORAL | Status: DC | PRN

## 2017-06-16 MED ORDER — LACTATED RINGERS IV SOLN: INTRAVENOUS | Status: DC

## 2017-06-16 SURGICAL SUPPLY — 52 items
BAG DECANTER FOR FLEXI CONT (MISCELLANEOUS) IMPLANT
BANDAGE ESMARK 6X9 LF (GAUZE/BANDAGES/DRESSINGS) ×1 IMPLANT
BLADE SAGITTAL 25.0X1.19X90 (BLADE) IMPLANT
BLADE SAW SGTL 13.0X1.19X90.0M (BLADE) IMPLANT
BNDG CMPR 9X6 STRL LF SNTH (GAUZE/BANDAGES/DRESSINGS) ×1
BNDG CMPR MED 10X6 ELC LF (GAUZE/BANDAGES/DRESSINGS) ×1
BNDG ELASTIC 6X10 VLCR STRL LF (GAUZE/BANDAGES/DRESSINGS) ×2 IMPLANT
BNDG ESMARK 6X9 LF (GAUZE/BANDAGES/DRESSINGS) ×2
BOWL SMART MIX CTS (DISPOSABLE) ×2 IMPLANT
CAP KNEE TOTAL 3 SIGMA ×1 IMPLANT
CEMENT HV SMART SET (Cement) ×4 IMPLANT
COVER SURGICAL LIGHT HANDLE (MISCELLANEOUS) ×2 IMPLANT
CUFF TOURNIQUET SINGLE 34IN LL (TOURNIQUET CUFF) ×2 IMPLANT
CUFF TOURNIQUET SINGLE 44IN (TOURNIQUET CUFF) IMPLANT
DECANTER SPIKE VIAL GLASS SM (MISCELLANEOUS) ×2 IMPLANT
DRAPE EXTREMITY T 121X128X90 (DRAPE) ×2 IMPLANT
DRAPE HALF SHEET 40X57 (DRAPES) ×4 IMPLANT
DRAPE U-SHAPE 47X51 STRL (DRAPES) ×2 IMPLANT
DRSG AQUACEL AG ADV 3.5X10 (GAUZE/BANDAGES/DRESSINGS) ×2 IMPLANT
DURAPREP 26ML APPLICATOR (WOUND CARE) ×2 IMPLANT
ELECT REM PT RETURN 9FT ADLT (ELECTROSURGICAL) ×2
ELECTRODE REM PT RTRN 9FT ADLT (ELECTROSURGICAL) ×1 IMPLANT
GLOVE BIO SURGEON STRL SZ8 (GLOVE) ×4 IMPLANT
GLOVE BIOGEL PI IND STRL 8 (GLOVE) ×2 IMPLANT
GLOVE BIOGEL PI INDICATOR 8 (GLOVE) ×2
GOWN STRL REUS W/ TWL LRG LVL3 (GOWN DISPOSABLE) ×1 IMPLANT
GOWN STRL REUS W/ TWL XL LVL3 (GOWN DISPOSABLE) ×2 IMPLANT
GOWN STRL REUS W/TWL LRG LVL3 (GOWN DISPOSABLE) ×2
GOWN STRL REUS W/TWL XL LVL3 (GOWN DISPOSABLE) ×4
HANDPIECE INTERPULSE COAX TIP (DISPOSABLE) ×2
HOOD PEEL AWAY FACE SHEILD DIS (HOOD) ×4 IMPLANT
IMMOBILIZER KNEE 22 UNIV (SOFTGOODS) ×2 IMPLANT
KIT BASIN OR (CUSTOM PROCEDURE TRAY) ×2 IMPLANT
KIT TURNOVER KIT B (KITS) ×2 IMPLANT
MANIFOLD NEPTUNE II (INSTRUMENTS) ×2 IMPLANT
NDL HYPO 21X1 ECLIPSE (NEEDLE) ×1 IMPLANT
NEEDLE HYPO 21X1 ECLIPSE (NEEDLE) ×2 IMPLANT
NS IRRIG 1000ML POUR BTL (IV SOLUTION) ×2 IMPLANT
PACK TOTAL JOINT (CUSTOM PROCEDURE TRAY) ×2 IMPLANT
PAD ARMBOARD 7.5X6 YLW CONV (MISCELLANEOUS) ×4 IMPLANT
SET HNDPC FAN SPRY TIP SCT (DISPOSABLE) ×1 IMPLANT
STRIP CLOSURE SKIN 1/2X4 (GAUZE/BANDAGES/DRESSINGS) ×2 IMPLANT
SUT VIC AB 0 CT1 27 (SUTURE) ×2
SUT VIC AB 0 CT1 27XBRD ANBCTR (SUTURE) ×1 IMPLANT
SUT VIC AB 2-0 CT1 27 (SUTURE) ×2
SUT VIC AB 2-0 CT1 TAPERPNT 27 (SUTURE) ×1 IMPLANT
SUT VIC AB 3-0 FS2 27 (SUTURE) ×2 IMPLANT
SUT VLOC 180 0 24IN GS25 (SUTURE) ×2 IMPLANT
SYR 50ML LL SCALE MARK (SYRINGE) ×2 IMPLANT
TOWEL OR 17X24 6PK STRL BLUE (TOWEL DISPOSABLE) ×2 IMPLANT
TOWEL OR 17X26 10 PK STRL BLUE (TOWEL DISPOSABLE) ×2 IMPLANT
TRAY CATH 16FR W/PLASTIC CATH (SET/KITS/TRAYS/PACK) IMPLANT

## 2017-06-16 NOTE — Interval H&P Note (Signed)
History and Physical Interval Note:  06/16/2017 9:16 AM  Shelly Brown  has presented today for surgery, with the diagnosis of RIGHT KNEE DEGENERATIVE JOINT DISEASE  The various methods of treatment have been discussed with the patient and family. After consideration of risks, benefits and other options for treatment, the patient has consented to  Procedure(s): TOTAL KNEE ARTHROPLASTY (Right) as a surgical intervention .  The patient's history has been reviewed, patient examined, no change in status, stable for surgery.  I have reviewed the patient's chart and labs.  Questions were answered to the patient's satisfaction.     [name]

## 2017-06-16 NOTE — Progress Notes (Signed)
Patient received a bed 5N13, family aware of room. Report called to 5N Roj, RN.  Hermina Barters, RN

## 2017-06-16 NOTE — Anesthesia Procedure Notes (Signed)
Spinal  Patient location during procedure: OR Start time: 06/16/2017 10:18 AM End time: 06/16/2017 10:22 AM Staffing Anesthesiologist: Leilani Able, MD Performed: anesthesiologist  Preanesthetic Checklist Completed: patient identified, site marked, surgical consent, pre-op evaluation, timeout performed, IV checked, risks and benefits discussed and monitors and equipment checked Spinal Block Patient position: sitting Prep: site prepped and draped and DuraPrep Patient monitoring: continuous pulse ox and blood pressure Approach: midline Location: L3-4 Injection technique: single-shot Needle Needle type: Pencan  Needle gauge: 24 G Needle length: 10 cm Needle insertion depth: 5 cm Assessment Sensory level: T6

## 2017-06-16 NOTE — Op Note (Signed)
PREOP DIAGNOSIS: DJD RIGHT KNEE POSTOP DIAGNOSIS: same PROCEDURE: RIGHT TKR ANESTHESIA: Spinal and MAC ATTENDING SURGEON: Arlyn Bumpus G ASSISTANT: Loni Dolly PA  INDICATIONS FOR PROCEDURE: Shelly Brown is a 52 y.o. female who has struggled for a long time with pain due to degenerative arthritis of the right knee.  The patient has failed many conservative non-operative measures and at this point has pain which limits the ability to sleep and walk.  The patient is offered total knee replacement.  Informed operative consent was obtained after discussion of possible risks of anesthesia, infection, neurovascular injury, DVT, and death.  The importance of the post-operative rehabilitation protocol to optimize result was stressed extensively with the patient.  SUMMARY OF FINDINGS AND PROCEDURE:  Shelly Brown was taken to the operative suite where under the above anesthesia a right knee replacement was performed.  There were advanced degenerative changes and the bone quality was good.  We used the DePuy LCS system and placed size standard femur, 4 tibia, 35 mm all polyethylene patella, and a size 12.5 mm spacer.  Loni Dolly PA-C assisted throughout and was invaluable to the completion of the case in that he helped retract and maintain exposure while I placed components.  He also helped close thereby minimizing OR time.  The patient was admitted for appropriate post-op care to include perioperative antibiotics and mechanical and pharmacologic measures for DVT prophylaxis.  DESCRIPTION OF PROCEDURE:  Shelly Brown was taken to the operative suite where the above anesthesia was applied.  The patient was positioned supine and prepped and draped in normal sterile fashion.  An appropriate time out was performed.  After the administration of kefzol pre-op antibiotic the leg was elevated and exsanguinated and a tourniquet inflated. A standard longitudinal incision was made on the anterior knee.  Dissection was  carried down to the extensor mechanism.  All appropriate anti-infective measures were used including the pre-operative antibiotic, betadine impregnated drape, and closed hooded exhaust systems for each member of the surgical team.  A medial parapatellar incision was made in the extensor mechanism and the knee cap flipped and the knee flexed.  Some residual meniscal tissues were removed along with any remaining ACL/PCL tissue.  A guide was placed on the tibia and a flat cut was made on it's superior surface.  An intramedullary guide was placed in the femur and was utilized to make anterior and posterior cuts creating an appropriate flexion gap.  A second intramedullary guide was placed in the femur to make a distal cut properly balancing the knee with an extension gap equal to the flexion gap.  The three bones sized to the above mentioned sizes and the appropriate guides were placed and utilized.  A trial reduction was done and the knee easily came to full extension and the patella tracked well on flexion.  The trial components were removed and all bones were cleaned with pulsatile lavage and then dried thoroughly.  Cement was mixed and was pressurized onto the bones followed by placement of the aforementioned components.  Excess cement was trimmed and pressure was held on the components until the cement had hardened.  The tourniquet was deflated and a small amount of bleeding was controlled with cautery and pressure.  The knee was irrigated thoroughly.  The extensor mechanism was re-approximated with V-loc suture in running fashion.  The knee was flexed and the repair was solid.  The subcutaneous tissues were re-approximated with #0 and #2-0 vicryl and the skin closed with a subcuticular stitch and  steristrips.  A sterile dressing was applied.  Intraoperative fluids, EBL, and tourniquet time can be obtained from anesthesia records.  DISPOSITION:  The patient was taken to recovery room in stable condition and  admitted for appropriate post-op care to include peri-operative antibiotic and DVT prophylaxis with mechanical and pharmacologic measures.  Orly Quimby G 06/16/2017, 11:45 AM

## 2017-06-16 NOTE — Anesthesia Preprocedure Evaluation (Signed)
Anesthesia Evaluation  Patient identified by MRN, date of birth, ID band Patient awake    Reviewed: Allergy & Precautions, NPO status , Patient's Chart, lab work & pertinent test results  Airway Mallampati: I       Dental no notable dental hx. (+) Teeth Intact   Pulmonary    Pulmonary exam normal breath sounds clear to auscultation       Cardiovascular hypertension, Pt. on medications and Pt. on home beta blockers Normal cardiovascular exam Rhythm:Regular Rate:Normal     Neuro/Psych PSYCHIATRIC DISORDERS Anxiety Depression    GI/Hepatic Neg liver ROS, GERD  Medicated,  Endo/Other  negative endocrine ROS  Renal/GU negative Renal ROS  negative genitourinary   Musculoskeletal   Abdominal (+) + obese,   Peds  Hematology   Anesthesia Other Findings   Reproductive/Obstetrics                             Anesthesia Physical Anesthesia Plan  ASA: II  Anesthesia Plan: Spinal   Post-op Pain Management:  Regional for Post-op pain   Induction:   PONV Risk Score and Plan: 2 and Ondansetron and Dexamethasone  Airway Management Planned: Natural Airway, Nasal Cannula and Simple Face Mask  Additional Equipment:   Intra-op Plan:   Post-operative Plan:   Informed Consent: I have reviewed the patients History and Physical, chart, labs and discussed the procedure including the risks, benefits and alternatives for the proposed anesthesia with the patient or authorized representative who has indicated his/her understanding and acceptance.     Plan Discussed with: CRNA and Surgeon  Anesthesia Plan Comments:         Anesthesia Quick Evaluation

## 2017-06-16 NOTE — Progress Notes (Signed)
Urine preg test complete neg

## 2017-06-16 NOTE — Anesthesia Postprocedure Evaluation (Signed)
Anesthesia Post Note  Patient: Shelly Brown  Procedure(s) Performed: TOTAL KNEE ARTHROPLASTY (Right )     Patient location during evaluation: PACU Anesthesia Type: Spinal Level of consciousness: awake Pain management: pain level controlled Vital Signs Assessment: post-procedure vital signs reviewed and stable Respiratory status: spontaneous breathing Cardiovascular status: stable Postop Assessment: no headache, no backache, spinal receding, patient able to bend at knees and no apparent nausea or vomiting Anesthetic complications: no    Last Vitals:  Vitals:   06/16/17 1335 06/16/17 1345  BP: 98/69   Pulse: 78 81  Resp: 17 (!) 21  Temp:    SpO2: 93% 92%    Last Pain:  Vitals:   06/16/17 1330  TempSrc:   PainSc: 0-No pain   Pain Goal: Patients Stated Pain Goal: 2 (06/16/17 0824)               Analuisa Tudor JR,JOHN Susann Givens

## 2017-06-16 NOTE — Anesthesia Procedure Notes (Signed)
Procedure Name: MAC Date/Time: 06/16/2017 10:18 AM Performed by: Barrington Ellison, CRNA Pre-anesthesia Checklist: Patient identified, Emergency Drugs available, Suction available, Patient being monitored and Timeout performed Patient Re-evaluated:Patient Re-evaluated prior to induction Oxygen Delivery Method: Simple face mask

## 2017-06-16 NOTE — Anesthesia Procedure Notes (Signed)
Anesthesia Regional Block: Adductor canal block   Pre-Anesthetic Checklist: ,, timeout performed, Correct Patient, Correct Site, Correct Laterality, Correct Procedure, Correct Position, site marked, Risks and benefits discussed,  Surgical consent,  Pre-op evaluation,  At surgeon's request and post-op pain management  Laterality: Right and Lower  Prep: chloraprep       Needles:  Injection technique: Single-shot  Needle Type: Echogenic Stimulator Needle     Needle Length: 10cm  Needle Gauge: 21   Needle insertion depth: 3 cm   Additional Needles:   Narrative:  Start time: 06/16/2017 9:22 AM End time: 06/16/2017 9:32 AM Injection made incrementally with aspirations every 5 mL.  Performed by: Personally  Anesthesiologist: Leilani Able, MD

## 2017-06-16 NOTE — Plan of Care (Signed)
  Problem: Nutrition: Goal: Adequate nutrition will be maintained Outcome: Progressing   Problem: Elimination: Goal: Will not experience complications related to bowel motility Outcome: Progressing   Problem: Pain Managment: Goal: General experience of comfort will improve Outcome: Progressing   Problem: Safety: Goal: Ability to remain free from injury will improve Outcome: Progressing   

## 2017-06-16 NOTE — Transfer of Care (Signed)
Immediate Anesthesia Transfer of Care Note  Patient: Shelly Brown  Procedure(s) Performed: TOTAL KNEE ARTHROPLASTY (Right )  Patient Location: PACU  Anesthesia Type:MAC and Spinal  Level of Consciousness: awake and oriented  Airway & Oxygen Therapy: Patient Spontanous Breathing  Post-op Assessment: Report given to RN  Post vital signs: Reviewed and stable  Last Vitals:  Vitals Value Taken Time  BP    Temp    Pulse    Resp    SpO2      Last Pain:  Vitals:   06/16/17 0935  TempSrc:   PainSc: 0-No pain      Patients Stated Pain Goal: 2 (09/40/76 8088)  Complications: No apparent anesthesia complications

## 2017-06-16 NOTE — Plan of Care (Signed)
  Problem: Coping: Goal: Level of anxiety will decrease Outcome: Progressing   Problem: Pain Managment: Goal: General experience of comfort will improve Outcome: Progressing   

## 2017-06-17 ENCOUNTER — Other Ambulatory Visit: Payer: Self-pay

## 2017-06-17 ENCOUNTER — Encounter (HOSPITAL_COMMUNITY): Payer: Self-pay | Admitting: Orthopaedic Surgery

## 2017-06-17 NOTE — Progress Notes (Signed)
Subjective: 1 Day Post-Op Procedure(s) (LRB): TOTAL KNEE ARTHROPLASTY (Right)  Activity level:  wbat Diet tolerance:  ok Voiding:  ok Patient reports pain as mild.    Objective: Vital signs in last 24 hours: Temp:  [97.2 F (36.2 C)-98.6 F (37 C)] 97.5 F (36.4 C) (05/08 0520) Pulse Rate:  [75-100] 75 (05/08 0520) Resp:  [11-24] 16 (05/07 1614) BP: (89-129)/(58-87) 105/71 (05/08 0520) SpO2:  [91 %-98 %] 95 % (05/08 0520) Weight:  [93.9 kg (207 lb)] 93.9 kg (207 lb) (05/07 0824)  Labs: No results for input(s): HGB in the last 72 hours. No results for input(s): WBC, RBC, HCT, PLT in the last 72 hours. No results for input(s): NA, K, CL, CO2, BUN, CREATININE, GLUCOSE, CALCIUM in the last 72 hours. No results for input(s): LABPT, INR in the last 72 hours.  Physical Exam:  Neurologically intact ABD soft Neurovascular intact Sensation intact distally Intact pulses distally Dorsiflexion/Plantar flexion intact Incision: dressing C/D/I and no drainage No cellulitis present Compartment soft  Assessment/Plan:  1 Day Post-Op Procedure(s) (LRB): TOTAL KNEE ARTHROPLASTY (Right) Advance diet Up with therapy Plan for discharge tomorrow Discharge home with home health if doing well and cleared by PT. Continue on ASA  BID x 2 weeks post op. Follow up in office 2 weeks post op. Anticipated LOS equal to or greater than 2 midnights due to - Age 50 and older with one or more of the following:  - Obesity  - Expected need for hospital services (PT, OT, Nursing) required for safe  discharge  - Anticipated need for postoperative skilled nursing care or inpatient rehab  - Active co-morbidities: None OR   - Unanticipated findings during/Post Surgery: Slow post-op progression: GI, pain control, mobility  - Patient is a high risk of re-admission due to: None   Devlon Dosher, Ginger Organ 06/17/2017, 7:43 AM

## 2017-06-17 NOTE — Evaluation (Signed)
Physical Therapy Evaluation Patient Details Name: Shelly Brown MRN: [id number] DOB: 1965/06/12 Today's Date: 06/17/2017   History of Present Illness  Pt. is a 52 y.o. F with significant PMH of hyperlipidemia, hypertension, and cervical fusion surgery now s/p right total knee arthroplasty.  Clinical Impression  Pt is s/p TKA resulting in the deficits listed below (see PT Problem List). Patient independent prior to admission, recently graduated with a criminal justice degree, and lives with her 31 year old daughter. Patient lives in an apartment with 20 steps to enter, but states that she can live with her sister temporarily following discharge. Upon PT evaluation, patient presenting with decreased functional mobility secondary to right knee pain, gait deviations, and decreased strength/range of motion. Ambulated 140 feet with rolling walker utilizing a step to, antalgic gait pattern. Based on current assessment, recommending patient discharge home with her sister due to inaccessible home environment and increased caregiver support. Patient will benefit from HHPT to address deficits. Pt will benefit from skilled PT to increase their independence and safety with mobility to allow discharge to the venue listed below.      Follow Up Recommendations Follow surgeon's recommendation for DC plan and follow-up therapies    Equipment Recommendations  Rolling walker with 5" wheels;3in1 (PT)    Recommendations for Other Services       Precautions / Restrictions Precautions Precautions: Knee Precaution Booklet Issued: Yes (comment) Precaution Comments: Reviewed no pillow underneath knee. Placed foam underneath ankle while seated to promote knee extension Required Braces or Orthoses: Knee Immobilizer - Right Restrictions Weight Bearing Restrictions: Yes RLE Weight Bearing: Weight bearing as tolerated      Mobility  Bed Mobility Overal bed mobility: Modified Independent                 Transfers Overall transfer level: Needs assistance Equipment used: Rolling walker (2 wheeled) Transfers: Sit to/from Stand Sit to Stand: Supervision         General transfer comment: VC's for hand placement and kicking right foot out prior to sitting/standing for pain control  Ambulation/Gait Ambulation/Gait assistance: Min guard Ambulation Distance (Feet): 140 Feet Assistive device: Rolling walker (2 wheeled) Gait Pattern/deviations: Step-to pattern;Decreased step length - left;Decreased weight shift to right   Gait velocity interpretation: <1.31 ft/sec, indicative of household ambulator General Gait Details: Verbal and tactile cueing for weight shifting to right, increased left step length, "softer" left step with heel strike at initial contact, RW proximity, and shorter right step length. Patient with very slow, halting, guarded gait.  Stairs            Wheelchair Mobility    Modified Rankin (Stroke Patients Only)       Balance Overall balance assessment: Mild deficits observed, not formally tested                                           Pertinent Vitals/Pain Pain Assessment: 0-10 Pain Score: 6 (4 prior to ambulation, 6 after ambulation) Pain Descriptors / Indicators: Burning;Guarding Pain Intervention(s): Limited activity within patient's tolerance;Monitored during session;Patient requesting pain meds-RN notified;Ice applied    Home Living Family/patient expects to be discharged to:: Private residence Living Arrangements: Children Available Help at Discharge: Family(Sister) Type of Home: Apartment Home Access: Stairs to enter Entrance Stairs-Rails: Can reach both Entrance Stairs-Number of Steps: 20 Home Layout: One level Home Equipment: None Additional Comments: Lives with 16  year old daughter. Able to live with sister if needed    Prior Function Level of Independence: Independent         Comments: Unemployed; just finished up  college degree in criminal justice.     Hand Dominance        Extremity/Trunk Assessment   Upper Extremity Assessment Upper Extremity Assessment: Overall WFL for tasks assessed    Lower Extremity Assessment Lower Extremity Assessment: RLE deficits/detail;LLE deficits/detail RLE Deficits / Details: expected post op deficits LLE Deficits / Details: WFL    Cervical / Trunk Assessment Cervical / Trunk Assessment: Normal  Communication   Communication: No difficulties  Cognition Arousal/Alertness: Awake/alert Behavior During Therapy: WFL for tasks assessed/performed Overall Cognitive Status: Within Functional Limits for tasks assessed                                        General Comments General comments (skin integrity, edema, etc.): Written HEP given.    Exercises General Exercises - Lower Extremity Ankle Circles/Pumps: 20 reps;Both;Seated Quad Sets: 20 reps;Seated;Right Long Arc Quad: 15 reps;Right;Seated   Assessment/Plan    PT Assessment Patient needs continued PT services  PT Problem List Decreased strength;Decreased range of motion;Decreased activity tolerance;Decreased balance;Decreased mobility;Pain       PT Treatment Interventions DME instruction;Gait training;Stair training;Functional mobility training;Therapeutic activities;Therapeutic exercise;Balance training;Patient/family education    PT Goals (Current goals can be found in the Care Plan section)  Acute Rehab PT Goals Patient Stated Goal: Obtain a job in criminal justice PT Goal Formulation: With patient Time For Goal Achievement: 06/22/17 Potential to Achieve Goals: Good    Frequency 7X/week   Barriers to discharge        Co-evaluation               AM-PAC PT "6 Clicks" Daily Activity  Outcome Measure Difficulty turning over in bed (including adjusting bedclothes, sheets and blankets)?: None Difficulty moving from lying on back to sitting on the side of the bed? : A  Little Difficulty sitting down on and standing up from a chair with arms (e.g., wheelchair, bedside commode, etc,.)?: A Little Help needed moving to and from a bed to chair (including a wheelchair)?: A Little Help needed walking in hospital room?: A Little Help needed climbing 3-5 steps with a railing? : A Little 6 Click Score: 19    End of Session Equipment Utilized During Treatment: Gait belt Activity Tolerance: Patient tolerated treatment well Patient left: in chair;with call bell/phone within reach Nurse Communication: Patient requests pain meds PT Visit Diagnosis: Pain;Difficulty in walking, not elsewhere classified (R26.2) Pain - Right/Left: Right Pain - part of body: Knee    Time: 0800-0833 PT Time Calculation (min) (ACUTE ONLY): 33 min   Charges:   PT Evaluation $PT Eval Low Complexity: 1 Low PT Treatments $Gait Training: 8-22 mins   PT G Codes:        Laurina Bustle, PT, DPT Acute Rehabilitation Services  Pager: [phone]   Vanetta Mulders 06/17/2017, 9:31 AM

## 2017-06-17 NOTE — Care Management Note (Signed)
Case Management Note  Patient Details  Name: Shelly Brown MRN: [id number] Date of Birth: March 31, 1965  Subjective/Objective:  52 yr old female s/p right total knee arthroplasty.                  Action/Plan: Case manager spoke with patient and family concerning discharge plan and DME. Patient has been setup with [facility], no changes. She will be going to her sister Shelly Brown's home for recovery: [address], [place], [place]  [address]. Patient's cell:[phone].    Expected Discharge Date:    06/18/17              Expected Discharge Plan:  Home w Home Health Services  In-House Referral:  NA  Discharge planning Services  CM Consult  Post Acute Care Choice:  Durable Medical Equipment, Home Health Choice offered to:  Patient  DME Arranged:  3-N-1, Walker rolling DME Agency:  [employer]  HH Arranged:  PT HH Agency:  [facility]  Status of Service:  Completed, signed off  If discussed at [employer] [facility] [employer], dates discussed:    Additional Comments:  Durenda Guthrie, RN 06/17/2017, 3:35 PM

## 2017-06-17 NOTE — Progress Notes (Signed)
Physical Therapy Treatment Patient Details Name: Shelly Brown MRN: [id number] DOB: 04-14-1965 Today's Date: 06/17/2017    History of Present Illness Pt. is a 52 y.o. F with significant PMH of hyperlipidemia, hypertension, and cervical fusion surgery now s/p right total knee arthroplasty.    PT Comments    Patient with marked improvement during this afternoon session. Demonstrates increased gait speed and able to progress to step through pattern. Still utilizing moderate BUE support on RW. Range of motion (seated): 0 deg extension - 100 deg flexion.  Educated/instructed on seated and supine heel slides to increase knee flexion. Patient decided to discharge to sister's home which has a level entry. No longer needs to practice steps prior to discharge.   Follow Up Recommendations  Follow surgeon's recommendation for DC plan and follow-up therapies     Equipment Recommendations  Rolling walker with 5" wheels;3in1 (PT)    Recommendations for Other Services       Precautions / Restrictions Precautions Precautions: Knee Precaution Booklet Issued: Yes (comment) Precaution Comments: Reviewed no pillow underneath knee. Placed foam underneath ankle while seated to promote knee extension Required Braces or Orthoses: Knee Immobilizer - Right Restrictions Weight Bearing Restrictions: Yes RLE Weight Bearing: Weight bearing as tolerated    Mobility  Bed Mobility Overal bed mobility: Modified Independent                Transfers Overall transfer level: Needs assistance Equipment used: Rolling walker (2 wheeled) Transfers: Sit to/from Stand Sit to Stand: Modified independent (Device/Increase time)            Ambulation/Gait Ambulation/Gait assistance: Supervision Ambulation Distance (Feet): 140 Feet Assistive device: Rolling walker (2 wheeled) Gait Pattern/deviations: Decreased step length - left;Decreased weight shift to right;Step-through pattern     General Gait Details:  VC's for step through pattern, decreased right foot supination, and decreased right step length. Patient able to perform.   Stairs             Wheelchair Mobility    Modified Rankin (Stroke Patients Only)       Balance Overall balance assessment: Mild deficits observed, not formally tested                                          Cognition Arousal/Alertness: Awake/alert Behavior During Therapy: WFL for tasks assessed/performed Overall Cognitive Status: Within Functional Limits for tasks assessed                                        Exercises Total Joint Exercises Towel Squeeze: 10 reps;Seated Heel Slides: 10 reps;Right;AROM;Seated Goniometric ROM: Seated: 0 degrees extension - 100 degrees flexion    General Comments        Pertinent Vitals/Pain Pain Assessment: Faces Faces Pain Scale: Hurts little more Pain Descriptors / Indicators: Guarding Pain Intervention(s): Monitored during session    Home Living                      Prior Function            PT Goals (current goals can now be found in the care plan section) Acute Rehab PT Goals Patient Stated Goal: Walk across stage for diploma June 22 PT Goal Formulation: With patient Time For Goal Achievement: 06/22/17 Potential to Achieve  Goals: Good Progress towards PT goals: Progressing toward goals    Frequency    7X/week      PT Plan Current plan remains appropriate    Co-evaluation              AM-PAC PT "6 Clicks" Daily Activity  Outcome Measure  Difficulty turning over in bed (including adjusting bedclothes, sheets and blankets)?: None Difficulty moving from lying on back to sitting on the side of the bed? : A Little Difficulty sitting down on and standing up from a chair with arms (e.g., wheelchair, bedside commode, etc,.)?: A Little Help needed moving to and from a bed to chair (including a wheelchair)?: A Little Help needed walking in  hospital room?: A Little Help needed climbing 3-5 steps with a railing? : A Little 6 Click Score: 19    End of Session   Activity Tolerance: Patient tolerated treatment well Patient left: in chair;with call bell/phone within reach Nurse Communication: Mobility status PT Visit Diagnosis: Pain;Difficulty in walking, not elsewhere classified (R26.2) Pain - Right/Left: Right Pain - part of body: Knee     Time: 0272-5366 PT Time Calculation (min) (ACUTE ONLY): 21 min  Charges:  $Gait Training: 8-22 mins                    G Codes:       Laurina Bustle, PT, DPT Acute Rehabilitation Services  Pager: [phone]   Vanetta Mulders 06/17/2017, 4:41 PM

## 2017-06-18 MED ORDER — DOCUSATE SODIUM 100 MG PO CAPS: 100.0000 mg | ORAL_CAPSULE | Freq: Two times a day (BID) | ORAL | 0 refills | Status: DC

## 2017-06-18 MED ORDER — BISACODYL 5 MG PO TBEC: 5.0000 mg | DELAYED_RELEASE_TABLET | Freq: Every day | ORAL | 0 refills | Status: DC | PRN

## 2017-06-18 MED ORDER — OXYCODONE-ACETAMINOPHEN 5-325 MG PO TABS: 1.0000 | ORAL_TABLET | ORAL | 0 refills | Status: AC | PRN

## 2017-06-18 MED ORDER — TIZANIDINE HCL 4 MG PO TABS: 4.0000 mg | ORAL_TABLET | Freq: Four times a day (QID) | ORAL | 1 refills | Status: AC | PRN

## 2017-06-18 MED ORDER — ASPIRIN 325 MG PO TBEC: 325.0000 mg | DELAYED_RELEASE_TABLET | Freq: Two times a day (BID) | ORAL | 0 refills | Status: DC

## 2017-06-18 NOTE — Progress Notes (Signed)
Subjective: 2 Days Post-Op Procedure(s) (LRB): TOTAL KNEE ARTHROPLASTY (Right)   Patient is in a little bit more pain this morning. She still wishes to go home if all goes well with PT today.  Activity level:  wbat Diet tolerance:  ok Voiding:  ok Patient reports pain as mild and moderate.    Objective: Vital signs in last 24 hours: Temp:  [97.7 F (36.5 C)] 97.7 F (36.5 C) (05/08 2213) Pulse Rate:  [82-90] 90 (05/08 2213) Resp:  [16] 16 (05/08 2213) BP: (127-131)/(72-85) 131/85 (05/08 2213) SpO2:  [95 %-96 %] 96 % (05/08 2213)  Labs: No results for input(s): HGB in the last 72 hours. No results for input(s): WBC, RBC, HCT, PLT in the last 72 hours. No results for input(s): NA, K, CL, CO2, BUN, CREATININE, GLUCOSE, CALCIUM in the last 72 hours. No results for input(s): LABPT, INR in the last 72 hours.  Physical Exam:  Neurologically intact ABD soft Neurovascular intact Sensation intact distally Intact pulses distally Dorsiflexion/Plantar flexion intact Incision: dressing C/D/I and no drainage No cellulitis present Compartment soft  Assessment/Plan:  2 Days Post-Op Procedure(s) (LRB): TOTAL KNEE ARTHROPLASTY (Right) Advance diet Up with therapy Discharge home with home health Today if doingwell and cleared by PT. Continue on ASA  BID x 2 weeks post op. Follow up in office 2 weeks post op. Anticipated LOS equal to or greater than 2 midnights due to - Age 50 and older with one or more of the following:  - Obesity  - Expected need for hospital services (PT, OT, Nursing) required for safe  discharge  - Anticipated need for postoperative skilled nursing care or inpatient rehab  - Active co-morbidities: None OR   - Unanticipated findings during/Post Surgery: Slow post-op progression: GI, pain control, mobility  - Patient is a high risk of re-admission due to: None  Aerica Rincon, Ginger Organ 06/18/2017, 6:44 AM

## 2017-06-18 NOTE — Discharge Summary (Signed)
Patient ID: Shelly Brown MRN: [id number] DOB/AGE: Nov 22, 1965 52 y.o.  Admit date: 06/16/2017 Discharge date: 06/18/2017  Admission Diagnoses:  Principal Problem:   Primary osteoarthritis of right knee   Discharge Diagnoses:  Same  Past Medical History:  Diagnosis Date  . ADHD (attention deficit hyperactivity disorder)   . Anxiety   . Anxiety and depression    mainly anxiety, Dr. Westley Chandler  . Anxiety state, unspecified 08/16/2013  . Arthritis    "right knee" (06/17/2017)  . Cholelithiasis 05/2013  . Chronic lower back pain   . Depression   . GERD (gastroesophageal reflux disease)    chronic gastritis on EGD 12/2012  . History of chicken pox   . Hyperlipidemia   . Hypertension   . Insomnia   . Migraine    "stopped after neck OR in 2012" (06/17/2017)  . OSA (obstructive sleep apnea)    "can't tolerate CPAP" (06/17/2017)  . Seasonal allergies   . Spinal headache    "related to epidural for c-section & after myelogram" (06/17/2017)    Surgeries: Procedure(s): TOTAL KNEE ARTHROPLASTY on 06/16/2017   Consultants:   Discharged Condition: Improved  Hospital Course: Shelly Brown is an 52 y.o. female who was admitted 06/16/2017 for operative treatment ofPrimary osteoarthritis of right knee. Patient has severe unremitting pain that affects sleep, daily activities, and work/hobbies. After pre-op clearance the patient was taken to the operating room on 06/16/2017 and underwent  Procedure(s): TOTAL KNEE ARTHROPLASTY.    Patient was given perioperative antibiotics:  Anti-infectives (From admission, onward)   Start     Dose/Rate Route Frequency Ordered Stop   06/16/17 1530  ceFAZolin (ANCEF) IVPB 2g/100 mL premix     2 g 200 mL/hr over 30 Minutes Intravenous Every 6 hours 06/16/17 1423 06/16/17 2113   06/16/17 1425  ceFAZolin (ANCEF) 2-4 GM/100ML-% IVPB    Note to Pharmacy:  Lynetta Mare   : cabinet override      06/16/17 1425 06/17/17 0244   06/16/17 0900  ceFAZolin (ANCEF) IVPB 2g/100 mL premix      2 g 200 mL/hr over 30 Minutes Intravenous To [facility] 06/16/17 0816 06/16/17 1023   06/16/17 0821  ceFAZolin (ANCEF) 2-4 GM/100ML-% IVPB    Note to Pharmacy:  Gillian Shields   : cabinet override      06/16/17 0821 06/16/17 1023       Patient was given sequential compression devices, early ambulation, and chemoprophylaxis to prevent DVT.  Patient benefited maximally from hospital stay and there were no complications.    Recent vital signs:  Patient Vitals for the past 24 hrs:  BP Temp Temp src Pulse Resp SpO2  06/17/17 2213 131/85 97.7 F (36.5 C) Oral 90 16 96 %  06/17/17 1300 127/72 97.7 F (36.5 C) Oral 82 16 95 %     Recent laboratory studies: No results for input(s): WBC, HGB, HCT, PLT, NA, K, CL, CO2, BUN, CREATININE, GLUCOSE, INR, CALCIUM in the last 72 hours.  Invalid input(s): PT, 2   Discharge Medications:   Allergies as of 06/18/2017      Reactions   Morphine And Related Other (See Comments)   Hallucinations-       Medication List    STOP taking these medications   HYDROcodone-acetaminophen 5-325 MG tablet Commonly known as:  NORCO/VICODIN   naproxen 500 MG tablet Commonly known as:  NAPROSYN     TAKE these medications   aspirin 325 MG EC tablet Take 1 tablet (325 mg total) by mouth  2 (two) times daily after a meal.   aspirin-acetaminophen-caffeine 250-250-65 MG tablet Commonly known as:  EXCEDRIN MIGRAINE Take 2 tablets by mouth daily as needed for migraine.   bisacodyl 5 MG EC tablet Commonly known as:  DULCOLAX Take 1 tablet (5 mg total) by mouth daily as needed for moderate constipation.   buPROPion 150 MG 24 hr tablet Commonly known as:  WELLBUTRIN XL Take 150 mg by mouth daily.   clonazePAM 1 MG tablet Commonly known as:  KLONOPIN Take 1 mg by mouth at bedtime.   docusate sodium 100 MG capsule Commonly known as:  COLACE Take 1 capsule (100 mg total) by mouth 2 (two) times daily.   GAS-X PO Take 2 tablets by mouth daily  as needed (gas).   hydrochlorothiazide 25 MG tablet Commonly known as:  HYDRODIURIL Take 25 mg by mouth daily.   lidocaine 5 % Commonly known as:  LIDODERM Place 1 patch onto the skin daily. Remove & Discard patch within 12 hours or as directed by MD   lisdexamfetamine 60 MG capsule Commonly known as:  VYVANSE Take 60 mg by mouth every morning.   metoprolol tartrate 50 MG tablet Commonly known as:  LOPRESSOR Take 50 mg by mouth 2 (two) times daily.   omeprazole 20 MG capsule Commonly known as:  PRILOSEC Take 20 mg by mouth daily.   oxyCODONE-acetaminophen 5-325 MG tablet Commonly known as:  PERCOCET Take 1-2 tablets by mouth every 4 (four) hours as needed for severe pain.   pravastatin 20 MG tablet Commonly known as:  PRAVACHOL Take 20 mg by mouth at bedtime.   QUEtiapine 25 MG tablet Commonly known as:  SEROQUEL Take 25 mg by mouth at bedtime.   tiZANidine 4 MG tablet Commonly known as:  ZANAFLEX Take 1 tablet (4 mg total) by mouth every 6 (six) hours as needed.            Durable Medical Equipment  (From admission, onward)        Start     Ordered   06/16/17 1612  DME Walker rolling  Once    Question:  Patient needs a walker to treat with the following condition  Answer:  Primary osteoarthritis of right knee   06/16/17 1611   06/16/17 1612  DME 3 n 1  Once     06/16/17 1611   06/16/17 1612  DME Bedside commode  Once    Question:  Patient needs a bedside commode to treat with the following condition  Answer:  Primary osteoarthritis of right knee   06/16/17 1611      Diagnostic Studies: Dg Chest 2 View  Result Date: 06/04/2017 CLINICAL DATA:  Preop knee replacement EXAM: CHEST - 2 VIEW COMPARISON:  10/07/2015, 10/14/2015 FINDINGS: Postsurgical changes in the cervical spine. No acute opacity or effusion. Normal heart size. No pneumothorax. Surgical clips in the right upper quadrant. IMPRESSION: No active cardiopulmonary disease. Electronically Signed   By:  Jasmine Pang M.D.   On: 06/04/2017 16:18    Disposition: Discharge disposition: 01-Home or Self Care       Discharge Instructions    Call MD / Call 911   Complete by:  As directed    If you experience chest pain or shortness of breath, CALL 911 and be transported to the hospital emergency room.  If you develope a fever above 101 F, pus (white drainage) or increased drainage or redness at the wound, or calf pain, call your surgeon's office.  Constipation Prevention   Complete by:  As directed    Drink plenty of fluids.  Prune juice may be helpful.  You may use a stool softener, such as Colace (over the counter) 100 mg twice a day.  Use MiraLax (over the counter) for constipation as needed.   Diet - low sodium heart healthy   Complete by:  As directed    Discharge instructions   Complete by:  As directed    INSTRUCTIONS AFTER JOINT REPLACEMENT   Remove items at home which could result in a fall. This includes throw rugs or furniture in walking pathways ICE to the affected joint every three hours while awake for 30 minutes at a time, for at least the first 3-5 days, and then as needed for pain and swelling.  Continue to use ice for pain and swelling. You may notice swelling that will progress down to the foot and ankle.  This is normal after surgery.  Elevate your leg when you are not up walking on it.   Continue to use the breathing machine you got in the hospital (incentive spirometer) which will help keep your temperature down.  It is common for your temperature to cycle up and down following surgery, especially at night when you are not up moving around and exerting yourself.  The breathing machine keeps your lungs expanded and your temperature down.   DIET:  As you were doing prior to hospitalization, we recommend a well-balanced diet.  DRESSING / WOUND CARE / SHOWERING  You may shower 3 days after surgery, but keep the wounds dry during showering.  You may use an occlusive  plastic wrap (Press'n Seal for example), NO SOAKING/SUBMERGING IN THE BATHTUB.  If the bandage gets wet, change with a clean dry gauze.  If the incision gets wet, pat the wound dry with a clean towel.  ACTIVITY  Increase activity slowly as tolerated, but follow the weight bearing instructions below.   No driving for 6 weeks or until further direction given by your physician.  You cannot drive while taking narcotics.  No lifting or carrying greater than 10 lbs. until further directed by your surgeon. Avoid periods of inactivity such as sitting longer than an hour when not asleep. This helps prevent blood clots.  You may return to work once you are authorized by your doctor.     WEIGHT BEARING   Weight bearing as tolerated with assist device (walker, cane, etc) as directed, use it as long as suggested by your surgeon or therapist, typically at least 4-6 weeks.   EXERCISES  Results after joint replacement surgery are often greatly improved when you follow the exercise, range of motion and muscle strengthening exercises prescribed by your doctor. Safety measures are also important to protect the joint from further injury. Any time any of these exercises cause you to have increased pain or swelling, decrease what you are doing until you are comfortable again and then slowly increase them. If you have problems or questions, call your caregiver or physical therapist for advice.   Rehabilitation is important following a joint replacement. After just a few days of immobilization, the muscles of the leg can become weakened and shrink (atrophy).  These exercises are designed to build up the tone and strength of the thigh and leg muscles and to improve motion. Often times heat used for twenty to thirty minutes before working out will loosen up your tissues and help with improving the range of motion but do not  use heat for the first two weeks following surgery (sometimes heat can increase post-operative  swelling).   These exercises can be done on a training (exercise) mat, on the floor, on a table or on a bed. Use whatever works the best and is most comfortable for you.    Use music or television while you are exercising so that the exercises are a pleasant break in your day. This will make your life better with the exercises acting as a break in your routine that you can look forward to.   Perform all exercises about fifteen times, three times per day or as directed.  You should exercise both the operative leg and the other leg as well.   Exercises include:   Quad Sets - Tighten up the muscle on the front of the thigh (Quad) and hold for 5-10 seconds.   Straight Leg Raises - With your knee straight (if you were given a brace, keep it on), lift the leg to 60 degrees, hold for 3 seconds, and slowly lower the leg.  Perform this exercise against resistance later as your leg gets stronger.  Leg Slides: Lying on your back, slowly slide your foot toward your buttocks, bending your knee up off the floor (only go as far as is comfortable). Then slowly slide your foot back down until your leg is flat on the floor again.  Angel Wings: Lying on your back spread your legs to the side as far apart as you can without causing discomfort.  Hamstring Strength:  Lying on your back, push your heel against the floor with your leg straight by tightening up the muscles of your buttocks.  Repeat, but this time bend your knee to a comfortable angle, and push your heel against the floor.  You may put a pillow under the heel to make it more comfortable if necessary.   A rehabilitation program following joint replacement surgery can speed recovery and prevent re-injury in the future due to weakened muscles. Contact your doctor or a physical therapist for more information on knee rehabilitation.    CONSTIPATION  Constipation is defined medically as fewer than three stools per week and severe constipation as less than one  stool per week.  Even if you have a regular bowel pattern at home, your normal regimen is likely to be disrupted due to multiple reasons following surgery.  Combination of anesthesia, postoperative narcotics, change in appetite and fluid intake all can affect your bowels.   YOU MUST use at least one of the following options; they are listed in order of increasing strength to get the job done.  They are all available over the counter, and you may need to use some, POSSIBLY even all of these options:    Drink plenty of fluids (prune juice may be helpful) and high fiber foods Colace 100 mg by mouth twice a day  Senokot for constipation as directed and as needed Dulcolax (bisacodyl), take with full glass of water  Miralax (polyethylene glycol) once or twice a day as needed.  If you have tried all these things and are unable to have a bowel movement in the first 3-4 days after surgery call either your surgeon or your primary doctor.    If you experience loose stools or diarrhea, hold the medications until you stool forms back up.  If your symptoms do not get better within 1 week or if they get worse, check with your doctor.  If you experience "the worst abdominal pain  ever" or develop nausea or vomiting, please contact the office immediately for further recommendations for treatment.   ITCHING:  If you experience itching with your medications, try taking only a single pain pill, or even half a pain pill at a time.  You can also use Benadryl over the counter for itching or also to help with sleep.   TED HOSE STOCKINGS:  Use stockings on both legs until for at least 2 weeks or as directed by physician office. They may be removed at night for sleeping.  MEDICATIONS:  See your medication summary on the "After Visit Summary" that nursing will review with you.  You may have some home medications which will be placed on hold until you complete the course of blood thinner medication.  It is important for you to  complete the blood thinner medication as prescribed.  PRECAUTIONS:  If you experience chest pain or shortness of breath - call 911 immediately for transfer to the hospital emergency department.   If you develop a fever greater that 101 F, purulent drainage from wound, increased redness or drainage from wound, foul odor from the wound/dressing, or calf pain - CONTACT YOUR SURGEON.                                                   FOLLOW-UP APPOINTMENTS:  If you do not already have a post-op appointment, please call the office for an appointment to be seen by your surgeon.  Guidelines for how soon to be seen are listed in your "After Visit Summary", but are typically between 1-4 weeks after surgery.  OTHER INSTRUCTIONS:   Knee Replacement:  Do not place pillow under knee, focus on keeping the knee straight while resting. CPM instructions: 0-90 degrees, 2 hours in the morning, 2 hours in the afternoon, and 2 hours in the evening. Place foam block, curve side up under heel at all times except when in CPM or when walking.  DO NOT modify, tear, cut, or change the foam block in any way.  MAKE SURE YOU:  Understand these instructions.  Get help right away if you are not doing well or get worse.    Thank you for letting us be a part of your medical care team.  It is a privilege we respect greatly.  We hope these instructions will help you stay on track for a fast and full recovery!   Increase activity slowly as tolerated   Complete by:  As directed       Follow-up Information    Marcene Corning, MD. Schedule an appointment as soon as possible for a visit in 2 weeks.   Specialty:  Orthopedic Surgery Contact information: [address] [place] [address] [phone]        [facility] Follow up.   Specialty:  Home Health Services Why:  A representative from [facility] will contact you to arrange start date and time for your therapy. Contact information: [address] [place]  [address] [place] [address] [phone]            Signed: Drema Halon 06/18/2017, 6:49 AM

## 2017-06-18 NOTE — Progress Notes (Signed)
Physical Therapy Treatment Patient Details Name: Shelly Brown MRN: [id number] DOB: December 11, 1965 Today's Date: 06/18/2017    History of Present Illness Pt. is a 52 y.o. F with significant PMH of hyperlipidemia, hypertension, and cervical fusion surgery now s/p right total knee arthroplasty.    PT Comments    Pt guarded this session secondary to pain and required standing rests X 3 during gait, however, tolerated same gait distance as previous session. Required min guard to supervision for safety throughout. Reviewed knee precautions and seated HEP with pt. Will continue to follow acutely to maximize functional mobility independence and safety.    Follow Up Recommendations  Follow surgeon's recommendation for DC plan and follow-up therapies     Equipment Recommendations  Rolling walker with 5" wheels;3in1 (PT)    Recommendations for Other Services       Precautions / Restrictions Precautions Precautions: Knee Precaution Booklet Issued: Yes (comment) Precaution Comments: Reviewed knee precautions and seated HEP with pt  Required Braces or Orthoses: Knee Immobilizer - Right Restrictions Weight Bearing Restrictions: Yes RLE Weight Bearing: Weight bearing as tolerated    Mobility  Bed Mobility Overal bed mobility: Modified Independent             General bed mobility comments: Increased time required secondary to pain.   Transfers Overall transfer level: Needs assistance Equipment used: Rolling walker (2 wheeled) Transfers: Sit to/from Stand Sit to Stand: Supervision         General transfer comment: Supervision for safety. Verbal cues for hand placement.   Ambulation/Gait Ambulation/Gait assistance: Min guard;Supervision Ambulation Distance (Feet): 140 Feet Assistive device: Rolling walker (2 wheeled) Gait Pattern/deviations: Decreased step length - left;Decreased weight shift to right;Step-through pattern;Decreased step length - right;Antalgic Gait velocity:  Decreased  Gait velocity interpretation: <1.31 ft/sec, indicative of household ambulator General Gait Details: Increased pain this session, therefore limited weightshift to RLE. Did continue to use step through pattern. Verbal cues for heel strike during gait. Standing rest X 3 during gait secondary to pain.    Stairs             Wheelchair Mobility    Modified Rankin (Stroke Patients Only)       Balance Overall balance assessment: Needs assistance Sitting-balance support: Feet supported;No upper extremity supported Sitting balance-Leahy Scale: Good     Standing balance support: Bilateral upper extremity supported;During functional activity Standing balance-Leahy Scale: Poor Standing balance comment: Reliant on BUE support.                             Cognition Arousal/Alertness: Awake/alert Behavior During Therapy: WFL for tasks assessed/performed Overall Cognitive Status: Within Functional Limits for tasks assessed                                        Exercises Total Joint Exercises Long Arc Quad: Other (comment)(verbally reviewed with demonstration. ) Knee Flexion: Other (comment)(verbally reviewed with demonstration ) Goniometric ROM: 10-72    General Comments        Pertinent Vitals/Pain Pain Assessment: Faces Faces Pain Scale: Hurts even more Pain Location: R knee  Pain Descriptors / Indicators: Guarding;Aching Pain Intervention(s): Limited activity within patient's tolerance;Monitored during session;Repositioned    Home Living                      Prior Function  PT Goals (current goals can now be found in the care plan section) Acute Rehab PT Goals Patient Stated Goal: Walk across stage for diploma June 22 PT Goal Formulation: With patient Time For Goal Achievement: 06/22/17 Potential to Achieve Goals: Good Progress towards PT goals: Progressing toward goals    Frequency    7X/week       PT Plan Current plan remains appropriate    Co-evaluation              AM-PAC PT "6 Clicks" Daily Activity  Outcome Measure  Difficulty turning over in bed (including adjusting bedclothes, sheets and blankets)?: None Difficulty moving from lying on back to sitting on the side of the bed? : A Little Difficulty sitting down on and standing up from a chair with arms (e.g., wheelchair, bedside commode, etc,.)?: A Little Help needed moving to and from a bed to chair (including a wheelchair)?: A Little Help needed walking in hospital room?: A Little Help needed climbing 3-5 steps with a railing? : A Lot 6 Click Score: 18    End of Session Equipment Utilized During Treatment: Gait belt Activity Tolerance: Patient tolerated treatment well Patient left: in chair;with call bell/phone within reach Nurse Communication: Mobility status PT Visit Diagnosis: Pain;Difficulty in walking, not elsewhere classified (R26.2) Pain - Right/Left: Right Pain - part of body: Knee     Time: 1206-1229 PT Time Calculation (min) (ACUTE ONLY): 23 min  Charges:  $Gait Training: 8-22 mins $Therapeutic Activity: 8-22 mins                    G Codes:       Gladys Damme, PT, DPT  Acute Rehabilitation Services  Pager: [phone]    Lehman Prom 06/18/2017, 12:41 PM

## 2017-07-07 ENCOUNTER — Ambulatory Visit: Payer: Medicaid Other | Attending: Orthopaedic Surgery | Admitting: Physical Therapy

## 2017-07-07 ENCOUNTER — Other Ambulatory Visit: Payer: Self-pay

## 2017-07-07 DIAGNOSIS — R2689 Other abnormalities of gait and mobility: Secondary | ICD-10-CM | POA: Diagnosis present

## 2017-07-07 DIAGNOSIS — R600 Localized edema: Secondary | ICD-10-CM | POA: Diagnosis present

## 2017-07-07 DIAGNOSIS — M25561 Pain in right knee: Secondary | ICD-10-CM | POA: Diagnosis present

## 2017-07-07 DIAGNOSIS — M25661 Stiffness of right knee, not elsewhere classified: Secondary | ICD-10-CM | POA: Diagnosis not present

## 2017-07-07 NOTE — Patient Instructions (Signed)
Lie on your stomach with your knee off the edge of the bed. Feel a stretch behind the knee. Hold 1-5 minutes as tolerated.    Stand with your right foot on a step. Lunge forward bending your knee as much as possible. Repeat 10-30 times, multiple times a day. You can also hold the stretch for 30 seconds if tolerated.   Solon Palm, PT 07/07/17 11:16 AM

## 2017-07-07 NOTE — Therapy (Signed)
[facility] [address] [place], [place], [address] Phone: [phone]   Fax:  [phone]  Physical Therapy Evaluation  Patient Details  Name: Shelly Brown MRN: [id number] Date of Birth: 1965/07/31 Referring Provider: Marcene Corning   Encounter Date: 07/07/2017  PT End of Session - 07/07/17 1054    Visit Number  1    Number of Visits  4    Date for PT Re-Evaluation  08/10/17    Authorization Type  Medicaid    PT Start Time  1015    PT Stop Time  1113    PT Time Calculation (min)  58 min    Activity Tolerance  Patient tolerated treatment well    Behavior During Therapy  [facility] for tasks assessed/performed       Past Medical History:  Diagnosis Date  . ADHD (attention deficit hyperactivity disorder)   . Anxiety   . Anxiety and depression    mainly anxiety, Dr. Westley Chandler  . Anxiety state, unspecified 08/16/2013  . Arthritis    "right knee" (06/17/2017)  . Cholelithiasis 05/2013  . Chronic lower back pain   . Depression   . GERD (gastroesophageal reflux disease)    chronic gastritis on EGD 12/2012  . History of chicken pox   . Hyperlipidemia   . Hypertension   . Insomnia   . Migraine    "stopped after neck OR in 2012" (06/17/2017)  . OSA (obstructive sleep apnea)    "can't tolerate CPAP" (06/17/2017)  . Seasonal allergies   . Spinal headache    "related to epidural for c-section & after myelogram" (06/17/2017)    Past Surgical History:  Procedure Laterality Date  . ANTERIOR CERVICAL DECOMP/DISCECTOMY FUSION  05/03/2010   for herniated disc  . BACK SURGERY    . CESAREAN SECTION  2003  . CHOLECYSTECTOMY N/A 07/04/2013   LAPAROSCOPIC CHOLECYSTECTOMY WITH INTRAOPERATIVE CHOLANGIOGRAM;  Surgeon: Robyne Askew, MD  . ESOPHAGOGASTRODUODENOSCOPY  12/2012   mild chronic gastritis, rec dexilant Bing Matter)  . INTRAUTERINE DEVICE (IUD) INSERTION  ~ 2014  . JOINT REPLACEMENT    . REDUCTION MAMMAPLASTY Bilateral 12/2010   . TOTAL KNEE ARTHROPLASTY Right 06/16/2017   Procedure: TOTAL KNEE ARTHROPLASTY;  Surgeon: Marcene Corning, MD;  Location: [facility] OR;  Service: Orthopedics;  Laterality: Right;  . WISDOM TOOTH EXTRACTION  1998    There were no vitals filed for this visit.   Subjective Assessment - 07/07/17 1025    Subjective  Patient had R TKR 06/16/17. She reports mild pain and stiffness mainly.     Patient Stated Goals  Be back to normal walking and ADLS    Currently in Pain?  Yes    Pain Score  1     Pain Location  Knee    Pain Orientation  Right    Pain Descriptors / Indicators  Sore stiffness    Pain Type  Acute pain    Pain Onset  1 to 4 weeks ago    Pain Frequency  Constant    Aggravating Factors   getting up from prolonged positioning, overuse, bending    Pain Relieving Factors  ice, meds    Effect of Pain on Daily Activities  amb with AD         [facility] PT Assessment - 07/07/17 0001      Assessment   Medical Diagnosis  R TKR    Referring Provider  Marcene Corning    Onset Date/Surgical Date  06/16/17    Next MD Visit  07/17/17      Precautions   Precautions  Knee      Balance Screen   Has the patient fallen in the past 6 months  No    Has the patient had a decrease in activity level because of a fear of falling?   No    Is the patient reluctant to leave their home because of a fear of falling?   No      Home Nurse, mental health  Private residence    Living Arrangements  Children    Available Help at Discharge  Family    Type of Home  Apartment    Home Access  Stairs to enter    Entrance Stairs-Number of Steps  20    Entrance Stairs-Rails  Can reach both    Additional Comments  2nd floor apt      Prior Function   Level of Independence  Independent with basic ADLs    Vocation  Student      Observation/Other Assessments-Edema    Edema  --      Circumferential Edema   Circumferential - Left   40 cm 37.5 cm left      ROM / Strength   AROM / PROM / Strength   AROM;PROM;Strength      AROM   Overall AROM Comments  Full hip and ankle ROM    AROM Assessment Site  Knee    Right/Left Knee  Right    Right Knee Extension  -18    Right Knee Flexion  89      PROM   PROM Assessment Site  Knee    Right/Left Knee  Right    Right Knee Extension  -10    Right Knee Flexion  103      Strength   Overall Strength Comments  R knee 4+/5, R hip flex, ABD 5/5      Flexibility   Soft Tissue Assessment /Muscle Length  yes    Hamstrings  mod tightness    Piriformis  WNL      Palpation   Patella mobility  mild decrease med/lat    Palpation comment  unremarkable      Ambulation/Gait   Ambulation/Gait  Yes    Ambulation/Gait Assistance  6: Modified independent (Device/Increase time)    Ambulation Distance (Feet)  20 Feet    Assistive device  Straight cane    Gait Pattern  Step-through pattern;Decreased stance time - right;Decreased step length - left;Decreased weight shift to right;Decreased dorsiflexion - right    Ambulation Surface  Level    Pre-Gait Activities  weight shifting side to side and semi tandem stance                 Objective measurements completed on examination: See above findings.      [facility] Adult PT Treatment/Exercise - 07/07/17 0001      Modalities   Modalities  Vasopneumatic      Vasopneumatic   Number Minutes Vasopneumatic   15 minutes    Vasopnuematic Location   Knee    Vasopneumatic Pressure  Medium    Vasopneumatic Temperature   34             PT Education - 07/07/17 1118    Education provided  Yes    Education Details  HEP    Person(s) Educated  Patient    Methods  Explanation;Handout    Comprehension  Returned  demonstration          PT Long Term Goals - 07/07/17 1126      PT LONG TERM GOAL #1   Title  I with advanced HEP for ROM and strength    Time  4    Period  Weeks    Status  New    Target Date  08/10/17      PT LONG TERM GOAL #2   Title  Improved R knee ROM of 0-120 deg to  normalize gait and function.    Time  4    Period  Weeks    Status  New      PT LONG TERM GOAL #3   Title  Patient able to ambulate community distances safely without AD    Time  4    Period  Weeks    Status  New      PT LONG TERM GOAL #4   Title  Patient able to climb stairs with a reciprocal gait pattern    Time  4    Period  Weeks    Status  New             Plan - 07/07/17 1122    Clinical Impression Statement  Patient presents today for low complexity evaluation for a Right TKR. She amb with a SPC into clinic and a RW out of clinic. She has decreased ROM and strength, and gait abnormalities as well as pain. Patient will benefit from PT to address these deficits.    Clinical Presentation  Stable    Clinical Decision Making  Low    Rehab Potential  Excellent    PT Frequency  1x / week    PT Duration  4 weeks    PT Treatment/Interventions  ADLs/Self Care Home Management;Cryotherapy;Retail banker;Therapeutic exercise;Neuromuscular re-education;Patient/family education;Manual techniques;Passive range of motion;Taping;Vasopneumatic Device    PT Next Visit Plan  TKR protocol; gait/stair training, balance    PT Home Exercise Plan  prone knee ext stretch, standing lunge for knee flex; cont with previous HEP    Consulted and Agree with Plan of Care  Patient       Patient will benefit from skilled therapeutic intervention in order to improve the following deficits and impairments:  Abnormal gait, Pain, Decreased strength, Increased edema, Decreased range of motion  Visit Diagnosis: Stiffness of right knee, not elsewhere classified - Plan: PT plan of care cert/re-cert  Other abnormalities of gait and mobility - Plan: PT plan of care cert/re-cert  Acute pain of right knee - Plan: PT plan of care cert/re-cert  Localized edema - Plan: PT plan of care cert/re-cert     Problem List Patient Active Problem List   Diagnosis Date Noted  .  Primary osteoarthritis of right knee 06/16/2017  . Right knee pain 11/03/2013  . Cholecystitis with cholelithiasis 07/04/2013  . Chronic cholecystitis with calculus 06/10/2013  . Healthcare maintenance 05/28/2011  . Abdominal cramping 05/28/2011  . OSA (obstructive sleep apnea)   . Anxiety and depression   . GERD (gastroesophageal reflux disease)   . Seasonal allergies   . Hx of migraine headaches 11/05/2010  . HYPERTENSION, BENIGN 10/04/2008  . Hyperlipidemia 09/29/2008  . HEADACHE 09/29/2008    Takyia Sindt PT 07/07/2017, 11:33 AM  [facility] [address] [place], [place], [address] Phone: [phone]   Fax:  [phone]  Name: Alexzandra Bilton MRN: [id number] Date of Birth: 10-16-1965

## 2017-07-14 ENCOUNTER — Ambulatory Visit: Payer: Medicaid Other

## 2017-07-14 NOTE — Therapy (Unsigned)
[facility] [address] [place], [place], [address] Phone: [phone]   Fax:  [phone]  Physical Therapy Treatment  Patient Details  Name: Shelly Courserracy Wolden MRN: [id number] Date of Birth: Nov 11, 1965 Referring Provider: Marcene CorningPeter Dalldorf   Encounter Date: 07/14/2017  PT End of Session - 07/14/17 1101    Visit Number  --    Number of Visits  4    Date for PT Re-Evaluation  08/10/17    Authorization Type  Medicaid    PT Start Time  1101    PT Stop Time  1130    PT Time Calculation (min)  29 min       Past Medical History:  Diagnosis Date  . ADHD (attention deficit hyperactivity disorder)   . Anxiety   . Anxiety and depression    mainly anxiety, Dr. Westley ChandlerKarr  . Anxiety state, unspecified 08/16/2013  . Arthritis    "right knee" (06/17/2017)  . Cholelithiasis 05/2013  . Chronic lower back pain   . Depression   . GERD (gastroesophageal reflux disease)    chronic gastritis on EGD 12/2012  . History of chicken pox   . Hyperlipidemia   . Hypertension   . Insomnia   . Migraine    "stopped after neck OR in 2012" (06/17/2017)  . OSA (obstructive sleep apnea)    "can't tolerate CPAP" (06/17/2017)  . Seasonal allergies   . Spinal headache    "related to epidural for c-section & after myelogram" (06/17/2017)    Past Surgical History:  Procedure Laterality Date  . ANTERIOR CERVICAL DECOMP/DISCECTOMY FUSION  05/03/2010   for herniated disc  . BACK SURGERY    . CESAREAN SECTION  2003  . CHOLECYSTECTOMY N/A 07/04/2013   LAPAROSCOPIC CHOLECYSTECTOMY WITH INTRAOPERATIVE CHOLANGIOGRAM;  Surgeon: Robyne AskewPaul S Toth III, MD  . ESOPHAGOGASTRODUODENOSCOPY  12/2012   mild chronic gastritis, rec dexilant Bing Matter(Ganem Eagle)  . INTRAUTERINE DEVICE (IUD) INSERTION  ~ 2014  . JOINT REPLACEMENT    . REDUCTION MAMMAPLASTY Bilateral 12/2010  . TOTAL KNEE ARTHROPLASTY Right 06/16/2017   Procedure: TOTAL KNEE ARTHROPLASTY;  Surgeon: Marcene Corningalldorf, Peter, MD;   Location: [facility] OR;  Service: Orthopedics;  Laterality: Right;  . WISDOM TOOTH EXTRACTION  1998    There were no vitals filed for this visit.  Subjective Assessment - 07/14/17 1101    Subjective  Knee is feeling better this week. Still stiff.    Patient Stated Goals  Be back to normal walking and ADLS    Currently in Pain?  Yes    Pain Score  1     Pain Location  Knee    Pain Orientation  Right    Pain Descriptors / Indicators  Sore    Pain Type  Surgical pain                       [facility] Adult PT Treatment/Exercise - 07/14/17 0001      Exercises   Exercises  Knee/Hip      Knee/Hip Exercises: Aerobic   Stationary Bike  partial revolutions x 5 min patient has bike at apt facility    Nustep  L3 x 5 min      Knee/Hip Exercises: Standing   Heel Raises  Both;1 set;10 reps    Knee Flexion  Strengthening;Right;5 reps    Hip Flexion  Stengthening;Both;1 set;10 reps;Knee bent    Hip Abduction  Stengthening;Both;1 set;10 reps    Hip Extension  Stengthening;Both;1  set;10 reps             PT Education - 07/14/17 1132    Education provided  Yes    Education Details  HEP    Person(s) Educated  Patient    Methods  Explanation;Demonstration;Handout    Comprehension  Returned demonstration;Verbalized understanding          PT Long Term Goals - 07/07/17 1126      PT LONG TERM GOAL #1   Title  I with advanced HEP for ROM and strength    Time  4    Period  Weeks    Status  New    Target Date  08/10/17      PT LONG TERM GOAL #2   Title  Improved R knee ROM of 0-120 deg to normalize gait and function.    Time  4    Period  Weeks    Status  New      PT LONG TERM GOAL #3   Title  Patient able to ambulate community distances safely without AD    Time  4    Period  Weeks    Status  New      PT LONG TERM GOAL #4   Title  Patient able to climb stairs with a reciprocal gait pattern    Time  4    Period  Weeks    Status  New            Plan -  07/14/17 1139    Clinical Impression Statement  Patient arrived for appt, but Medicaid had denied visits due to patient having HHPT. Patient was shown HEP to do until future visits are approved.     PT Treatment/Interventions  ADLs/Self Care Home Management;Cryotherapy;Retail banker;Therapeutic exercise;Neuromuscular re-education;Patient/family education;Manual techniques;Passive range of motion;Taping;Vasopneumatic Device    PT Next Visit Plan  TKR protocol; gait/stair training, balance    PT Home Exercise Plan  prone knee ext stretch, standing lunge for knee flex; cont with previous HEP; standing hip strengthening, heel raises, bike partial rev to full    Consulted and Agree with Plan of Care  Patient       Patient will benefit from skilled therapeutic intervention in order to improve the following deficits and impairments:  Abnormal gait, Pain, Decreased strength, Increased edema, Decreased range of motion  Visit Diagnosis: Stiffness of right knee, not elsewhere classified  Other abnormalities of gait and mobility     Problem List Patient Active Problem List   Diagnosis Date Noted  . Primary osteoarthritis of right knee 06/16/2017  . Right knee pain 11/03/2013  . Cholecystitis with cholelithiasis 07/04/2013  . Chronic cholecystitis with calculus 06/10/2013  . Healthcare maintenance 05/28/2011  . Abdominal cramping 05/28/2011  . OSA (obstructive sleep apnea)   . Anxiety and depression   . GERD (gastroesophageal reflux disease)   . Seasonal allergies   . Hx of migraine headaches 11/05/2010  . HYPERTENSION, BENIGN 10/04/2008  . Hyperlipidemia 09/29/2008  . HEADACHE 09/29/2008    Ravin Bendall PT 07/14/2017, 11:41 AM  [facility] [address] [place], [place], [address] Phone: [phone]   Fax:  [phone]  Name: Shelly Brown MRN: [id number] Date of Birth: 1965/09/22

## 2017-07-14 NOTE — Patient Instructions (Signed)
Knee High   Holding stable object, raise knee to hip level, then lower knee. Repeat with other knee. Complete __10_ repetitions. Do 1-3 sets of 10. Do __2__ sessions per day.  ABDUCTION: Standing (Active)   Stand, feet flat. Lift right leg out to side. Use _0__ lbs. Complete __10_ repetitions. Do 1-3 sets of 10.  Perform __2_ sessions per day.   EXTENSION: Standing (Active)  Stand, both feet flat. Draw right leg behind body as far as possible. Do not bend forward at the waist.  Use 0___ lbs. Complete 10 repetitions. Do 1-3 sets of 10. Perform __2_ sessions per day.  Copyright  VHI. All rights reserved.   Heel Raise: Bilateral (Standing)    Rise on balls of feet. Repeat _10___ times per set. Do __1-3__ sets per session. Do __2__ sessions per day.  http://orth.exer.us/39   Copyright  VHI. All rights reserved.     WEIGHT SHIFTING ONTO RIGHT LEG AND ROCKING IN TANDEM STANCE TO HELP WITH GAIT.  Solon PalmJulie Keiston Manley, PT 07/14/17 11:30 AM;

## 2017-07-21 ENCOUNTER — Ambulatory Visit: Payer: Medicaid Other

## 2017-07-21 NOTE — Progress Notes (Signed)
Patient arrived for appt, but was not approved by Medicaid since she had had HHPT. Secretary cancelled appt before I could sign the arrived N/C note.

## 2017-07-28 ENCOUNTER — Ambulatory Visit: Payer: Medicaid Other | Admitting: Physical Therapy

## 2017-07-30 ENCOUNTER — Ambulatory Visit: Payer: Medicaid Other | Attending: Orthopaedic Surgery | Admitting: Physical Therapy

## 2017-07-30 DIAGNOSIS — R2689 Other abnormalities of gait and mobility: Secondary | ICD-10-CM | POA: Diagnosis present

## 2017-07-30 DIAGNOSIS — M25661 Stiffness of right knee, not elsewhere classified: Secondary | ICD-10-CM

## 2017-07-30 DIAGNOSIS — M25561 Pain in right knee: Secondary | ICD-10-CM | POA: Insufficient documentation

## 2017-07-30 NOTE — Therapy (Signed)
[facility] [address] [place], [place], [address] Phone: [phone]   Fax:  [phone]  Physical Therapy Treatment  Patient Details  Name: Shelly Brown MRN: [id number] Date of Birth: 06-Dec-1965 Referring Provider: Marcene Corning   Encounter Date: 07/30/2017  PT End of Session - 07/30/17 1108    Visit Number  2    Number of Visits  4    Date for PT Re-Evaluation  08/10/17    Authorization Type  Medicaid    PT Start Time  1107    PT Stop Time  1155    PT Time Calculation (min)  48 min    Activity Tolerance  Patient tolerated treatment well    Behavior During Therapy  [facility] for tasks assessed/performed       Past Medical History:  Diagnosis Date  . ADHD (attention deficit hyperactivity disorder)   . Anxiety   . Anxiety and depression    mainly anxiety, Dr. Westley Chandler  . Anxiety state, unspecified 08/16/2013  . Arthritis    "right knee" (06/17/2017)  . Cholelithiasis 05/2013  . Chronic lower back pain   . Depression   . GERD (gastroesophageal reflux disease)    chronic gastritis on EGD 12/2012  . History of chicken pox   . Hyperlipidemia   . Hypertension   . Insomnia   . Migraine    "stopped after neck OR in 2012" (06/17/2017)  . OSA (obstructive sleep apnea)    "can't tolerate CPAP" (06/17/2017)  . Seasonal allergies   . Spinal headache    "related to epidural for c-section & after myelogram" (06/17/2017)    Past Surgical History:  Procedure Laterality Date  . ANTERIOR CERVICAL DECOMP/DISCECTOMY FUSION  05/03/2010   for herniated disc  . BACK SURGERY    . CESAREAN SECTION  2003  . CHOLECYSTECTOMY N/A 07/04/2013   LAPAROSCOPIC CHOLECYSTECTOMY WITH INTRAOPERATIVE CHOLANGIOGRAM;  Surgeon: Robyne Askew, MD  . ESOPHAGOGASTRODUODENOSCOPY  12/2012   mild chronic gastritis, rec dexilant Bing Matter)  . INTRAUTERINE DEVICE (IUD) INSERTION  ~ 2014  . JOINT REPLACEMENT    . REDUCTION MAMMAPLASTY Bilateral 12/2010   . TOTAL KNEE ARTHROPLASTY Right 06/16/2017   Procedure: TOTAL KNEE ARTHROPLASTY;  Surgeon: Marcene Corning, MD;  [facility];  Service: Orthopedics;  Laterality: Right;  . WISDOM TOOTH EXTRACTION  1998    There were no vitals filed for this visit.  Subjective Assessment - 07/30/17 1108    Subjective  Haven't been able to do the bike every day like I'd like. Only 3 times.    Patient Stated Goals  Be back to normal walking and ADLS    Currently in Pain?  Yes    Pain Score  1     Pain Location  Knee    Pain Orientation  Right    Pain Descriptors / Indicators  Sore    Pain Type  Surgical pain    Pain Onset  1 to 4 weeks ago    Pain Frequency  Constant         [facility] PT Assessment - 07/30/17 0001      AROM   Right Knee Extension  -10    Right Knee Flexion  91      PROM   Right Knee Extension  -8    Right Knee Flexion  103                   [facility] Adult PT  Treatment/Exercise - 07/30/17 0001      Ambulation/Gait   Ambulation/Gait  Yes    Ambulation/Gait Assistance  6: Modified independent (Device/Increase time)    Ambulation Distance (Feet)  400 Feet    Assistive device  None    Gait Pattern  Step-through pattern;Decreased stance time - right;Decreased step length - left;Decreased hip/knee flexion - right;Decreased weight shift to right    Ambulation Surface  Level    Stairs  Yes    Stairs Assistance  6: Modified independent (Device/Increase time)    Stair Management Technique  One rail Right;Step to pattern;Alternating pattern;Forwards    Number of Stairs  20    Gait Comments  worked with exaggerated step length to increase RLE stance time and build confidence      Exercises   Exercises  Knee/Hip      Knee/Hip Exercises: Aerobic   Stationary Bike  partial revolutions x 6 min patient has bike at apt facility    Nustep  L4 x 10 min moving seat forward to increase stretch and prolonged holds at end range flexion      Manual Therapy   Manual Therapy  Joint  mobilization;Passive ROM    Joint Mobilization  patellar mobs and knee joint mobs for flex/ext    Passive ROM  passive stretch into extension; flexion in sitting                   PT Long Term Goals - 07/30/17 1203      PT LONG TERM GOAL #1   Title  I with advanced HEP for ROM and strength    Time  4    Period  Weeks    Status  On-going      PT LONG TERM GOAL #2   Title  Improved R knee ROM of 0-120 deg to normalize gait and function.    Baseline  -10 to 91 deg as of 07/30/17    Time  4    Period  Weeks    Status  On-going      PT LONG TERM GOAL #3   Title  Patient able to ambulate community distances safely without AD    Time  4    Period  Weeks    Status  On-going      PT LONG TERM GOAL #4   Title  Patient able to climb stairs with a reciprocal gait pattern    Time  4    Period  Weeks    Status  On-going            Plan - 07/30/17 1209    Clinical Impression Statement  Patient did well with PT today however she is not fully compliant with HEP. Her extension has improved, but flexion remains the same. She ambulates with SPC and is very reliant on it mainly due to fear. She did well with gait training and was able to walk independently slight decreased stance on R at end. She has quad weakness with stairs and keeps knee in a flexed position relying on her UE to assist.    Rehab Potential  Excellent    PT Frequency  1x / week    PT Duration  4 weeks    PT Treatment/Interventions  ADLs/Self Care Home Management;Cryotherapy;Retail banker;Therapeutic exercise;Neuromuscular re-education;Patient/family education;Manual techniques;Passive range of motion;Taping;Vasopneumatic Device    PT Next Visit Plan  ROM, gait, stairs    PT Home Exercise Plan  prone knee ext stretch, standing  lunge for knee flex; cont with previous HEP; standing hip strengthening, heel raises, bike partial rev to full; step ups and exaggerated walking     Consulted and Agree with Plan of Care  Patient       Patient will benefit from skilled therapeutic intervention in order to improve the following deficits and impairments:  Abnormal gait, Pain, Decreased strength, Increased edema, Decreased range of motion  Visit Diagnosis: Stiffness of right knee, not elsewhere classified  Other abnormalities of gait and mobility     Problem List Patient Active Problem List   Diagnosis Date Noted  . Primary osteoarthritis of right knee 06/16/2017  . Right knee pain 11/03/2013  . Cholecystitis with cholelithiasis 07/04/2013  . Chronic cholecystitis with calculus 06/10/2013  . Healthcare maintenance 05/28/2011  . Abdominal cramping 05/28/2011  . OSA (obstructive sleep apnea)   . Anxiety and depression   . GERD (gastroesophageal reflux disease)   . Seasonal allergies   . Hx of migraine headaches 11/05/2010  . HYPERTENSION, BENIGN 10/04/2008  . Hyperlipidemia 09/29/2008  . HEADACHE 09/29/2008    Kajol Crispen PT 07/30/2017, 12:14 PM  [facility] [address] [place], [place], [address] Phone: [phone]   Fax:  [phone]  Name: Shelly Brown MRN: [id number] Date of Birth: [birth year]

## 2017-08-07 ENCOUNTER — Ambulatory Visit: Payer: Medicaid Other | Admitting: Physical Therapy

## 2017-08-07 ENCOUNTER — Encounter: Payer: Self-pay | Admitting: Physical Therapy

## 2017-08-07 DIAGNOSIS — M25661 Stiffness of right knee, not elsewhere classified: Secondary | ICD-10-CM | POA: Diagnosis not present

## 2017-08-07 DIAGNOSIS — R2689 Other abnormalities of gait and mobility: Secondary | ICD-10-CM

## 2017-08-07 DIAGNOSIS — M25561 Pain in right knee: Secondary | ICD-10-CM

## 2017-08-07 NOTE — Therapy (Signed)
[place], [place], [address] Phone: [phone]   Fax:  [phone]  Physical Therapy Treatment  Patient Details  Name: Shelly Brown MRN: [id number] Date of Birth: Dec 31, 1965 Referring Provider: Melrose Nakayama   Encounter Date: 08/07/2017  PT End of Session - 08/07/17 0934    Visit Number  3    Number of Visits  4    Date for PT Re-Evaluation  08/10/17    Authorization Type  Medicaid    PT Start Time  0845    PT Stop Time  0930    PT Time Calculation (min)  45 min       Past Medical History:  Diagnosis Date  . ADHD (attention deficit hyperactivity disorder)   . Anxiety   . Anxiety and depression    mainly anxiety, Dr. Wylene Simmer  . Anxiety state, unspecified 08/16/2013  . Arthritis    "right knee" (06/17/2017)  . Cholelithiasis 05/2013  . Chronic lower back pain   . Depression   . GERD (gastroesophageal reflux disease)    chronic gastritis on EGD 12/2012  . History of chicken pox   . Hyperlipidemia   . Hypertension   . Insomnia   . Migraine    "stopped after neck OR in 2012" (06/17/2017)  . OSA (obstructive sleep apnea)    "can't tolerate CPAP" (06/17/2017)  . Seasonal allergies   . Spinal headache    "related to epidural for c-section & after myelogram" (06/17/2017)    Past Surgical History:  Procedure Laterality Date  . ANTERIOR CERVICAL DECOMP/DISCECTOMY FUSION  05/03/2010   for herniated disc  . BACK SURGERY    . CESAREAN SECTION  2003  . CHOLECYSTECTOMY N/A 07/04/2013   LAPAROSCOPIC CHOLECYSTECTOMY WITH INTRAOPERATIVE CHOLANGIOGRAM;  Surgeon: Merrie Roof, MD  . ESOPHAGOGASTRODUODENOSCOPY  12/2012   mild chronic gastritis, rec dexilant Jessie Foot)  . INTRAUTERINE DEVICE (IUD) INSERTION  ~ 2014  . JOINT REPLACEMENT    . REDUCTION MAMMAPLASTY Bilateral 12/2010  . TOTAL KNEE ARTHROPLASTY Right 06/16/2017   Procedure: TOTAL KNEE ARTHROPLASTY;  Surgeon: Melrose Nakayama, MD;   Location: [facility];  Service: Orthopedics;  Laterality: Right;  . WISDOM TOOTH EXTRACTION  1998    There were no vitals filed for this visit.  Subjective Assessment - 08/07/17 0847    Subjective  no bike at gym, doing ex as I can. graduated and walked across stage without cane. decreased wt shift onto RT with knee in flexion    Currently in Pain?  Yes    Pain Score  1     Pain Location  Knee    Pain Orientation  Right         [facility] PT Assessment - 08/07/17 0001      AROM   Right Knee Extension  -8    Right Knee Flexion  102                   [facility] Adult PT Treatment/Exercise - 08/07/17 0001      Ambulation/Gait   Gait Comments  worked on walking with heel strike to increase ext with stance phase and in wt shift on Left LE      Knee/Hip Exercises: Aerobic   Stationary Bike  partial revolutions x 5 min    Elliptical  2 fwd/2 back    Nustep  L 4 6 min -LE only      Knee/Hip Exercises: Machines for Strengthening  Cybex Leg Press  30# 2 sets 10, Left only 2 sets 5 with no wt for ROM calf raises 30# 2 sets 15      Manual Therapy   Manual Therapy  Joint mobilization;Passive ROM    Joint Mobilization  ext belt mobs    Passive ROM  passive stretch into extension; flexion in sitting              PT Education - 08/07/17 0934    Education provided  Yes    Education Details  step stretch and LLLD ext stretch    Person(s) Educated  Patient    Methods  Explanation;Demonstration    Comprehension  Verbalized understanding;Returned demonstration          PT Long Term Goals - 08/07/17 0937      PT LONG TERM GOAL #1   Title  I with advanced HEP for ROM and strength    Status  Partially Met      PT LONG TERM GOAL #2   Title  Improved R knee ROM of 0-120 deg to normalize gait and function.    Status  Partially Met      PT LONG TERM GOAL #3   Title  Patient able to ambulate community distances safely without AD    Status  Partially Met      PT LONG TERM  GOAL #4   Title  Patient able to climb stairs with a reciprocal gait pattern    Status  Partially Met            Plan - 08/07/17 0935    Clinical Impression Statement  pt with very poor gait but with cuing can improve-educ on need to work on this as well as showed LLD and step flexion strtech. improved ROM but measurements taken after MT    PT Treatment/Interventions  ADLs/Self Care Home Management;Cryotherapy;Occupational psychologist;Therapeutic exercise;Neuromuscular re-education;Patient/family education;Manual techniques;Passive range of motion;Taping;Vasopneumatic Device    PT Next Visit Plan  ROM, gait, stairs       Patient will benefit from skilled therapeutic intervention in order to improve the following deficits and impairments:  Abnormal gait, Pain, Decreased strength, Increased edema, Decreased range of motion  Visit Diagnosis: Stiffness of right knee, not elsewhere classified  Other abnormalities of gait and mobility  Acute pain of right knee     Problem List Patient Active Problem List   Diagnosis Date Noted  . Primary osteoarthritis of right knee 06/16/2017  . Right knee pain 11/03/2013  . Cholecystitis with cholelithiasis 07/04/2013  . Chronic cholecystitis with calculus 06/10/2013  . Healthcare maintenance 05/28/2011  . Abdominal cramping 05/28/2011  . OSA (obstructive sleep apnea)   . Anxiety and depression   . GERD (gastroesophageal reflux disease)   . Seasonal allergies   . Hx of migraine headaches 11/05/2010  . HYPERTENSION, BENIGN 10/04/2008  . Hyperlipidemia 09/29/2008  . HEADACHE 09/29/2008    Chrystina Naff,ANGIE PTA 08/07/2017, 9:37 AM  [place] [address] [place], [place], [address] Phone: [phone]   Fax:  [phone]  Name: Shelly Brown MRN: [id number] Date of Birth: 1965/02/26

## 2017-08-11 ENCOUNTER — Ambulatory Visit: Payer: Medicaid Other | Attending: Orthopaedic Surgery | Admitting: Physical Therapy

## 2017-08-11 DIAGNOSIS — M25661 Stiffness of right knee, not elsewhere classified: Secondary | ICD-10-CM | POA: Insufficient documentation

## 2017-08-11 DIAGNOSIS — R2689 Other abnormalities of gait and mobility: Secondary | ICD-10-CM | POA: Insufficient documentation

## 2017-08-11 DIAGNOSIS — R600 Localized edema: Secondary | ICD-10-CM

## 2017-08-11 DIAGNOSIS — M25561 Pain in right knee: Secondary | ICD-10-CM | POA: Diagnosis present

## 2017-08-11 NOTE — Therapy (Signed)
[facility] [address] [place], [place], [address] Phone: [phone]   Fax:  [phone]  Physical Therapy Treatment  Patient Details  Name: Shelly Courserracy Benton MRN: [id number] Date of Birth: Oct 03, 1965 Referring Provider: Marcene CorningPeter Dalldorf   Encounter Date: 08/11/2017  PT End of Session - 08/11/17 1100    Visit Number  4    Number of Visits  16    Date for PT Re-Evaluation  10/05/17    Authorization Type  Medicaid (recert for 12 more visits 7/15 - 10/05/17)    PT Start Time  1058    PT Stop Time  1145    PT Time Calculation (min)  47 min    Activity Tolerance  Patient tolerated treatment well    Behavior During Therapy  [facility] for tasks assessed/performed       Past Medical History:  Diagnosis Date  . ADHD (attention deficit hyperactivity disorder)   . Anxiety   . Anxiety and depression    mainly anxiety, Dr. Westley ChandlerKarr  . Anxiety state, unspecified 08/16/2013  . Arthritis    "right knee" (06/17/2017)  . Cholelithiasis 05/2013  . Chronic lower back pain   . Depression   . GERD (gastroesophageal reflux disease)    chronic gastritis on EGD 12/2012  . History of chicken pox   . Hyperlipidemia   . Hypertension   . Insomnia   . Migraine    "stopped after neck OR in 2012" (06/17/2017)  . OSA (obstructive sleep apnea)    "can't tolerate CPAP" (06/17/2017)  . Seasonal allergies   . Spinal headache    "related to epidural for c-section & after myelogram" (06/17/2017)    Past Surgical History:  Procedure Laterality Date  . ANTERIOR CERVICAL DECOMP/DISCECTOMY FUSION  05/03/2010   for herniated disc  . BACK SURGERY    . CESAREAN SECTION  2003  . CHOLECYSTECTOMY N/A 07/04/2013   LAPAROSCOPIC CHOLECYSTECTOMY WITH INTRAOPERATIVE CHOLANGIOGRAM;  Surgeon: Robyne AskewPaul S Toth III, MD  . ESOPHAGOGASTRODUODENOSCOPY  12/2012   mild chronic gastritis, rec dexilant Bing Matter(Ganem Eagle)  . INTRAUTERINE DEVICE (IUD) INSERTION  ~ 2014  . JOINT REPLACEMENT    .  REDUCTION MAMMAPLASTY Bilateral 12/2010  . TOTAL KNEE ARTHROPLASTY Right 06/16/2017   Procedure: TOTAL KNEE ARTHROPLASTY;  Surgeon: Marcene Corningalldorf, Peter, MD;  [facility];  Service: Orthopedics;  Laterality: Right;  . WISDOM TOOTH EXTRACTION  1998    There were no vitals filed for this visit.  Subjective Assessment - 08/11/17 1155    Subjective  No new c/o from patient.    Patient Stated Goals  Be back to normal walking and ADLS    Currently in Pain?  No/denies         [facility] - 08/11/17 0001      AROM   Right Knee Extension  -10    Right Knee Flexion  96      PROM   Right Knee Extension  -8    Right Knee Flexion  110                   [facility] Adult PT Treatment/Exercise - 08/11/17 0001      Ambulation/Gait   Gait Comments  worked on increased step length and stance time on RLE using mirrror      Knee/Hip Exercises: Aerobic   Nustep  L5 x 6 min pushing into full extension      Knee/Hip Exercises: Scientist, water qualityMachines for Strengthening  Cybex Leg Press  30# 2x10 RLE with partial LLE assist    Other Machine  heel raises 30# x 10      Knee/Hip Exercises: Standing   Forward Lunges  Right;20 reps ON STEP    Step Down  Right;2 sets;10 reps;Hand Hold: 1;Step Height: 2";Step Height: 4";Limitations heel taps    Step Down Limitations  lots of compensation with L hip and trunk    Wall Squat  20 reps with left foot on step; assisted wt shift over RLE      Manual Therapy   Manual Therapy  Joint mobilization;Passive ROM    Joint Mobilization  patellar mobs and knee flex/ext mobs    Passive ROM  passive stretch into flex and ext             PT Education - 08/11/17 1155    Education provided  Yes    Education Details  add weight to prone knee ext stretch; work on amb with Charity fundraiser) Educated  Patient    Methods  Explanation;Demonstration    Comprehension  Verbalized understanding;Returned demonstration          PT Long Term Goals - 08/11/17 1210       PT LONG TERM GOAL #1   Title  I with advanced HEP for ROM and strength    Time  4    Period  Weeks    Status  On-going      PT LONG TERM GOAL #2   Title  Improved R knee ROM of 0-120 deg to normalize gait and function.    Baseline  -10 to 96 deg active as of 08/11/17    Time  4    Period  Weeks    Status  On-going      PT LONG TERM GOAL #3   Title  Patient able to ambulate community distances safely without AD    Time  4    Period  Weeks    Status  On-going      PT LONG TERM GOAL #4   Title  Patient able to climb stairs with a reciprocal gait pattern    Time  4    Period  Weeks            Plan - 08/11/17 1159    Clinical Impression Statement  Patient continues to demonstrate poor gait mechanics. She is still using SPC, but was encouraged to stop using at home as gait improves without it. She is progressing slowly with ROM. She is still weak with concentric and eccentric quad activities limiting stair climbing to step to pattern. Patient will benefit from further therapy to address these deficits.    Rehab Potential  Excellent    PT Frequency  2x / week    PT Duration  6 weeks beginning week of 08/24/17    PT Treatment/Interventions  ADLs/Self Care Home Management;Cryotherapy;Retail banker;Therapeutic exercise;Neuromuscular re-education;Patient/family education;Manual techniques;Passive range of motion;Taping;Vasopneumatic Device    PT Next Visit Plan  ROM, gait, stairs    PT Home Exercise Plan  prone knee ext stretch, standing lunge for knee flex; cont with previous HEP; standing hip strengthening, heel raises, bike partial rev to full; step ups and exaggerated walking    Consulted and Agree with Plan of Care  Patient       Patient will benefit from skilled therapeutic intervention in order to improve the following deficits and impairments:  Abnormal gait, Pain, Decreased strength, Increased edema,  Decreased range of motion  Visit  Diagnosis: Stiffness of right knee, not elsewhere classified  Other abnormalities of gait and mobility  Acute pain of right knee  Localized edema     Problem List Patient Active Problem List   Diagnosis Date Noted  . Primary osteoarthritis of right knee 06/16/2017  . Right knee pain 11/03/2013  . Cholecystitis with cholelithiasis 07/04/2013  . Chronic cholecystitis with calculus 06/10/2013  . Healthcare maintenance 05/28/2011  . Abdominal cramping 05/28/2011  . OSA (obstructive sleep apnea)   . Anxiety and depression   . GERD (gastroesophageal reflux disease)   . Seasonal allergies   . Hx of migraine headaches 11/05/2010  . HYPERTENSION, BENIGN 10/04/2008  . Hyperlipidemia 09/29/2008  . HEADACHE 09/29/2008    Mackinley Cassaday PT 08/11/2017, 12:14 PM  [facility] [address] [place], [place], [address] Phone: [phone]   Fax:  [phone]  Name: Shelly Brown MRN: [id number] Date of Birth: 09/14/1965

## 2017-08-24 ENCOUNTER — Ambulatory Visit: Payer: Medicaid Other | Admitting: Physical Therapy

## 2017-08-24 ENCOUNTER — Encounter: Payer: Self-pay | Admitting: Physical Therapy

## 2017-08-24 DIAGNOSIS — R2689 Other abnormalities of gait and mobility: Secondary | ICD-10-CM

## 2017-08-24 DIAGNOSIS — M25661 Stiffness of right knee, not elsewhere classified: Secondary | ICD-10-CM

## 2017-08-24 DIAGNOSIS — M25561 Pain in right knee: Secondary | ICD-10-CM

## 2017-08-24 DIAGNOSIS — R600 Localized edema: Secondary | ICD-10-CM

## 2017-08-24 NOTE — Therapy (Signed)
[facility] [place] [address] [place], [place], [address] Phone: [phone]   Fax:  [phone]  Physical Therapy Treatment  Patient Details  Name: Shelly Brown MRN: [id number] Date of Birth: 01/07/1966 Referring Provider: Marcene Corning   Encounter Date: 08/24/2017  PT End of Session - 08/24/17 1352    Visit Number  5    Date for PT Re-Evaluation  10/05/17    PT Start Time  1300    PT Stop Time  1403    PT Time Calculation (min)  63 min    Activity Tolerance  Patient tolerated treatment well    Behavior During Therapy  [facility] for tasks assessed/performed       Past Medical History:  Diagnosis Date  . ADHD (attention deficit hyperactivity disorder)   . Anxiety   . Anxiety and depression    mainly anxiety, Dr. Westley Chandler  . Anxiety state, unspecified 08/16/2013  . Arthritis    "right knee" (06/17/2017)  . Cholelithiasis 05/2013  . Chronic lower back pain   . Depression   . GERD (gastroesophageal reflux disease)    chronic gastritis on EGD 12/2012  . History of chicken pox   . Hyperlipidemia   . Hypertension   . Insomnia   . Migraine    "stopped after neck OR in 2012" (06/17/2017)  . OSA (obstructive sleep apnea)    "can't tolerate CPAP" (06/17/2017)  . Seasonal allergies   . Spinal headache    "related to epidural for c-section & after myelogram" (06/17/2017)    Past Surgical History:  Procedure Laterality Date  . ANTERIOR CERVICAL DECOMP/DISCECTOMY FUSION  05/03/2010   for herniated disc  . BACK SURGERY    . CESAREAN SECTION  2003  . CHOLECYSTECTOMY N/A 07/04/2013   LAPAROSCOPIC CHOLECYSTECTOMY WITH INTRAOPERATIVE CHOLANGIOGRAM;  Surgeon: Robyne Askew, MD  . ESOPHAGOGASTRODUODENOSCOPY  12/2012   mild chronic gastritis, rec dexilant Bing Matter)  . INTRAUTERINE DEVICE (IUD) INSERTION  ~ 2014  . JOINT REPLACEMENT    . REDUCTION MAMMAPLASTY Bilateral 12/2010  . TOTAL KNEE ARTHROPLASTY Right 06/16/2017   Procedure:  TOTAL KNEE ARTHROPLASTY;  Surgeon: Marcene Corning, MD;  [facility];  Service: Orthopedics;  Laterality: Right;  . WISDOM TOOTH EXTRACTION  1998    There were no vitals filed for this visit.  Subjective Assessment - 08/24/17 1304    Subjective  "I am ok"      Currently in Pain?  Yes    Pain Score  1     Pain Location  Knee    Pain Orientation  Right                       [facility] Adult PT Treatment/Exercise - 08/24/17 0001      Ambulation/Gait   Gait Comments  worked on walking with heel strike to increase ext with stance phase and in wt shift on Left LE      Exercises   Exercises  Knee/Hip      Knee/Hip Exercises: Aerobic   Stationary Bike  seat 5 full rev x2 min     Elliptical  I 5 R3 x 3 min     Nustep  L5 x 6 min pushing into full extension      Knee/Hip Exercises: Machines for Strengthening   Cybex Knee Extension  5lb 2x15    Cybex Knee Flexion  20lb 2x15     Cybex Leg Press  30#  2x10, RLE 20lb 2x10       Knee/Hip Exercises: Seated   Sit to Sand  1 set;10 reps;without UE support lateral R pull       Modalities   Modalities  Vasopneumatic      Vasopneumatic   Number Minutes Vasopneumatic   15 minutes    Vasopnuematic Location   Knee    Vasopneumatic Pressure  Medium    Vasopneumatic Temperature   34      Manual Therapy   Manual Therapy  Joint mobilization;Passive ROM    Joint Mobilization  patellar mobs and knee flex/ext mobs    Passive ROM  passive stretch into flex and ext                  PT Long Term Goals - 08/11/17 1210      PT LONG TERM GOAL #1   Title  I with advanced HEP for ROM and strength    Time  4    Period  Weeks    Status  On-going      PT LONG TERM GOAL #2   Title  Improved R knee ROM of 0-120 deg to normalize gait and function.    Baseline  -10 to 96 deg active as of 08/11/17    Time  4    Period  Weeks    Status  On-going      PT LONG TERM GOAL #3   Title  Patient able to ambulate community distances  safely without AD    Time  4    Period  Weeks    Status  On-going      PT LONG TERM GOAL #4   Title  Patient able to climb stairs with a reciprocal gait pattern    Time  4    Period  Weeks            Plan - 08/24/17 1353    Clinical Impression Statement  Pt gait did improve some with que's. Weakness noted with SL on leg press. R knee AROM remains limited with both flexion and extension. Pt will benefit from skilled PT services to address these deficits.    Rehab Potential  Excellent    PT Frequency  2x / week    PT Treatment/Interventions  ADLs/Self Care Home Management;Cryotherapy;Retail bankerlectrical Stimulation;Gait training;Stair training;Therapeutic exercise;Neuromuscular re-education;Patient/family education;Manual techniques;Passive range of motion;Taping;Vasopneumatic Device    PT Next Visit Plan  ROM, gait, stairs       Patient will benefit from skilled therapeutic intervention in order to improve the following deficits and impairments:  Abnormal gait, Pain, Decreased strength, Increased edema, Decreased range of motion  Visit Diagnosis: Other abnormalities of gait and mobility  Stiffness of right knee, not elsewhere classified  Acute pain of right knee  Localized edema     Problem List Patient Active Problem List   Diagnosis Date Noted  . Primary osteoarthritis of right knee 06/16/2017  . Right knee pain 11/03/2013  . Cholecystitis with cholelithiasis 07/04/2013  . Chronic cholecystitis with calculus 06/10/2013  . Healthcare maintenance 05/28/2011  . Abdominal cramping 05/28/2011  . OSA (obstructive sleep apnea)   . Anxiety and depression   . GERD (gastroesophageal reflux disease)   . Seasonal allergies   . Hx of migraine headaches 11/05/2010  . HYPERTENSION, BENIGN 10/04/2008  . Hyperlipidemia 09/29/2008  . HEADACHE 09/29/2008    Grayce Sessionsonald G Kolyn Rozario 08/24/2017, 1:59 PM  [facility] [address] [place], [place], [address] Phone: [phone]   Fax:  [phone]  Name: Shelly Brown MRN: [id number] Date of Birth: 02-Nov-1965

## 2017-09-03 ENCOUNTER — Ambulatory Visit: Payer: Medicaid Other | Admitting: Physical Therapy

## 2017-09-03 ENCOUNTER — Encounter: Payer: Self-pay | Admitting: Physical Therapy

## 2017-09-03 DIAGNOSIS — M25661 Stiffness of right knee, not elsewhere classified: Secondary | ICD-10-CM

## 2017-09-03 DIAGNOSIS — R600 Localized edema: Secondary | ICD-10-CM

## 2017-09-03 DIAGNOSIS — R2689 Other abnormalities of gait and mobility: Secondary | ICD-10-CM

## 2017-09-03 DIAGNOSIS — M25561 Pain in right knee: Secondary | ICD-10-CM

## 2017-09-03 NOTE — Therapy (Signed)
[facility] [address] [place], [place], [address] Phone: [phone]   Fax:  [phone]  Physical Therapy Treatment  Patient Details  Name: Shelly Brown MRN: [id number] Date of Birth: [birth year] Referring Provider: Marcene CorningPeter Dalldorf   Encounter Date: 09/03/2017  PT End of Session - 09/03/17 1604    Visit Number  6    Date for PT Re-Evaluation  10/05/17    Authorization Type  today visit one of 6    PT Start Time  1515    PT Stop Time  1616    PT Time Calculation (min)  61 min    Activity Tolerance  Patient tolerated treatment well    Behavior During Therapy  [facility] for tasks assessed/performed       Past Medical History:  Diagnosis Date  . ADHD (attention deficit hyperactivity disorder)   . Anxiety   . Anxiety and depression    mainly anxiety, Dr. Westley ChandlerKarr  . Anxiety state, unspecified 08/16/2013  . Arthritis    "right knee" (06/17/2017)  . Cholelithiasis 05/2013  . Chronic lower back pain   . Depression   . GERD (gastroesophageal reflux disease)    chronic gastritis on EGD 12/2012  . History of chicken pox   . Hyperlipidemia   . Hypertension   . Insomnia   . Migraine    "stopped after neck OR in 2012" (06/17/2017)  . OSA (obstructive sleep apnea)    "can't tolerate CPAP" (06/17/2017)  . Seasonal allergies   . Spinal headache    "related to epidural for c-section & after myelogram" (06/17/2017)    Past Surgical History:  Procedure Laterality Date  . ANTERIOR CERVICAL DECOMP/DISCECTOMY FUSION  05/03/2010   for herniated disc  . BACK SURGERY    . CESAREAN SECTION  2003  . CHOLECYSTECTOMY N/A 07/04/2013   LAPAROSCOPIC CHOLECYSTECTOMY WITH INTRAOPERATIVE CHOLANGIOGRAM;  Surgeon: Robyne AskewPaul S Toth III, MD  . ESOPHAGOGASTRODUODENOSCOPY  12/2012   mild chronic gastritis, rec dexilant Bing Matter(Ganem Eagle)  . INTRAUTERINE DEVICE (IUD) INSERTION  ~ 2014  . JOINT REPLACEMENT    . REDUCTION MAMMAPLASTY Bilateral 12/2010  . TOTAL KNEE  ARTHROPLASTY Right 06/16/2017   Procedure: TOTAL KNEE ARTHROPLASTY;  Surgeon: Marcene Corningalldorf, Peter, MD;  Location: [facility];  Service: Orthopedics;  Laterality: Right;  . WISDOM TOOTH EXTRACTION  1998    There were no vitals filed for this visit.  Subjective Assessment - 09/03/17 1517    Subjective  "About the same "    Currently in Pain?  Yes    Pain Score  1     Pain Location  Knee    Pain Orientation  Right         [facility] PT Assessment - 09/03/17 0001      Assessment   Medical Diagnosis  R TKR      AROM   Right Knee Extension  25    Right Knee Flexion  96                   [facility] Adult PT Treatment/Exercise - 09/03/17 0001      Knee/Hip Exercises: Aerobic   Stationary Bike  seat 5 full rev x4 min     Elliptical  I 8 R4 x 3 min each way       Knee/Hip Exercises: Machines for Strengthening   Cybex Knee Extension  5lb 3x5     Cybex Leg Press  20lb RLE 2 x10  Knee/Hip Exercises: Seated   Sit to Sand  2 sets;10 reps;without UE support blue chair       Modalities   Modalities  Vasopneumatic      Vasopneumatic   Number Minutes Vasopneumatic   15 minutes    Vasopnuematic Location   Knee    Vasopneumatic Pressure  Medium    Vasopneumatic Temperature   34      Manual Therapy   Manual Therapy  Joint mobilization;Passive ROM    Joint Mobilization  Tibiofemoral Jt toincrease ext grades 3-4    Passive ROM  passive stretch into flex and ext                  PT Long Term Goals - 08/11/17 1210      PT LONG TERM GOAL #1   Title  I with advanced HEP for ROM and strength    Time  4    Period  Weeks    Status  On-going      PT LONG TERM GOAL #2   Title  Improved R knee ROM of 0-120 deg to normalize gait and function.    Baseline  -10 to 96 deg active as of 08/11/17    Time  4    Period  Weeks    Status  On-going      PT LONG TERM GOAL #3   Title  Patient able to ambulate community distances safely without AD    Time  4    Period  Weeks    Status   On-going      PT LONG TERM GOAL #4   Title  Patient able to climb stairs with a reciprocal gait pattern    Time  4    Period  Weeks            Plan - 09/03/17 1609    Clinical Impression Statement  Pt has not progressed towards goals, pt avoids pain being guarded with MT. R knee AROM has not improved with flexion and has gotten worst with extension. Pt reports that's he has been sitting at home on the recliner. Educated pt that this was not good and she needed to sit with R food elevated and no support behind knee to allow gravity to force knee into extension. Does compensate with sit to stand.  Does fair with SL activities, weakness noted and uses a lot of effort to extend R knee on leg extensions.    Rehab Potential  Good    PT Frequency  2x / week    PT Treatment/Interventions  ADLs/Self Care Home Management;Cryotherapy;Retail banker;Therapeutic exercise;Neuromuscular re-education;Patient/family education;Manual techniques;Passive range of motion;Taping;Vasopneumatic Device    PT Next Visit Plan  ROM, gait, stairs. ensure compliance with proper sitting with RLE elevated with no knee support.        Patient will benefit from skilled therapeutic intervention in order to improve the following deficits and impairments:  Abnormal gait, Pain, Decreased strength, Increased edema, Decreased range of motion  Visit Diagnosis: Other abnormalities of gait and mobility  Stiffness of right knee, not elsewhere classified  Acute pain of right knee  Localized edema     Problem List Patient Active Problem List   Diagnosis Date Noted  . Primary osteoarthritis of right knee 06/16/2017  . Right knee pain 11/03/2013  . Cholecystitis with cholelithiasis 07/04/2013  . Chronic cholecystitis with calculus 06/10/2013  . Healthcare maintenance 05/28/2011  . Abdominal cramping 05/28/2011  . OSA (obstructive sleep apnea)   .  Anxiety and depression   . GERD  (gastroesophageal reflux disease)   . Seasonal allergies   . Hx of migraine headaches 11/05/2010  . HYPERTENSION, BENIGN 10/04/2008  . Hyperlipidemia 09/29/2008  . HEADACHE 09/29/2008    Grayce Sessions, PTA 09/03/2017, 4:13 PM  [facility] [address] [place], [place], [address] Phone: [phone]   Fax:  [phone]  Name: Tatijana Bierly MRN: [id number] Date of Birth: 09/17/1965

## 2017-09-09 ENCOUNTER — Ambulatory Visit: Payer: Medicaid Other | Admitting: Physical Therapy

## 2017-09-09 DIAGNOSIS — R2689 Other abnormalities of gait and mobility: Secondary | ICD-10-CM

## 2017-09-09 DIAGNOSIS — M25661 Stiffness of right knee, not elsewhere classified: Secondary | ICD-10-CM

## 2017-09-09 DIAGNOSIS — R600 Localized edema: Secondary | ICD-10-CM

## 2017-09-09 DIAGNOSIS — M25561 Pain in right knee: Secondary | ICD-10-CM

## 2017-09-09 NOTE — Therapy (Signed)
[facility] [address] [place], [place], [address] Phone: [phone]   Fax:  [phone]  Physical Therapy Treatment  Patient Details  Name: Shelly Brown MRN: [id number] Date of Birth: [birth year] Referring Provider: Marcene Corning   Encounter Date: 09/09/2017  PT End of Session - 09/09/17 0931    Visit Number  7    Number of Visits  16    Date for PT Re-Evaluation  10/05/17    PT Start Time  0844    PT Stop Time  0943    PT Time Calculation (min)  59 min    Activity Tolerance  Patient tolerated treatment well    Behavior During Therapy  [facility] for tasks assessed/performed       Past Medical History:  Diagnosis Date  . ADHD (attention deficit hyperactivity disorder)   . Anxiety   . Anxiety and depression    mainly anxiety, Dr. Westley Chandler  . Anxiety state, unspecified 08/16/2013  . Arthritis    "right knee" (06/17/2017)  . Cholelithiasis 05/2013  . Chronic lower back pain   . Depression   . GERD (gastroesophageal reflux disease)    chronic gastritis on EGD 12/2012  . History of chicken pox   . Hyperlipidemia   . Hypertension   . Insomnia   . Migraine    "stopped after neck OR in 2012" (06/17/2017)  . OSA (obstructive sleep apnea)    "can't tolerate CPAP" (06/17/2017)  . Seasonal allergies   . Spinal headache    "related to epidural for c-section & after myelogram" (06/17/2017)    Past Surgical History:  Procedure Laterality Date  . ANTERIOR CERVICAL DECOMP/DISCECTOMY FUSION  05/03/2010   for herniated disc  . BACK SURGERY    . CESAREAN SECTION  2003  . CHOLECYSTECTOMY N/A 07/04/2013   LAPAROSCOPIC CHOLECYSTECTOMY WITH INTRAOPERATIVE CHOLANGIOGRAM;  Surgeon: Robyne Askew, MD  . ESOPHAGOGASTRODUODENOSCOPY  12/2012   mild chronic gastritis, rec dexilant Bing Matter)  . INTRAUTERINE DEVICE (IUD) INSERTION  ~ 2014  . JOINT REPLACEMENT    . REDUCTION MAMMAPLASTY Bilateral 12/2010  . TOTAL KNEE ARTHROPLASTY Right  06/16/2017   Procedure: TOTAL KNEE ARTHROPLASTY;  Surgeon: Marcene Corning, MD;  Location[facility];  Service: Orthopedics;  Laterality: Right;  . WISDOM TOOTH EXTRACTION  1998    There were no vitals filed for this visit.  Subjective Assessment - 09/09/17 0847    Subjective  Pt reports doing okay, but stiff and not feeling good this morning.     Currently in Pain?  Yes    Pain Score  2     Pain Location  Knee    Pain Orientation  Medial;Right         [facility] PT Assessment - 09/09/17 0001      AROM   Right Knee Extension  19                   [facility] Adult PT Treatment/Exercise - 09/09/17 0001      Knee/Hip Exercises: Aerobic   Stationary Bike  seat 5 full rev x4 min     Elliptical  I 8 R4 x 3 min fwd/ 4 min back      Knee/Hip Exercises: Machines for Strengthening   Cybex Knee Extension  5lb 3x5 RLE     Cybex Leg Press  20lb RLE 2 x10      Knee/Hip Exercises: Seated   Sit to Sand  2 sets;10 reps;without  UE support      Modalities   Modalities  Vasopneumatic      Vasopneumatic   Number Minutes Vasopneumatic   15 minutes    Vasopnuematic Location   Knee    Vasopneumatic Pressure  Medium    Vasopneumatic Temperature   34      Manual Therapy   Manual Therapy  Joint mobilization;Passive ROM    Joint Mobilization  Tibiofemoral Jt toincrease ext grades 3-4    Passive ROM  passive stretch into flex and ext                  PT Long Term Goals - 08/11/17 1210      PT LONG TERM GOAL #1   Title  I with advanced HEP for ROM and strength    Time  4    Period  Weeks    Status  On-going      PT LONG TERM GOAL #2   Title  Improved R knee ROM of 0-120 deg to normalize gait and function.    Baseline  -10 to 96 deg active as of 08/11/17    Time  4    Period  Weeks    Status  On-going      PT LONG TERM GOAL #3   Title  Patient able to ambulate community distances safely without AD    Time  4    Period  Weeks    Status  On-going      PT LONG TERM GOAL #4    Title  Patient able to climb stairs with a reciprocal gait pattern    Time  4    Period  Weeks            Plan - 09/09/17 1610    Clinical Impression Statement  Pt reported very stiff following last therapy session. Pt stated she has been propping heel up on pillow when sitting to encourage extension. Pt required verbal cueing to get feet in alignment and distribute load evenly through the RLE. PT remains guarded during knee mobs and PROM.     Rehab Potential  Good    PT Frequency  2x / week    PT Duration  6 weeks    PT Treatment/Interventions  ADLs/Self Care Home Management;Cryotherapy;Retail banker;Therapeutic exercise;Neuromuscular re-education;Patient/family education;Manual techniques;Passive range of motion;Taping;Vasopneumatic Device    PT Next Visit Plan  ROM, gait, stairs. ensure complance with proper sitting with RLE elevated with no knee support.     PT Home Exercise Plan  prone knee ext stretch, standing lunge for knee flex; cont with previous HEP; standing hip strengthening, heel raises, bike partial rev to full; step ups and exaggerated walking       Patient will benefit from skilled therapeutic intervention in order to improve the following deficits and impairments:  Abnormal gait, Pain, Decreased strength, Increased edema, Decreased range of motion  Visit Diagnosis: Other abnormalities of gait and mobility  Stiffness of right knee, not elsewhere classified  Acute pain of right knee  Localized edema     Problem List Patient Active Problem List   Diagnosis Date Noted  . Primary osteoarthritis of right knee 06/16/2017  . Right knee pain 11/03/2013  . Cholecystitis with cholelithiasis 07/04/2013  . Chronic cholecystitis with calculus 06/10/2013  . Healthcare maintenance 05/28/2011  . Abdominal cramping 05/28/2011  . OSA (obstructive sleep apnea)   . Anxiety and depression   . GERD (gastroesophageal reflux disease)    . Seasonal  allergies   . Hx of migraine headaches 11/05/2010  . HYPERTENSION, BENIGN 10/04/2008  . Hyperlipidemia 09/29/2008  . HEADACHE 09/29/2008    Margarita MailStephen Baraka Klatt, SPTA 09/09/2017, 9:36 AM  [facility] [address] [place], [place], [address] Phone: [phone]   Fax:  [phone]  Name: Shelly Brown MRN: [id number] Date of Birth: [birth year]

## 2017-09-14 ENCOUNTER — Ambulatory Visit: Payer: Medicaid Other | Attending: Orthopaedic Surgery | Admitting: Physical Therapy

## 2017-09-14 ENCOUNTER — Encounter: Payer: Self-pay | Admitting: Physical Therapy

## 2017-09-14 DIAGNOSIS — R600 Localized edema: Secondary | ICD-10-CM | POA: Insufficient documentation

## 2017-09-14 DIAGNOSIS — M25661 Stiffness of right knee, not elsewhere classified: Secondary | ICD-10-CM

## 2017-09-14 DIAGNOSIS — M25561 Pain in right knee: Secondary | ICD-10-CM | POA: Insufficient documentation

## 2017-09-14 DIAGNOSIS — R2689 Other abnormalities of gait and mobility: Secondary | ICD-10-CM | POA: Insufficient documentation

## 2017-09-14 NOTE — Therapy (Addendum)
[place], [place], [address] Phone: [phone]   Fax:  [phone]  Physical Therapy Treatment  Patient Details  Name: Shelly Brown MRN: [id number] Date of Birth: 06-01-1965 Referring Provider: Melrose Nakayama   Encounter Date: 09/14/2017  PT End of Session - 09/14/17 1345    Visit Number  8    Date for PT Re-Evaluation  10/05/17    PT Start Time  1300    PT Stop Time  1354    PT Time Calculation (min)  54 min    Activity Tolerance  Patient tolerated treatment well    Behavior During Therapy  [facility] for tasks assessed/performed       Past Medical History:  Diagnosis Date  . ADHD (attention deficit hyperactivity disorder)   . Anxiety   . Anxiety and depression    mainly anxiety, Dr. Wylene Simmer  . Anxiety state, unspecified 08/16/2013  . Arthritis    "right knee" (06/17/2017)  . Cholelithiasis 05/2013  . Chronic lower back pain   . Depression   . GERD (gastroesophageal reflux disease)    chronic gastritis on EGD 12/2012  . History of chicken pox   . Hyperlipidemia   . Hypertension   . Insomnia   . Migraine    "stopped after neck OR in 2012" (06/17/2017)  . OSA (obstructive sleep apnea)    "can't tolerate CPAP" (06/17/2017)  . Seasonal allergies   . Spinal headache    "related to epidural for c-section & after myelogram" (06/17/2017)    Past Surgical History:  Procedure Laterality Date  . ANTERIOR CERVICAL DECOMP/DISCECTOMY FUSION  05/03/2010   for herniated disc  . BACK SURGERY    . CESAREAN SECTION  2003  . CHOLECYSTECTOMY N/A 07/04/2013   LAPAROSCOPIC CHOLECYSTECTOMY WITH INTRAOPERATIVE CHOLANGIOGRAM;  Surgeon: Merrie Roof, MD  . ESOPHAGOGASTRODUODENOSCOPY  12/2012   mild chronic gastritis, rec dexilant Jessie Foot)  . INTRAUTERINE DEVICE (IUD) INSERTION  ~ 2014  . JOINT REPLACEMENT    . REDUCTION MAMMAPLASTY Bilateral 12/2010  . TOTAL KNEE ARTHROPLASTY Right 06/16/2017   Procedure:  TOTAL KNEE ARTHROPLASTY;  Surgeon: Melrose Nakayama, MD;  Location: [facility];  Service: Orthopedics;  Laterality: Right;  . WISDOM TOOTH EXTRACTION  1998    There were no vitals filed for this visit.  Subjective Assessment - 09/14/17 1306    Subjective  "it is going"    Currently in Pain?  Yes    Pain Score  2     Pain Location  Knee         [facility] PT Assessment - 09/14/17 0001      AROM   Right Knee Extension  18    Right Knee Flexion  102                   [facility] Adult PT Treatment/Exercise - 09/14/17 0001      Ambulation/Gait   Stairs  Yes    Stairs Assistance  5: Supervision;4: Min guard    Stairs Assistance Details (indicate cue type and reason)  CGA when controling descent with RLE     Stair Management Technique  One rail Right;Alternating pattern    Number of Stairs  36    Height of Stairs  6      Knee/Hip Exercises: Aerobic   Stationary Bike  seat 5 full rev x4 min     Elliptical  I10 R 5 3 min each way  Knee/Hip Exercises: Machines for Strengthening   Cybex Leg Press  20lb RLE 2 x10      Knee/Hip Exercises: Seated   Long Arc Quad  Right;2 sets;10 reps;Weights    Long Arc Quad Weight  3 lbs.    Hamstring Curl  Both;2 sets;15 reps    Hamstring Limitations  green tband      Modalities   Modalities  Cryotherapy      Cryotherapy   Number Minutes Cryotherapy  10 Minutes    Cryotherapy Location  Knee    Type of Cryotherapy  Ice pack      Manual Therapy   Manual Therapy  Joint mobilization;Passive ROM    Joint Mobilization  Tibiofemoral Jt toincrease ext grades 3-4    Passive ROM  passive stretch into flex and ext                  PT Long Term Goals - 08/11/17 1210      PT LONG TERM GOAL #1   Title  I with advanced HEP for ROM and strength    Time  4    Period  Weeks    Status  On-going      PT LONG TERM GOAL #2   Title  Improved R knee ROM of 0-120 deg to normalize gait and function.    Baseline  -10 to 96 deg active as of  08/11/17    Time  4    Period  Weeks    Status  On-going      PT LONG TERM GOAL #3   Title  Patient able to ambulate community distances safely without AD    Time  4    Period  Weeks    Status  On-going      PT LONG TERM GOAL #4   Title  Patient able to climb stairs with a reciprocal gait pattern    Time  4    Period  Weeks            Plan - 09/14/17 1345    Clinical Impression Statement  VC throughout to promote heel strike and  knee extension. R knee AROM remains limited with extension. Pt is guarded with MT.  Progressed to stair negotiation with an alternating pattern. CGA required when controlling the descent with RLE.     Rehab Potential  Good    PT Frequency  2x / week    PT Duration  6 weeks    PT Treatment/Interventions  ADLs/Self Care Home Management;Cryotherapy;Occupational psychologist;Therapeutic exercise;Neuromuscular re-education;Patient/family education;Manual techniques;Passive range of motion;Taping;Vasopneumatic Device    PT Next Visit Plan  ROM, gait, stairs. ensure compliance with proper sitting with RLE elevated with no knee support.        Patient will benefit from skilled therapeutic intervention in order to improve the following deficits and impairments:  Abnormal gait, Pain, Decreased strength, Increased edema, Decreased range of motion  Visit Diagnosis: Other abnormalities of gait and mobility  Stiffness of right knee, not elsewhere classified  Acute pain of right knee  Localized edema     Problem List Patient Active Problem List   Diagnosis Date Noted  . Primary osteoarthritis of right knee 06/16/2017  . Right knee pain 11/03/2013  . Cholecystitis with cholelithiasis 07/04/2013  . Chronic cholecystitis with calculus 06/10/2013  . Healthcare maintenance 05/28/2011  . Abdominal cramping 05/28/2011  . OSA (obstructive sleep apnea)   . Anxiety and depression   . GERD (gastroesophageal reflux disease)   .  Seasonal allergies   . Hx of migraine headaches 11/05/2010  . HYPERTENSION, BENIGN 10/04/2008  . Hyperlipidemia 09/29/2008  . HEADACHE 09/29/2008    Scot Jun, PTA 09/14/2017, 1:51 PM  [place], [place], [address] Phone: [phone]   Fax:  [phone]  Name: Jini Horiuchi MRN: [id number] Date of Birth: 1965-10-20  PHYSICAL THERAPY DISCHARGE SUMMARY  Visits from Start of Care:8  Current functional level related to goals / functional outcomes: See above   Remaining deficits: See above   Education / Equipment: HEP  Plan: Patient agrees to discharge.  Patient goals were not met. Patient is being discharged due to not returning since the last visit.  ?????    Madelyn Flavors, PT 03/08/18 1:22 PM [place] [address] [place], [place], [address] Phone: [phone]   Fax:  [phone]

## 2017-09-23 ENCOUNTER — Ambulatory Visit: Payer: Medicaid Other | Admitting: Physical Therapy

## 2017-12-30 NOTE — Progress Notes (Signed)
[facility] - Clinic Note  12/31/2017     CHIEF COMPLAINT Patient presents for Retina Evaluation   HISTORY OF PRESENT ILLNESS: Shelly Brown is a 52 y.o. female who presents to the clinic today for:   HPI    Retina Evaluation    In both eyes.  This started 3 years ago.  Associated Symptoms Floaters and Photophobia.  Negative for Flashes, Pain, Trauma, Fever, Weight Loss, Scalp Tenderness, Redness, Distortion, Blind Spot, Glare, Jaw Claudication, Fatigue and Shoulder/Hip pain.  Context:  distance vision, mid-range vision and near vision.  Treatments tried include no treatments.  I, the attending physician,  performed the HPI with the patient and updated documentation appropriately.          Comments    Referral of DR. Groat for retina eval. Patient states she has had floaters for many years OS, driving at night bright light occasionally brother her eyes. Pt states Dr. Dione Booze has been monitoring a mole os for appx a year. Pt denies Vit's/gtt's       Last edited by Rennis Chris, MD on 12/31/2017  9:29 AM. (History)    pt states she is a regular pt of Dr. Fabian Sharp, she states she saw him for routine eye exam, she states he saw a spot in the back of her eye last year that he did not mention, but he saw it again this year, pt states Dr. Dione Booze did not call it a mole or freckle, pt has no problems with vision other than wearing glasses  Referring physician: Olivia Canter, MD [address] [place], [place] [address]  HISTORICAL INFORMATION:   Selected notes from the MEDICAL RECORD NUMBER Referred by Dr. Fabian Sharp for concern of CHRPE OS LEE: 11.17.19 (S. Groat) [BCVA: OD: 20/25++ OS: 20/20] Ocular Hx-blepharitis OU, DES OU, CHRPE OS PMH-ADHD, anxiety, arthritis, depression, HLD, HTN, migraines    CURRENT MEDICATIONS: No current outpatient medications on file. (Ophthalmic Drugs)   No current facility-administered medications for this visit.   (Ophthalmic Drugs)   Current Outpatient Medications (Other)  Medication Sig  . aspirin-acetaminophen-caffeine (EXCEDRIN MIGRAINE) 250-250-65 MG tablet Take 2 tablets by mouth daily as needed for migraine.  . bisacodyl (DULCOLAX) 5 MG EC tablet Take 1 tablet (5 mg total) by mouth daily as needed for moderate constipation.  Marland Kitchen buPROPion (WELLBUTRIN XL) 150 MG 24 hr tablet Take 150 mg by mouth daily.  . clonazePAM (KLONOPIN) 1 MG tablet Take 1 mg by mouth at bedtime.  . docusate sodium (COLACE) 100 MG capsule Take 1 capsule (100 mg total) by mouth 2 (two) times daily.  . hydrochlorothiazide (HYDRODIURIL) 25 MG tablet Take 25 mg by mouth daily.  Marland Kitchen lisdexamfetamine (VYVANSE) 60 MG capsule Take 60 mg by mouth every morning.  . metoprolol tartrate (LOPRESSOR) 50 MG tablet Take 50 mg by mouth 2 (two) times daily.  Marland Kitchen omeprazole (PRILOSEC) 20 MG capsule Take 20 mg by mouth daily.  . pravastatin (PRAVACHOL) 20 MG tablet Take 20 mg by mouth at bedtime.   Marland Kitchen QUEtiapine (SEROQUEL) 25 MG tablet Take 25 mg by mouth at bedtime.  . Simethicone (GAS-X PO) Take 2 tablets by mouth daily as needed (gas).  Marland Kitchen tiZANidine (ZANAFLEX) 4 MG tablet Take 1 tablet (4 mg total) by mouth every 6 (six) hours as needed.  Marland Kitchen aspirin EC 325 MG EC tablet Take 1 tablet (325 mg total) by mouth 2 (two) times daily after a meal. (Patient not taking:  Reported on 07/07/2017)  . lidocaine (LIDODERM) 5 % Place 1 patch onto the skin daily. Remove & Discard patch within 12 hours or as directed by MD (Patient not taking: Reported on 06/02/2017)  . oxyCODONE-acetaminophen (PERCOCET) 5-325 MG tablet Take 1-2 tablets by mouth every 4 (four) hours as needed for severe pain. (Patient not taking: Reported on 12/31/2017)   No current facility-administered medications for this visit.  (Other)      REVIEW OF SYSTEMS: ROS    Positive for: Eyes   Negative for: Constitutional, Gastrointestinal, Neurological, Skin, Genitourinary, Musculoskeletal, HENT,  Endocrine, Cardiovascular, Respiratory, Psychiatric, Allergic/Imm, Heme/Lymph   Last edited by Eldridge Scot, LPN on 16/11/9602  8:33 AM. (History)       ALLERGIES Allergies  Allergen Reactions  . Morphine And Related Other (See Comments)    Hallucinations-     PAST MEDICAL HISTORY Past Medical History:  Diagnosis Date  . ADHD (attention deficit hyperactivity disorder)   . Anxiety   . Anxiety and depression    mainly anxiety, Dr. Westley Chandler  . Anxiety state, unspecified 08/16/2013  . Arthritis    "right knee" (06/17/2017)  . Cholelithiasis 05/2013  . Chronic lower back pain   . Depression   . GERD (gastroesophageal reflux disease)    chronic gastritis on EGD 12/2012  . History of chicken pox   . Hyperlipidemia   . Hypertension   . Insomnia   . Migraine    "stopped after neck OR in 2012" (06/17/2017)  . OSA (obstructive sleep apnea)    "can't tolerate CPAP" (06/17/2017)  . Seasonal allergies   . Spinal headache    "related to epidural for c-section & after myelogram" (06/17/2017)   Past Surgical History:  Procedure Laterality Date  . ANTERIOR CERVICAL DECOMP/DISCECTOMY FUSION  05/03/2010   for herniated disc  . BACK SURGERY    . CESAREAN SECTION  2003  . CHOLECYSTECTOMY N/A 07/04/2013   LAPAROSCOPIC CHOLECYSTECTOMY WITH INTRAOPERATIVE CHOLANGIOGRAM;  Surgeon: Robyne Askew, MD  . ESOPHAGOGASTRODUODENOSCOPY  12/2012   mild chronic gastritis, rec dexilant Bing Matter)  . INTRAUTERINE DEVICE (IUD) INSERTION  ~ 2014  . JOINT REPLACEMENT    . REDUCTION MAMMAPLASTY Bilateral 12/2010  . TOTAL KNEE ARTHROPLASTY Right 06/16/2017   Procedure: TOTAL KNEE ARTHROPLASTY;  Surgeon: Marcene Corning, MD;  Location: [facility] OR;  Service: Orthopedics;  Laterality: Right;  . WISDOM TOOTH EXTRACTION  1998    FAMILY HISTORY Family History  Problem Relation Age of Onset  . Coronary artery disease Paternal Grandfather 65       MI  . Heart attack Paternal Grandfather   . Arthritis Father   .  Coronary artery disease Father 70       MI  . Heart attack Father   . Arthritis Mother   . Lung cancer Mother   . Arthritis Maternal Grandfather   . Arthritis Maternal Grandmother   . Migraines Maternal Grandmother   . Migraines Sister   . Coronary artery disease Paternal Uncle   . Stroke Sister   . Diabetes Neg Hx   . Cancer Neg Hx     SOCIAL HISTORY Social History   Tobacco Use  . Smoking status: Never Smoker  . Smokeless tobacco: Never Used  Substance Use Topics  . Alcohol use: Yes    Alcohol/week: 6.0 standard drinks    Types: 4 Glasses of wine, 2 Cans of beer per week  . Drug use: No         OPHTHALMIC EXAM:  Base Eye Exam    Visual Acuity (Snellen - Linear)      Right Left   Dist cc 20/30 20/20 -1   Dist ph cc 20/25 20/20   Correction:  Glasses       Tonometry (Tonopen, 8:39 AM)      Right Left   Pressure 14 14       Pupils      Dark Light Shape React APD   Right 3 2 Round Brisk None   Left 3 2 Round Brisk None       Visual Fields (Counting fingers)      Left Right    Full Full       Extraocular Movement      Right Left    Full, Ortho Full, Ortho       Neuro/Psych    Oriented x3:  Yes       Dilation    Both eyes:  1.0% Mydriacyl, 2.5% Phenylephrine @ 8:36 AM        Slit Lamp and Fundus Exam    Slit Lamp Exam      Right Left   Lids/Lashes Dermatochalasis - upper lid Dermatochalasis - upper lid   Conjunctiva/Sclera White and quiet White and quiet   Cornea 1+ Punctate epithelial erosions, Debris in tear film 1+ Punctate epithelial erosions, Debris in tear film   Anterior Chamber moderate depth, Narrow angle moderate depth, Narrow angle   Iris Round and moderately dilated to 6mm Round and moderately dilated to 6mm   Lens 2+ Nuclear sclerosis, 2+ Cortical cataract 2+ Nuclear sclerosis, 2+ Cortical cataract   Vitreous Vitreous syneresis, Posterior vitreous detachment Vitreous syneresis, Posterior vitreous detachment       Fundus Exam       Right Left   Disc Tilted disc, Compact, temporal Peripapillary atrophy Tilted disc, temporal Peripapillary atrophy   C/D Ratio 0.2 0.1   Macula Flat, Good foveal reflex, mild RPE mottling and clumping, No heme or edema Flat, Good foveal reflex, mild Retinal pigment epithelial mottling, no edema, trace Epiretinal membrane superior to fovea   Vessels Vascular attenuation Vascular attenuation   Periphery Attached, mild reticular degenration Attached, scattered cystoid degeneration, flat pigmented lesion at 0830-900 equator, darkly pigmented lesion from 1100-1200 almost to ora with +lacuna          IMAGING AND PROCEDURES  Imaging and Procedures for @TODAY @  OCT, Retina - OU - Both Eyes       Right Eye Quality was good. Central Foveal Thickness: 275. Progression has no prior data. Findings include normal foveal contour, no SRF, no IRF.   Left Eye Quality was good. Central Foveal Thickness: 289. Progression has no prior data. Findings include normal foveal contour, no SRF, no IRF.   Notes *Images captured and stored on drive  Diagnosis / Impression:  NFP, No IRF/SRF OU   Clinical management:  See below  Abbreviations: NFP - Normal foveal profile. CME - cystoid macular edema. PED - pigment epithelial detachment. IRF - intraretinal fluid. SRF - subretinal fluid. EZ - ellipsoid zone. ERM - epiretinal membrane. ORA - outer retinal atrophy. ORT - outer retinal tubulation. SRHM - subretinal hyper-reflective material        Color Fundus Photography Optos - OU - Both Eyes       Right Eye Disc findings include (Tilted w/ PPA). Macula : normal observations. Vessels : normal observations. Periphery : normal observations.   Left Eye Disc findings include (Tilted w/ PPA). Macula : normal  observations. Vessels : normal observations. Periphery : RPE abnormality (Nasal and superior CHRPEs).                 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 Posterior vitreous detachment of both eyes H43.813   3. Essential hypertension I10   4. Hypertensive retinopathy of both eyes H35.033   5. Retinal edema H35.81 OCT, Retina - OU - Both Eyes  6. Combined forms of age-related cataract of both eyes H25.813     1. CHRPE OS - 2 flat pigmented lesions at 0830-0900 equator and superiorly at 1100-1200 ora OS - +lacunae; no SRF or heme - discussed findings and prognosis - baseline Optos color fundus and AF images obtained to track change/progression - monitor  2. PVD / vitreous syneresis OU  Discussed findings and prognosis  No RT or RD on 360 scleral depressed exam  Reviewed s/s of RT/RD  Strict return precautions for any such RT/RD signs/symptoms   3,4.  Hypertensive retinopathy OU - discussed importance of tight BP control - monitor  5. No retinal edema on exam or OCT  6. Mixed Cataract - The symptoms of cataract, surgical options, and treatments and risks were discussed with patient. - discussed diagnosis and progression - not yet visually significant - monitor for now   Ophthalmic Meds Ordered this visit:  No orders of the defined types were placed in this encounter.      Return in about 1 year (around 01/01/2019) for f/u CHRPE.  There are no Patient Instructions on file for this visit.   Explained the diagnoses, plan, and follow up with the patient and they expressed understanding.  Patient expressed understanding of the importance of proper follow up care.   This document serves as a record of services personally performed by Karie Chimera, MD, PhD. It was created on their behalf by Laurian Brim, OA, an ophthalmic assistant. The creation of this record is the provider's dictation and/or activities during the visit.    Electronically signed by: Laurian Brim, OA  11.20.19 3:02 PM    Karie Chimera, M.D., Ph.D. Diseases & Surgery of the  Retina and Vitreous [facility]   I have reviewed the above documentation for accuracy and completeness, and I agree with the above. Karie Chimera, M.D., Ph.D. 12/31/17 3:02 PM    Abbreviations: M myopia (nearsighted); A astigmatism; H hyperopia (farsighted); P presbyopia; Mrx spectacle prescription;  CTL contact lenses; OD right eye; OS left eye; OU both eyes  XT exotropia; ET esotropia; PEK punctate epithelial keratitis; PEE punctate epithelial erosions; DES dry eye syndrome; MGD meibomian gland dysfunction; ATs artificial tears; PFAT's preservative free artificial tears; NSC nuclear sclerotic cataract; PSC posterior subcapsular cataract; ERM epi-retinal membrane; PVD posterior vitreous detachment; RD retinal detachment; DM diabetes mellitus; DR diabetic retinopathy; NPDR non-proliferative diabetic retinopathy; PDR proliferative diabetic retinopathy; CSME clinically significant macular edema; DME diabetic macular edema; dbh dot blot hemorrhages; CWS cotton wool spot; POAG primary open angle glaucoma; C/D cup-to-disc ratio; HVF humphrey visual field; GVF goldmann visual field; OCT optical coherence tomography; IOP intraocular pressure; BRVO Branch retinal vein occlusion; CRVO central retinal vein occlusion; CRAO central retinal artery occlusion; BRAO branch retinal artery occlusion; RT retinal tear; SB scleral buckle; PPV pars plana vitrectomy; VH Vitreous hemorrhage; PRP panretinal laser photocoagulation; IVK intravitreal kenalog; VMT vitreomacular traction; MH Macular hole;  NVD neovascularization of the disc; NVE neovascularization elsewhere; AREDS age related eye disease study; ARMD age related macular degeneration; POAG primary open angle glaucoma; EBMD epithelial/anterior basement membrane dystrophy; ACIOL anterior chamber intraocular lens; IOL intraocular lens; PCIOL posterior chamber intraocular lens; Phaco/IOL phacoemulsification with intraocular lens placement; PRK  photorefractive keratectomy; LASIK laser assisted in situ keratomileusis; HTN hypertension; DM diabetes mellitus; COPD chronic obstructive pulmonary disease

## 2017-12-31 ENCOUNTER — Encounter (INDEPENDENT_AMBULATORY_CARE_PROVIDER_SITE_OTHER): Payer: Self-pay | Admitting: Ophthalmology

## 2017-12-31 ENCOUNTER — Ambulatory Visit (INDEPENDENT_AMBULATORY_CARE_PROVIDER_SITE_OTHER): Payer: Medicaid Other | Admitting: Ophthalmology

## 2017-12-31 DIAGNOSIS — Q141 Congenital malformation of retina: Secondary | ICD-10-CM | POA: Diagnosis not present

## 2017-12-31 DIAGNOSIS — H43813 Vitreous degeneration, bilateral: Secondary | ICD-10-CM | POA: Diagnosis not present

## 2017-12-31 DIAGNOSIS — H25813 Combined forms of age-related cataract, bilateral: Secondary | ICD-10-CM

## 2017-12-31 DIAGNOSIS — H3581 Retinal edema: Secondary | ICD-10-CM

## 2017-12-31 DIAGNOSIS — I10 Essential (primary) hypertension: Secondary | ICD-10-CM | POA: Diagnosis not present

## 2017-12-31 DIAGNOSIS — H35033 Hypertensive retinopathy, bilateral: Secondary | ICD-10-CM

## 2018-06-09 ENCOUNTER — Ambulatory Visit: Payer: Medicaid Other | Attending: Orthopaedic Surgery | Admitting: Physical Therapy

## 2018-06-09 ENCOUNTER — Encounter: Payer: Self-pay | Admitting: Physical Therapy

## 2018-06-09 ENCOUNTER — Other Ambulatory Visit: Payer: Self-pay

## 2018-06-09 DIAGNOSIS — M25561 Pain in right knee: Secondary | ICD-10-CM | POA: Insufficient documentation

## 2018-06-09 DIAGNOSIS — R2689 Other abnormalities of gait and mobility: Secondary | ICD-10-CM | POA: Diagnosis present

## 2018-06-09 DIAGNOSIS — G8929 Other chronic pain: Secondary | ICD-10-CM

## 2018-06-09 DIAGNOSIS — R600 Localized edema: Secondary | ICD-10-CM | POA: Diagnosis present

## 2018-06-09 DIAGNOSIS — M25661 Stiffness of right knee, not elsewhere classified: Secondary | ICD-10-CM | POA: Diagnosis not present

## 2018-06-09 DIAGNOSIS — M6281 Muscle weakness (generalized): Secondary | ICD-10-CM

## 2018-06-09 NOTE — Therapy (Signed)
[facility] [address]  [address] [place], [place], [address] Phone: [phone]   Fax:  [phone]  Physical Therapy Evaluation  Patient Details  Name: Shelly Brown MRN: [id number] Date of Birth: October 28, 1965 Referring Provider (PT): Lubertha Basque. Jerl Santos, MD   Encounter Date: 06/09/2018  PT End of Session - 06/09/18 1010    Visit Number  1    Number of Visits  16    Date for PT Re-Evaluation  08/04/18    Authorization Type  Medicaid    PT Start Time  1010    PT Stop Time  1112    PT Time Calculation (min)  62 min    Activity Tolerance  Patient tolerated treatment well    Behavior During Therapy  WFL for tasks assessed/performed       Past Medical History:  Diagnosis Date  . ADHD (attention deficit hyperactivity disorder)   . Anxiety   . Anxiety and depression    mainly anxiety, Dr. Westley Chandler  . Anxiety state, unspecified 08/16/2013  . Arthritis    "right knee" (06/17/2017)  . Cholelithiasis 05/2013  . Chronic lower back pain   . Depression   . GERD (gastroesophageal reflux disease)    chronic gastritis on EGD 12/2012  . History of chicken pox   . Hyperlipidemia   . Hypertension   . Insomnia   . Migraine    "stopped after neck OR in 2012" (06/17/2017)  . OSA (obstructive sleep apnea)    "can't tolerate CPAP" (06/17/2017)  . Seasonal allergies   . Spinal headache    "related to epidural for c-section & after myelogram" (06/17/2017)    Past Surgical History:  Procedure Laterality Date  . ANTERIOR CERVICAL DECOMP/DISCECTOMY FUSION  05/03/2010   for herniated disc  . BACK SURGERY    . CESAREAN SECTION  2003  . CHOLECYSTECTOMY N/A 07/04/2013   LAPAROSCOPIC CHOLECYSTECTOMY WITH INTRAOPERATIVE CHOLANGIOGRAM;  Surgeon: Robyne Askew, MD  . ESOPHAGOGASTRODUODENOSCOPY  12/2012   mild chronic gastritis, rec dexilant Bing Matter)  . INTRAUTERINE DEVICE (IUD) INSERTION  ~ 2014  . JOINT REPLACEMENT    . REDUCTION MAMMAPLASTY  Bilateral 12/2010  . TOTAL KNEE ARTHROPLASTY Right 06/16/2017   Procedure: TOTAL KNEE ARTHROPLASTY;  Surgeon: Marcene Corning, MD;  Location: [facility] OR;  Service: Orthopedics;  Laterality: Right;  . WISDOM TOOTH EXTRACTION  1998    There were no vitals filed for this visit.   Subjective Assessment - 06/09/18 1017    Subjective  Pt reporting ongoing stiffness in R knee s/p R TKR on 06/16/2017. Pt states MD referrred her back to PT due to lack of full extenson ROM. Knee "aggravates" her with daily activities and still cause her to limp when walking. Reports more stiffness and mild soreness than pain at present.    Pertinent History  R TKR 5/72019    Limitations  Walking;House hold activities    How long can you stand comfortably?  15 minutes    How long can you walk comfortably?  15 minutes    Patient Stated Goals  "to get my leg straight"    Currently in Pain?  Yes    Pain Score  6    "Stiffness"   Pain Location  Knee    Pain Orientation  Right;Anterior    Pain Descriptors / Indicators  Tightness   "Stiffness"   Pain Type  Chronic pain    Pain Radiating Towards  n/a  Pain Onset  Other (comment)   06/2017   Pain Frequency  Intermittent    Aggravating Factors   climbing stairs    Pain Relieving Factors  rest    Effect of Pain on Daily Activities  limps when walking, limits tolerance for climbing stairs         [facility] PT Assessment - 06/09/18 1010      Assessment   Medical Diagnosis  R knee stiffness s/p R TKR    Referring Provider (PT)  Lubertha Basque. Dalldorf, MD    Onset Date/Surgical Date  06/16/17    Next MD Visit  3 months    Prior Therapy  8 visits in 2019 post-op from R TKR      Balance Screen   Has the patient fallen in the past 6 months  No    Has the patient had a decrease in activity level because of a fear of falling?   No    Is the patient reluctant to leave their home because of a fear of falling?   No      Home Nurse, mental health  Private residence     Living Arrangements  Children    Available Help at Discharge  Family    Type of Home  Apartment    Home Access  Stairs to enter    Entrance Stairs-Number of Steps  21    Entrance Stairs-Rails  Right;Left;Can reach both    Home Layout  One level      Prior Function   Level of Independence  Independent    Vocation  Unemployed    Vocation Requirements  looking for job related to criminal justice degree - i.e. helping people suffering from domestic violence    Leisure  shopping; was going to gym ~3 days/wk prior to COVID-19 shut-down      Cognition   Overall Cognitive Status  Within Functional Limits for tasks assessed      ROM / Strength   AROM / PROM / Strength  AROM;Strength      AROM   AROM Assessment Site  Knee    Right/Left Knee  Right;Left    Right Knee Extension  14    Right Knee Flexion  105    Left Knee Extension  -6   hyperextension   Left Knee Flexion  139      Strength   Strength Assessment Site  Hip;Knee    Right/Left Hip  Right;Left    Right Hip Flexion  4+/5    Right Hip Extension  4-/5    Right Hip ABduction  4-/5    Right Hip ADduction  4/5    Left Hip Flexion  5/5    Left Hip Extension  4/5    Left Hip ABduction  4+/5    Left Hip ADduction  4+/5    Right/Left Knee  Right;Left    Right Knee Flexion  4+/5    Right Knee Extension  4+/5    Left Knee Flexion  5/5    Left Knee Extension  5/5      Flexibility   Soft Tissue Assessment /Muscle Length  yes    Hamstrings  mod tightness R    Quadriceps  mod tightness hip flexors & quads R, hip flexors L    ITB  mild/mod tightness R    Piriformis  mild tightness R      Ambulation/Gait   Assistive device  None    Gait Pattern  Step-through  pattern;Decreased weight shift to right;Right flexed knee in stance;Decreased stride length    Ambulation Surface  Level;Indoor    Gait velocity  decreased                Objective measurements completed on examination: See above findings.      [facility] Adult  PT Treatment/Exercise - 06/09/18 1010      Exercises   Exercises  Knee/Hip      Knee/Hip Exercises: Stretches   Passive Hamstring Stretch  Right;30 seconds;1 rep    Passive Hamstring Stretch Limitations  supine with strap + DF for gastroc stretch    Hip Flexor Stretch  Right;30 seconds;1 rep    Hip Flexor Stretch Limitations  mod thomas with strap    ITB Stretch  Right;30 seconds;1 rep    ITB Stretch Limitations  supine with strap    Piriformis Stretch  Right;30 seconds;1 rep    Piriformis Stretch Limitations  KTOS    Gastroc Stretch  Right;30 seconds;1 rep    Gastroc Stretch Limitations  standing at wall      Knee/Hip Exercises: Supine   Heel Prop for Knee Extension Weight (lbs)  Pt reporting she has been performing seated heel prop at home - confrmed proper alignment and placement of added pressure.    Patellar Mobs  Instructed pt in self patellar mobs in all directions in long-sitting.    Knee Extension  Left      Knee/Hip Exercises: Prone   Prone Knee Hang Limitations  Pt reporting she has been performing seated heel prop at home - confrmed proper alignment and placement of added pressure.             PT Education - 06/09/18 1112    Education provided  Yes    Education Details  PT eval findings, anticipated POC, initial HEP and review of prolonged low-load static stretches for knee extension    Person(s) Educated  Patient    Methods  Explanation;Demonstration;Verbal cues;Tactile cues;Handout    Comprehension  Verbalized understanding;Returned demonstration;Verbal cues required;Tactile cues required;Need further instruction       PT Short Term Goals - 06/09/18 1112      PT SHORT TERM GOAL #1   Title  Independent with initial HEP    Baseline  Initial HEP provided on eval    Status  New    Target Date  06/23/18      PT SHORT TERM GOAL #2   Title  Patient will demonstrate improved heel strike and heel-toe gait progression to promote functional R knee extension     Baseline  R knee flexed with decreased heel strike and heel-toe progression creating decreased stride length and limp with gait as of eval on 06/09/18    Status  New    Target Date  06/23/18      PT SHORT TERM GOAL #3   Title  Patient will reduce R knee flexion contracture to </= 10 dg    Baseline  14 dg flexion contracture as of 06/09/18    Status  New    Target Date  06/23/18        PT Long Term Goals - 06/09/18 1112      PT LONG TERM GOAL #1   Title  Independent with advanced HEP for R knee ROM and strength    Status  New    Target Date  08/04/18      PT LONG TERM GOAL #2   Title  Improved R knee ROM  to >/= 5-115 dg to normalize gait and function.    Baseline  14-105 dg as of 06/09/18    Status  New    Target Date  08/04/18      PT LONG TERM GOAL #3   Title  R hip and knee strength >/= 4+/5 for improved activity tolerance    Baseline  refer to flowsheet    Status  New    Target Date  08/04/18      PT LONG TERM GOAL #4   Title  Patient able to ambulate community distances with normalized gait pattern    Baseline  R knee flexed with decreased heel strike and heel-toe progression creating decreased stride length and limp with gait as of eval on 06/09/18    Status  New    Target Date  08/04/18      PT LONG TERM GOAL #5   Title  Patient able to climb stairs with a symmetrical reciprocal gait pattern    Status  New    Target Date  08/04/18             Plan - 06/09/18 1121    Clinical Impression Statement  Kinga is a 53 y/o female who presents to [facility] PT with R knee stiffness and flexion contracture following R TKR on 06/16/2017. Patient reports minimal to no pain at this point, rather c/o "stiffness" and intermittent muscular soreness. R knee AROM currently 14-105 dg as compared to 18-102 dg as of last post-op therapy visit on 09/14/17, at which time the patient reports she was told by MD to take a break from PT. Mild to moderate decreased proximal LE flexibility evident on R >  L, with severely decreased R patellar mobility in all planes. Mild to moderate B hip and R knee weakness also present. Gait pattern reveals R knee flexed in stance resulting in decreased heel strike and heel-toe progression and decreased stride length creating a limp on the R. Masayo will benefit from skilled PT intervention to address the above listed deficits to allow for improved R knee ROM to promote normalization of gait pattern and  to maximize function and mobility. She may also benefit from a JAS Dynamic Knee extension brace to reduce her R knee flexion contracture - will investigate Medicaid coverage for bracing.    Personal Factors and Comorbidities  Time since onset of injury/illness/exacerbation;Comorbidity 3+;Past/Current Experience    Comorbidities  h/o neck and back surgery; chronic back pain; anxiety and depression; HTN    Examination-Activity Limitations  Bend;Locomotion Level;Stand;Squat    Examination-Participation Restrictions  Community Activity    Stability/Clinical Decision Making  Evolving/Moderate complexity    Clinical Decision Making  Moderate    Rehab Potential  Good    PT Frequency  2x / week    PT Duration  8 weeks    PT Treatment/Interventions  Patient/family education;Therapeutic exercise;Neuromuscular re-education;Manual techniques;Joint Manipulations;Dry needling;Passive range of motion;Taping;Splinting;Therapeutic activities;Gait training;Stair training;Electrical Stimulation;Cryotherapy;Vasopneumatic Device;Moist Heat;Iontophoresis /ml Dexamethasone;ADLs/Self Care Home Management    PT Next Visit Plan  R knee ROM & LE flexibility; quad strengthening to promote knee extension; manual therapy; gait training to normalize gait pattern    PT Home Exercise Plan  HS, ITB, gastroc & hip flexor srtretches; patellar mobs; prolonged low-load static stretching for knee extension    Consulted and Agree with Plan of Care  Patient       Patient will benefit from skilled  therapeutic intervention in order to improve the following deficits and impairments:  Decreased range of motion, Impaired flexibility, Increased muscle spasms, Decreased strength, Increased edema, Abnormal gait, Difficulty walking, Pain, Decreased activity tolerance  Visit Diagnosis: Stiffness of right knee, not elsewhere classified  Other abnormalities of gait and mobility  Muscle weakness (generalized)  Localized edema  Chronic pain of right knee     Problem List Patient Active Problem List   Diagnosis Date Noted  . Primary osteoarthritis of right knee 06/16/2017  . Right knee pain 11/03/2013  . Cholecystitis with cholelithiasis 07/04/2013  . Chronic cholecystitis with calculus 06/10/2013  . Healthcare maintenance 05/28/2011  . Abdominal cramping 05/28/2011  . OSA (obstructive sleep apnea)   . Anxiety and depression   . GERD (gastroesophageal reflux disease)   . Seasonal allergies   . Hx of migraine headaches 11/05/2010  . HYPERTENSION, BENIGN 10/04/2008  . Hyperlipidemia 09/29/2008  . HEADACHE 09/29/2008    Marry GuanJoAnne M Treniya Lobb, PT, MPT 06/09/2018, 12:44 PM  [facility] [place] [facility] [address]  [address] [place], [place], [address] Phone: [phone]   Fax:  [phone]  Name: Shelly Brown MRN: [id number] Date of Birth: 03/24/1965

## 2018-06-15 ENCOUNTER — Other Ambulatory Visit: Payer: Self-pay

## 2018-06-15 ENCOUNTER — Ambulatory Visit: Payer: Medicaid Other | Attending: Orthopaedic Surgery | Admitting: Physical Therapy

## 2018-06-15 DIAGNOSIS — M25661 Stiffness of right knee, not elsewhere classified: Secondary | ICD-10-CM | POA: Insufficient documentation

## 2018-06-15 DIAGNOSIS — R2689 Other abnormalities of gait and mobility: Secondary | ICD-10-CM

## 2018-06-15 DIAGNOSIS — R600 Localized edema: Secondary | ICD-10-CM

## 2018-06-15 DIAGNOSIS — M25561 Pain in right knee: Secondary | ICD-10-CM | POA: Insufficient documentation

## 2018-06-15 DIAGNOSIS — G8929 Other chronic pain: Secondary | ICD-10-CM

## 2018-06-15 DIAGNOSIS — M6281 Muscle weakness (generalized): Secondary | ICD-10-CM | POA: Diagnosis present

## 2018-06-15 NOTE — Patient Instructions (Signed)
c 

## 2018-06-15 NOTE — Therapy (Signed)
[facility] [address]  [address] [place], [place], [address] Phone: [phone]   Fax:  [phone]  Physical Therapy Treatment  Patient Details  Name: Shelly Brown MRN: [id number] Date of Birth: 05/25/1965 Referring Provider (PT): Lubertha BasquePeter G. Jerl Santosalldorf, MD   Encounter Date: 06/15/2018  PT End of Session - 06/15/18 1016    Visit Number  2    Number of Visits  16    Date for PT Re-Evaluation  08/04/18    Authorization Type  Medicaid    Authorization Time Period  06/15/18 - 06/22/18    Authorization - Visit Number  1    Authorization - Number of Visits  3    PT Start Time  1016    PT Stop Time  1101    PT Time Calculation (min)  45 min    Activity Tolerance  Patient tolerated treatment well    Behavior During Therapy  [facility]LP[facility] for tasks assessed/performed       Past Medical History:  Diagnosis Date  . ADHD (attention deficit hyperactivity disorder)   . Anxiety   . Anxiety and depression    mainly anxiety, Dr. Westley ChandlerKarr  . Anxiety state, unspecified 08/16/2013  . Arthritis    "right knee" (06/17/2017)  . Cholelithiasis 05/2013  . Chronic lower back pain   . Depression   . GERD (gastroesophageal reflux disease)    chronic gastritis on EGD 12/2012  . History of chicken pox   . Hyperlipidemia   . Hypertension   . Insomnia   . Migraine    "stopped after neck OR in 2012" (06/17/2017)  . OSA (obstructive sleep apnea)    "can't tolerate CPAP" (06/17/2017)  . Seasonal allergies   . Spinal headache    "related to epidural for c-section & after myelogram" (06/17/2017)    Past Surgical History:  Procedure Laterality Date  . ANTERIOR CERVICAL DECOMP/DISCECTOMY FUSION  05/03/2010   for herniated disc  . BACK SURGERY    . CESAREAN SECTION  2003  . CHOLECYSTECTOMY N/A 07/04/2013   LAPAROSCOPIC CHOLECYSTECTOMY WITH INTRAOPERATIVE CHOLANGIOGRAM;  Surgeon: Robyne AskewPaul S Toth III, MD  . ESOPHAGOGASTRODUODENOSCOPY  12/2012   mild chronic gastritis, rec  dexilant Bing Matter(Ganem Eagle)  . INTRAUTERINE DEVICE (IUD) INSERTION  ~ 2014  . JOINT REPLACEMENT    . REDUCTION MAMMAPLASTY Bilateral 12/2010  . TOTAL KNEE ARTHROPLASTY Right 06/16/2017   Procedure: TOTAL KNEE ARTHROPLASTY;  Surgeon: Marcene Corningalldorf, Peter, MD;  Location: [facility] OR;  Service: Orthopedics;  Laterality: Right;  . WISDOM TOOTH EXTRACTION  1998    There were no vitals filed for this visit.  Subjective Assessment - 06/15/18 1020    Subjective  Pt reporting no concerns with initial HEP and feels like her kneecap might be moving a little better.    Pertinent History  R TKR 5/72019    Limitations  Walking;House hold activities    Patient Stated Goals  "to get my leg straight"    Currently in Pain?  Yes    Pain Score  3     Pain Location  Knee   "kneecap"   Pain Orientation  Right;Anterior    Pain Descriptors / Indicators  Tightness   "pulling"   Pain Type  Chronic pain    Pain Frequency  Intermittent                       [facility] Adult PT Treatment/Exercise - 06/15/18 1016  Exercises   Exercises  Knee/Hip      Knee/Hip Exercises: Stretches   Passive Hamstring Stretch  Right;30 seconds;2 reps    Passive Hamstring Stretch Limitations  supine with strap + DF for gastroc stretch    Hip Flexor Stretch  Right;30 seconds;2 reps    Hip Flexor Stretch Limitations  mod thomas with strap    ITB Stretch  Right;30 seconds;2 reps    ITB Stretch Limitations  supine with strap      Knee/Hip Exercises: Aerobic   Recumbent Bike  L2 x 6 min      Knee/Hip Exercises: Standing   Hip Flexion  Right;10 reps;Knee straight;Stengthening    Hip Flexion Limitations  red TB - cues for quad set to maintain knee extension; 1 pole A    Terminal Knee Extension  Right;15 reps;Theraband;Strengthening    Theraband Level (Terminal Knee Extension)  Level 4 (Blue)    Terminal Knee Extension Limitations  5 sec hold - cues for quad & glute activation, pushing heel down into floor    Hip ADduction   Right;10 reps;Strengthening    Hip ADduction Limitations  red TB - cues for quad set to maintain knee extension; 1 pole A    Hip Abduction  Right;10 reps;Knee straight;Stengthening    Abduction Limitations  red TB - cues for quad set to maintain knee extension; 1 pole A    Hip Extension  Right;10 reps;Knee straight;Stengthening    Extension Limitations  red TB - cues for quad set to maintain knee extension; 1 pole A      Manual Therapy   Manual Therapy  Joint mobilization;Soft tissue mobilization;Myofascial release;Other (comment)    Manual therapy comments  supine & hook lying    Joint Mobilization  R knee patellar mobs - all directions; Grade II-III R femur on tibia A/P mobs with knee extended for increased extension, tibia on femur A/P mobs with knee flexed for increased flexion ROM    Soft tissue mobilization  STM/DTM to R quads, HS and ITB with strumming to ITB    Myofascial Release  pin and stretch to R ITB and lateral quads/HS    Other Manual Therapy  Instructed pt in use of rolling pin for self-STM to quads, ITB, HS and calf.             PT Education - 06/15/18 1100    Education provided  Yes    Education Details  HEP update - hip and knee strengthening focusing on quad activation for knee extension    Person(s) Educated  Patient    Methods  Explanation;Demonstration;Verbal cues;Handout    Comprehension  Verbalized understanding;Returned demonstration;Verbal cues required;Need further instruction       PT Short Term Goals - 06/15/18 1022      PT SHORT TERM GOAL #1   Title  Independent with initial HEP    Baseline  Initial HEP provided on eval    Status  On-going    Target Date  06/23/18      PT SHORT TERM GOAL #2   Title  Patient will demonstrate improved heel strike and heel-toe gait progression to promote functional R knee extension    Baseline  R knee flexed with decreased heel strike and heel-toe progression creating decreased stride length and limp with gait as  of eval on 06/09/18    Status  On-going    Target Date  06/23/18      PT SHORT TERM GOAL #3   Title  Patient  will reduce R knee flexion contracture to </= 10 dg    Baseline  14 dg flexion contracture as of 06/09/18    Status  On-going    Target Date  06/23/18        PT Long Term Goals - 06/15/18 1022      PT LONG TERM GOAL #1   Title  Independent with advanced HEP for R knee ROM and strength    Status  On-going    Target Date  08/04/18      PT LONG TERM GOAL #2   Title  Improved R knee ROM to >/= 5-115 dg to normalize gait and function.    Baseline  14-105 dg as of 06/09/18    Status  On-going    Target Date  08/04/18      PT LONG TERM GOAL #3   Title  R hip and knee strength >/= 4+/5 for improved activity tolerance    Baseline  refer to flowsheet    Status  On-going    Target Date  08/04/18      PT LONG TERM GOAL #4   Title  Patient able to ambulate community distances with normalized gait pattern    Baseline  R knee flexed with decreased heel strike and heel-toe progression creating decreased stride length and limp with gait as of eval on 06/09/18    Status  On-going    Target Date  08/04/18      PT LONG TERM GOAL #5   Title  Patient able to climb stairs with a symmetrical reciprocal gait pattern    Status  On-going    Target Date  08/04/18            Plan - 06/15/18 1023    Clinical Impression Statement  French Ana reporting good compliance with HEP with no concerns and feels that patellar mobility may be improving. Review of initial HEP requiring minor clarifications of proper positioning and hip/knee alignment during stretches but otherwise good return demonstration, Lateral patellar mobility improving but still significantly limited with superior/inferior mobility. Manual therapy today focusing on R knee patellar mobilty as well as femorotibial joint mobs to promote improved knee flexion and extension ROM along with STM/MFR to decrease soft tissue restriction with ROM.  Therapeutic exercises targeting active knee extension with cues provided as indicated to maximize quad activation. HEP updated to include self-STM with rolling pin as well as strengthening exercises targeting knee extension ROM.    Comorbidities  h/o neck and back surgery; chronic back pain; anxiety and depression; HTN    Rehab Potential  Good    PT Treatment/Interventions  Patient/family education;Therapeutic exercise;Neuromuscular re-education;Manual techniques;Joint Manipulations;Dry needling;Passive range of motion;Taping;Splinting;Therapeutic activities;Gait training;Stair training;Electrical Stimulation;Cryotherapy;Vasopneumatic Device;Moist Heat;Iontophoresis /ml Dexamethasone;ADLs/Self Care Home Management    PT Next Visit Plan  R knee ROM & LE flexibility; quad strengthening to promote knee extension; manual therapy; gait training to normalize gait pattern    PT Home Exercise Plan  HS, ITB, gastroc & hip flexor srtretches; patellar mobs; prolonged low-load static stretching for knee extension    Consulted and Agree with Plan of Care  Patient       Patient will benefit from skilled therapeutic intervention in order to improve the following deficits and impairments:  Decreased range of motion, Impaired flexibility, Increased muscle spasms, Decreased strength, Increased edema, Abnormal gait, Difficulty walking, Pain, Decreased activity tolerance  Visit Diagnosis: Stiffness of right knee, not elsewhere classified  Other abnormalities of gait and mobility  Muscle weakness (  generalized)  Localized edema  Chronic pain of right knee     Problem List Patient Active Problem List   Diagnosis Date Noted  . Primary osteoarthritis of right knee 06/16/2017  . Right knee pain 11/03/2013  . Cholecystitis with cholelithiasis 07/04/2013  . Chronic cholecystitis with calculus 06/10/2013  . Healthcare maintenance 05/28/2011  . Abdominal cramping 05/28/2011  . OSA (obstructive sleep apnea)    . Anxiety and depression   . GERD (gastroesophageal reflux disease)   . Seasonal allergies   . Hx of migraine headaches 11/05/2010  . HYPERTENSION, BENIGN 10/04/2008  . Hyperlipidemia 09/29/2008  . HEADACHE 09/29/2008    Marry Guan, PT, MPT 06/15/2018, 1:09 PM  [facility] [address]  [address] [place], [place], [address] Phone: [phone]   Fax:  [phone]  Name: Shelly Brown MRN: [id number] Date of Birth: 1965/08/15

## 2018-06-18 ENCOUNTER — Encounter: Payer: Self-pay | Admitting: Physical Therapy

## 2018-06-18 ENCOUNTER — Other Ambulatory Visit: Payer: Self-pay

## 2018-06-18 ENCOUNTER — Ambulatory Visit: Payer: Medicaid Other | Admitting: Physical Therapy

## 2018-06-18 DIAGNOSIS — R600 Localized edema: Secondary | ICD-10-CM

## 2018-06-18 DIAGNOSIS — M25661 Stiffness of right knee, not elsewhere classified: Secondary | ICD-10-CM

## 2018-06-18 DIAGNOSIS — G8929 Other chronic pain: Secondary | ICD-10-CM

## 2018-06-18 DIAGNOSIS — M6281 Muscle weakness (generalized): Secondary | ICD-10-CM

## 2018-06-18 DIAGNOSIS — R2689 Other abnormalities of gait and mobility: Secondary | ICD-10-CM

## 2018-06-18 NOTE — Therapy (Signed)
[facility] [address]  [address] [place], [place], [address] Phone: [phone]   Fax:  [phone]  Physical Therapy Treatment  Patient Details  Name: Margarito Courserracy Solar MRN: [id number] Date of Birth: 23-Sep-1965 Referring Provider (PT): Lubertha BasquePeter G. Jerl Santosalldorf, MD   Encounter Date: 06/18/2018  PT End of Session - 06/18/18 1014    Visit Number  3    Number of Visits  16    Date for PT Re-Evaluation  08/04/18    Authorization Type  Medicaid    Authorization Time Period  06/15/18 - 06/22/18    Authorization - Visit Number  2    Authorization - Number of Visits  3    PT Start Time  1014    PT Stop Time  1100    PT Time Calculation (min)  46 min    Activity Tolerance  Patient tolerated treatment well    Behavior During Therapy  WFL for tasks assessed/performed       Past Medical History:  Diagnosis Date  . ADHD (attention deficit hyperactivity disorder)   . Anxiety   . Anxiety and depression    mainly anxiety, Dr. Westley ChandlerKarr  . Anxiety state, unspecified 08/16/2013  . Arthritis    "right knee" (06/17/2017)  . Cholelithiasis 05/2013  . Chronic lower back pain   . Depression   . GERD (gastroesophageal reflux disease)    chronic gastritis on EGD 12/2012  . History of chicken pox   . Hyperlipidemia   . Hypertension   . Insomnia   . Migraine    "stopped after neck OR in 2012" (06/17/2017)  . OSA (obstructive sleep apnea)    "can't tolerate CPAP" (06/17/2017)  . Seasonal allergies   . Spinal headache    "related to epidural for c-section & after myelogram" (06/17/2017)    Past Surgical History:  Procedure Laterality Date  . ANTERIOR CERVICAL DECOMP/DISCECTOMY FUSION  05/03/2010   for herniated disc  . BACK SURGERY    . CESAREAN SECTION  2003  . CHOLECYSTECTOMY N/A 07/04/2013   LAPAROSCOPIC CHOLECYSTECTOMY WITH INTRAOPERATIVE CHOLANGIOGRAM;  Surgeon: Robyne AskewPaul S Toth III, MD  . ESOPHAGOGASTRODUODENOSCOPY  12/2012   mild chronic gastritis, rec  dexilant Bing Matter(Ganem Eagle)  . INTRAUTERINE DEVICE (IUD) INSERTION  ~ 2014  . JOINT REPLACEMENT    . REDUCTION MAMMAPLASTY Bilateral 12/2010  . TOTAL KNEE ARTHROPLASTY Right 06/16/2017   Procedure: TOTAL KNEE ARTHROPLASTY;  Surgeon: Marcene Corningalldorf, Peter, MD;  Location: [facility];  Service: Orthopedics;  Laterality: Right;  . WISDOM TOOTH EXTRACTION  1998    There were no vitals filed for this visit.  Subjective Assessment - 06/18/18 1020    Subjective  Pt reporting increased stiffness this morning.    Pertinent History  R TKR 5/72019    Limitations  Walking;House hold activities    Patient Stated Goals  "to get my leg straight"    Currently in Pain?  Yes    Pain Score  4     Pain Location  Knee    Pain Orientation  Right;Anterior    Pain Descriptors / Indicators  Sore;Tightness    Pain Type  Chronic pain    Pain Frequency  Intermittent         [facility] PT Assessment - 06/18/18 1014      AROM   Right Knee Extension  11    Right Knee Flexion  110      PROM   Right Knee Extension  8  with PT distraction after joint mobs                  [facility] Adult PT Treatment/Exercise - 06/18/18 1014      Ambulation/Gait   Ambulation/Gait Assistance  6: Modified independent (Device/Increase time)    Ambulation/Gait Assistance Details  Provided cues for normalized gait pattern focusing on increased hip and knee flexion during swing through, progressing to knee extension on heel strike with good heel-toe follow through as well as narrowing BOS.    Ambulation Distance (Feet)  450 Feet    Assistive device  None    Gait Pattern  Step-through pattern;Right flexed knee in stance;Decreased stride length;Decreased hip/knee flexion - right;Decreased hip/knee flexion - left;Wide base of support    Ambulation Surface  Level;Indoor    Gait Comments  Patient initially exaggerating gait corrections but able to eventually incorporate movement patterns into more fluid step pattern with decreasing gait  deviation.      Exercises   Exercises  Knee/Hip      Knee/Hip Exercises: Aerobic   Recumbent Bike  L2 x 6 min      Manual Therapy   Manual Therapy  Joint mobilization;Soft tissue mobilization;Myofascial release;Other (comment)    Manual therapy comments  supine     Joint Mobilization  R knee patellar mobs - all directions; Grade II-III R femur on tibia A/P mobs with knee extended and slight distraction at ankle for increased extension    Soft tissue mobilization  STM/DTM to R quads, HS and ITB with strumming to ITB - improving muscle tension relative to last visit    Myofascial Release  pin and stretch to R ITB and lateral quads/HS               PT Short Term Goals - 06/15/18 1022      PT SHORT TERM GOAL #1   Title  Independent with initial HEP    Baseline  Initial HEP provided on eval    Status  On-going    Target Date  06/23/18      PT SHORT TERM GOAL #2   Title  Patient will demonstrate improved heel strike and heel-toe gait progression to promote functional R knee extension    Baseline  R knee flexed with decreased heel strike and heel-toe progression creating decreased stride length and limp with gait as of eval on 06/09/18    Status  On-going    Target Date  06/23/18      PT SHORT TERM GOAL #3   Title  Patient will reduce R knee flexion contracture to </= 10 dg    Baseline  14 dg flexion contracture as of 06/09/18    Status  On-going    Target Date  06/23/18        PT Long Term Goals - 06/15/18 1022      PT LONG TERM GOAL #1   Title  Independent with advanced HEP for R knee ROM and strength    Status  On-going    Target Date  08/04/18      PT LONG TERM GOAL #2   Title  Improved R knee ROM to >/= 5-115 dg to normalize gait and function.    Baseline  14-105 dg as of 06/09/18    Status  On-going    Target Date  08/04/18      PT LONG TERM GOAL #3   Title  R hip and knee strength >/= 4+/5 for improved activity tolerance  Baseline  refer to flowsheet     Status  On-going    Target Date  08/04/18      PT LONG TERM GOAL #4   Title  Patient able to ambulate community distances with normalized gait pattern    Baseline  R knee flexed with decreased heel strike and heel-toe progression creating decreased stride length and limp with gait as of eval on 06/09/18    Status  On-going    Target Date  08/04/18      PT LONG TERM GOAL #5   Title  Patient able to climb stairs with a symmetrical reciprocal gait pattern    Status  On-going    Target Date  08/04/18            Plan - 06/18/18 1023    Clinical Impression Statement  French Ana reporting increased stiffness/tightness today which she attribute to the colder temps, but notes improvement following warm up on recumbent bike. Patient reporting no concerns with HEP and already demonstrating good initial progress with R knee AROM - now 11-110 dgs as compared to 14-105 dg on eval. Treatment session including measurement for JAS knee extension brace as well as focus on manual therapy to increase R knee extension and gait training to normalize gait pattern. Patient initially exaggerating gait corrections but able to eventually incorporate movement patterns into more fluid step pattern with decreasing gait deviation.    Comorbidities  h/o neck and back surgery; chronic back pain; anxiety and depression; HTN    Rehab Potential  Good    PT Treatment/Interventions  Patient/family education;Therapeutic exercise;Neuromuscular re-education;Manual techniques;Joint Manipulations;Dry needling;Passive range of motion;Taping;Splinting;Therapeutic activities;Gait training;Stair training;Electrical Stimulation;Cryotherapy;Vasopneumatic Device;Moist Heat;Iontophoresis /ml Dexamethasone;ADLs/Self Care Home Management    PT Next Visit Plan  R knee ROM & LE flexibility; quad strengthening to promote knee extension; manual therapy; gait training to normalize gait pattern    PT Home Exercise Plan  HS, ITB, gastroc & hip flexor  srtretches; patellar mobs; prolonged low-load static stretching for knee extension    Consulted and Agree with Plan of Care  Patient       Patient will benefit from skilled therapeutic intervention in order to improve the following deficits and impairments:  Decreased range of motion, Impaired flexibility, Increased muscle spasms, Decreased strength, Increased edema, Abnormal gait, Difficulty walking, Pain, Decreased activity tolerance  Visit Diagnosis: Stiffness of right knee, not elsewhere classified  Other abnormalities of gait and mobility  Muscle weakness (generalized)  Localized edema  Chronic pain of right knee     Problem List Patient Active Problem List   Diagnosis Date Noted  . Primary osteoarthritis of right knee 06/16/2017  . Right knee pain 11/03/2013  . Cholecystitis with cholelithiasis 07/04/2013  . Chronic cholecystitis with calculus 06/10/2013  . Healthcare maintenance 05/28/2011  . Abdominal cramping 05/28/2011  . OSA (obstructive sleep apnea)   . Anxiety and depression   . GERD (gastroesophageal reflux disease)   . Seasonal allergies   . Hx of migraine headaches 11/05/2010  . HYPERTENSION, BENIGN 10/04/2008  . Hyperlipidemia 09/29/2008  . HEADACHE 09/29/2008    Marry Guan, PT, MPT 06/18/2018, 1:39 PM  [facility] [address]  [address] [place], [place], [address] Phone: [phone]   Fax:  [phone]  Name: Alexia Dinger MRN: [id number] Date of Birth: 10-31-1965

## 2018-06-22 ENCOUNTER — Ambulatory Visit: Payer: Medicaid Other | Admitting: Physical Therapy

## 2018-06-22 ENCOUNTER — Other Ambulatory Visit: Payer: Self-pay

## 2018-06-22 ENCOUNTER — Encounter: Payer: Self-pay | Admitting: Physical Therapy

## 2018-06-22 DIAGNOSIS — M6281 Muscle weakness (generalized): Secondary | ICD-10-CM

## 2018-06-22 DIAGNOSIS — G8929 Other chronic pain: Secondary | ICD-10-CM

## 2018-06-22 DIAGNOSIS — R2689 Other abnormalities of gait and mobility: Secondary | ICD-10-CM

## 2018-06-22 DIAGNOSIS — M25661 Stiffness of right knee, not elsewhere classified: Secondary | ICD-10-CM | POA: Diagnosis not present

## 2018-06-22 DIAGNOSIS — R600 Localized edema: Secondary | ICD-10-CM

## 2018-06-22 NOTE — Therapy (Signed)
[facility] [address]  [place], [place], [address] Phone: [phone]   Fax:  [phone]  Physical Therapy Treatment  Patient Details  Name: Shelly Brown MRN: [id number] Date of Birth: 03/10/1965 Referring Provider (PT): Monico Blitz. Rhona Raider, MD   Encounter Date: 06/22/2018  PT End of Session - 06/22/18 1020    Visit Number  4    Number of Visits  16    Date for PT Re-Evaluation  08/04/18    Authorization Type  Medicaid    Authorization Time Period  06/15/18 - 06/22/18    Authorization - Visit Number  3    Authorization - Number of Visits  3    PT Start Time  1020    PT Stop Time  1104    PT Time Calculation (min)  44 min    Activity Tolerance  Patient tolerated treatment well    Behavior During Therapy  WFL for tasks assessed/performed       Past Medical History:  Diagnosis Date  . ADHD (attention deficit hyperactivity disorder)   . Anxiety   . Anxiety and depression    mainly anxiety, Dr. Wylene Simmer  . Anxiety state, unspecified 08/16/2013  . Arthritis    "right knee" (06/17/2017)  . Cholelithiasis 05/2013  . Chronic lower back pain   . Depression   . GERD (gastroesophageal reflux disease)    chronic gastritis on EGD 12/2012  . History of chicken pox   . Hyperlipidemia   . Hypertension   . Insomnia   . Migraine    "stopped after neck OR in 2012" (06/17/2017)  . OSA (obstructive sleep apnea)    "can'Shelly tolerate CPAP" (06/17/2017)  . Seasonal allergies   . Spinal headache    "related to epidural for c-section & after myelogram" (06/17/2017)    Past Surgical History:  Procedure Laterality Date  . ANTERIOR CERVICAL DECOMP/DISCECTOMY FUSION  05/03/2010   for herniated disc  . BACK SURGERY    . CESAREAN SECTION  2003  . CHOLECYSTECTOMY N/A 07/04/2013   LAPAROSCOPIC CHOLECYSTECTOMY WITH INTRAOPERATIVE CHOLANGIOGRAM;  Surgeon: Merrie Roof, MD  . ESOPHAGOGASTRODUODENOSCOPY  12/2012   mild chronic gastritis, rec  dexilant Jessie Foot)  . INTRAUTERINE DEVICE (IUD) INSERTION  ~ 2014  . JOINT REPLACEMENT    . REDUCTION MAMMAPLASTY Bilateral 12/2010  . TOTAL KNEE ARTHROPLASTY Right 06/16/2017   Procedure: TOTAL KNEE ARTHROPLASTY;  Surgeon: Melrose Nakayama, MD;  Location: [facility];  Service: Orthopedics;  Laterality: Right;  . WISDOM TOOTH EXTRACTION  1998    There were no vitals filed for this visit.  Subjective Assessment - 06/22/18 1030    Subjective  Pt denies pain, noting only stiffness due to the cold this am.    Pertinent History  R TKR 5/72019    Limitations  Walking;House hold activities    Patient Stated Goals  "to get my leg straight"    Currently in Pain?  No/denies         [facility] PT Assessment - 06/22/18 1020      Assessment   Medical Diagnosis  R knee stiffness s/p R TKR    Referring Provider (PT)  Monico Blitz. Rhona Raider, MD    Onset Date/Surgical Date  06/16/17    Next MD Visit  3 months      AROM   Right Knee Extension  9    Right Knee Flexion  112      Ambulation/Gait   Ambulation/Gait  Assistance  6: Modified independent (Device/Increase time)    Ambulation Distance (Feet)  180 Feet    Assistive device  None    Gait Pattern  Step-through pattern;Right flexed knee in stance;Decreased stride length;Wide base of support   BOS narrowing from previous visits   Ambulation Surface  Level;Indoor    Gait Comments  Gait pattern improving with narrowing BOS and more consistent heel strike with good heel-toe progression. Still with slight knee flexion in stance due to ongoing knee flexion contracture but lessened from eval.                   [facility] Adult PT Treatment/Exercise - 06/22/18 0001      Knee/Hip Exercises: Aerobic   Recumbent Bike  L2 x 6 min      Knee/Hip Exercises: Standing   Heel Raises  Both;15 reps;3 seconds    Heel Raises Limitations  cues for quad & glute set emphasizing knee extension    Lateral Step Up  Right;15 reps;Step Height: 8";Hand Hold: 1     Lateral Step Up Limitations  cues for TKE on R before placing L foot on step    Forward Step Up  Right;15 reps;Step Height: 8";Hand Hold: 1    Forward Step Up Limitations  cues for TKE on R before placing L foot on step    Functional Squat  15 reps;3 seconds    Functional Squat Limitations  counter squat - cues to avoid knees in front of toes and to maximize knee extension on return to stand      Knee/Hip Exercises: Supine   Bridges  Both;10 reps;Strengthening    Bridges Limitations  + hip ABD isometric with green TB    Bridges with Clamshell  Both;10 reps;Strengthening   alt hip ABD/ER clam with green TB              PT Short Term Goals - 06/22/18 1030      PT SHORT TERM GOAL #1   Title  Independent with initial HEP    Status  Achieved      PT SHORT TERM GOAL #2   Title  Patient will demonstrate improved heel strike and heel-toe gait progression to promote functional R knee extension    Status  Achieved      PT SHORT TERM GOAL #3   Title  Patient will reduce R knee flexion contracture to </= 10 dg    Status  Achieved        PT Long Term Goals - 06/22/18 1104      PT LONG TERM GOAL #1   Title  Independent with advanced HEP for R knee ROM and strength    Status  On-going    Target Date  08/04/18      PT LONG TERM GOAL #2   Title  Improved R knee ROM to >/= 5-120 dg to normalize gait and function.    Baseline  9-112 sg as of 06/22/18; 14-105 dg as of 06/09/18    Status  Revised    Target Date  08/04/18      PT LONG TERM GOAL #3   Title  R hip and knee strength >/= 4+/5 for improved activity tolerance    Baseline  refer to flowsheet    Status  On-going    Target Date  08/04/18      PT LONG TERM GOAL #4   Title  Patient able to ambulate community distances with normalized gait pattern  Baseline  R knee flexed with decreased heel strike and heel-toe progression creating decreased stride length and limp with gait as of eval on 06/09/18    Status  On-going     Target Date  08/04/18      PT LONG TERM GOAL #5   Title  Patient able to climb stairs with a symmetrical reciprocal gait pattern    Status  On-going    Target Date  08/04/18            Plan - 06/22/18 1033    Clinical Impression Statement  Shelly Brown has demonstrated excellent initial progress with PT one year s/p R TKR. R knee AROM has improved from 14-105 dg to 9-112 dg in just 3 treatment visits. Gait pattern improving with narrowing BOS and more consistent heel strike with good heel-toe progression. Still with slight knee flexion in stance due to ongoing knee flexion contracture but lessened from eval. Patient reports good understanding and compliance with HEP and denies need for review, therefore able to continue exercise progression targeting strengthening to further facilitate AROM into extension with good tolerance reported. All STGs met at this time, with Shelly Brown demonstrating good potential for further gains with PT and good likelihood to achieve all LTGs.    Comorbidities  h/o neck and back surgery; chronic back pain; anxiety and depression; HTN    Rehab Potential  Good    PT Treatment/Interventions  Patient/family education;Therapeutic exercise;Neuromuscular re-education;Manual techniques;Joint Manipulations;Dry needling;Passive range of motion;Taping;Splinting;Therapeutic activities;Gait training;Stair training;Electrical Stimulation;Cryotherapy;Vasopneumatic Device;Moist Heat;Iontophoresis 32m/ml Dexamethasone;ADLs/Self Care Home Management    PT Next Visit Plan  R knee ROM & LE flexibility; quad strengthening to promote knee extension; manual therapy; gait training to normalize gait pattern    PT Home Exercise Plan  HS, ITB, gastroc & hip flexor srtretches; patellar mobs; prolonged low-load static stretching for knee extension    Consulted and Agree with Plan of Care  Patient       Patient will benefit from skilled therapeutic intervention in order to improve the following deficits  and impairments:  Decreased range of motion, Impaired flexibility, Increased muscle spasms, Decreased strength, Increased edema, Abnormal gait, Difficulty walking, Pain, Decreased activity tolerance  Visit Diagnosis: Stiffness of right knee, not elsewhere classified  Other abnormalities of gait and mobility  Muscle weakness (generalized)  Localized edema  Chronic pain of right knee     Problem List Patient Active Problem List   Diagnosis Date Noted  . Primary osteoarthritis of right knee 06/16/2017  . Right knee pain 11/03/2013  . Cholecystitis with cholelithiasis 07/04/2013  . Chronic cholecystitis with calculus 06/10/2013  . Healthcare maintenance 05/28/2011  . Abdominal cramping 05/28/2011  . OSA (obstructive sleep apnea)   . Anxiety and depression   . GERD (gastroesophageal reflux disease)   . Seasonal allergies   . Hx of migraine headaches 11/05/2010  . HYPERTENSION, BENIGN 10/04/2008  . Hyperlipidemia 09/29/2008  . HEADACHE 09/29/2008    JPercival Spanish PT, MPT 06/22/2018, 4:32 PM  [facility] [place] [address]Phone: [phone]  Fax:  [phone] Name: TShaneal BaraschMRN: [id number] of Birth: [birth year]

## 2018-06-25 ENCOUNTER — Ambulatory Visit: Payer: Medicaid Other | Admitting: Physical Therapy

## 2018-06-29 ENCOUNTER — Encounter: Payer: Self-pay | Admitting: Physical Therapy

## 2018-06-29 ENCOUNTER — Other Ambulatory Visit: Payer: Self-pay

## 2018-06-29 ENCOUNTER — Ambulatory Visit: Payer: Medicaid Other | Admitting: Physical Therapy

## 2018-06-29 DIAGNOSIS — R600 Localized edema: Secondary | ICD-10-CM

## 2018-06-29 DIAGNOSIS — G8929 Other chronic pain: Secondary | ICD-10-CM

## 2018-06-29 DIAGNOSIS — M25661 Stiffness of right knee, not elsewhere classified: Secondary | ICD-10-CM

## 2018-06-29 DIAGNOSIS — M6281 Muscle weakness (generalized): Secondary | ICD-10-CM

## 2018-06-29 DIAGNOSIS — R2689 Other abnormalities of gait and mobility: Secondary | ICD-10-CM

## 2018-06-29 NOTE — Therapy (Signed)
[facility] [address]  [address] [place], [place], [address] Phone: [phone]   Fax:  [phone]  Physical Therapy Treatment  Patient Details  Name: Shelly Brown Schranz MRN: [id number] Date of Birth: Jun 13, 1965 Referring Provider (PT): Lubertha BasquePeter G. Jerl Santosalldorf, MD   Encounter Date: 06/29/2018  PT End of Session - 06/29/18 1002    Visit Number  5    Number of Visits  16    Date for PT Re-Evaluation  08/04/18    Authorization Type  Medicaid    Authorization Time Period  06/25/18 - 08/05/18    Authorization - Visit Number  1    Authorization - Number of Visits  12    PT Start Time  1002    PT Stop Time  1050    PT Time Calculation (min)  48 min    Activity Tolerance  Patient tolerated treatment well    Behavior During Therapy  WFL for tasks assessed/performed       Past Medical History:  Diagnosis Date  . ADHD (attention deficit hyperactivity disorder)   . Anxiety   . Anxiety and depression    mainly anxiety, Dr. Westley ChandlerKarr  . Anxiety state, unspecified 08/16/2013  . Arthritis    "right knee" (06/17/2017)  . Cholelithiasis 05/2013  . Chronic lower back pain   . Depression   . GERD (gastroesophageal reflux disease)    chronic gastritis on EGD 12/2012  . History of chicken pox   . Hyperlipidemia   . Hypertension   . Insomnia   . Migraine    "stopped after neck OR in 2012" (06/17/2017)  . OSA (obstructive sleep apnea)    "can't tolerate CPAP" (06/17/2017)  . Seasonal allergies   . Spinal headache    "related to epidural for c-section & after myelogram" (06/17/2017)    Past Surgical History:  Procedure Laterality Date  . ANTERIOR CERVICAL DECOMP/DISCECTOMY FUSION  05/03/2010   for herniated disc  . BACK SURGERY    . CESAREAN SECTION  2003  . CHOLECYSTECTOMY N/A 07/04/2013   LAPAROSCOPIC CHOLECYSTECTOMY WITH INTRAOPERATIVE CHOLANGIOGRAM;  Surgeon: Robyne AskewPaul S Toth III, MD  . ESOPHAGOGASTRODUODENOSCOPY  12/2012   mild chronic gastritis,  rec dexilant Bing Matter(Ganem Eagle)  . INTRAUTERINE DEVICE (IUD) INSERTION  ~ 2014  . JOINT REPLACEMENT    . REDUCTION MAMMAPLASTY Bilateral 12/2010  . TOTAL KNEE ARTHROPLASTY Right 06/16/2017   Procedure: TOTAL KNEE ARTHROPLASTY;  Surgeon: Marcene Corningalldorf, Peter, MD;  Location: [facility];  Service: Orthopedics;  Laterality: Right;  . WISDOM TOOTH EXTRACTION  1998    There were no vitals filed for this visit.  Subjective Assessment - 06/29/18 1006    Subjective  No pain this morning, only tightness. Reports she went to the beach over the weekend and was able to walk in that sand w/o any problems.    Pertinent History  R TKR 5/72019    Limitations  Walking;House hold activities    Patient Stated Goals  "to get my leg straight"    Currently in Pain?  No/denies         [facility] - 06/29/18 1002      AROM   Right Knee Extension  7    Right Knee Flexion  114                   [facility] Adult PT Treatment/Exercise - 06/29/18 1002      Exercises   Exercises  Knee/Hip  Knee/Hip Exercises: Stretches   Theme park manager  Right;30 seconds;3 reps    Buyer, retail      Knee/Hip Exercises: Aerobic   Recumbent Bike  L2 x 6 min      Knee/Hip Exercises: Machines for Strengthening   Cybex Knee Extension  B con/R ecc 25# x 15    Cybex Knee Flexion  B con/R ecc 35# x 15    Cybex Leg Press  B LE 25# x 15 (focusing on full R knee extension); R LE 20# x 15      Knee/Hip Exercises: Standing   Forward Step Up  Right;15 reps;Step Height: 8";Hand Hold: 1    Forward Step Up Limitations  + blue TB TKE; cues for full TKE on R before placing L foot on step    Step Down  Right;15 reps;Step Height: 6";Hand Hold: 2    Step Down Limitations  eccentric lowering with light heel touch    Other Standing Knee Exercises  B side stepping and fwd/back monster walk with looped red TB at ankles 2 x 25 ft      Manual Therapy   Manual Therapy  Joint mobilization;Soft tissue  mobilization;Other (comment)    Joint Mobilization  R knee patellar mobs    Other Manual Therapy  reviewed use of rolling pin for self-STM to ITB and HS               PT Short Term Goals - 06/22/18 1030      PT SHORT TERM GOAL #1   Title  Independent with initial HEP    Status  Achieved      PT SHORT TERM GOAL #2   Title  Patient will demonstrate improved heel strike and heel-toe gait progression to promote functional R knee extension    Status  Achieved      PT SHORT TERM GOAL #3   Title  Patient will reduce R knee flexion contracture to </= 10 dg    Status  Achieved        PT Long Term Goals - 06/22/18 1104      PT LONG TERM GOAL #1   Title  Independent with advanced HEP for R knee ROM and strength    Status  On-going    Target Date  08/04/18      PT LONG TERM GOAL #2   Title  Improved R knee ROM to >/= 5-120 dg to normalize gait and function.    Baseline  9-112 sg as of 06/22/18; 14-105 dg as of 06/09/18    Status  Revised    Target Date  08/04/18      PT LONG TERM GOAL #3   Title  R hip and knee strength >/= 4+/5 for improved activity tolerance    Baseline  refer to flowsheet    Status  On-going    Target Date  08/04/18      PT LONG TERM GOAL #4   Title  Patient able to ambulate community distances with normalized gait pattern    Baseline  R knee flexed with decreased heel strike and heel-toe progression creating decreased stride length and limp with gait as of eval on 06/09/18    Status  On-going    Target Date  08/04/18      PT LONG TERM GOAL #5   Title  Patient able to climb stairs with a symmetrical reciprocal gait pattern    Status  On-going    Target Date  08/04/18            Plan - 06/29/18 1007    Clinical Impression Statement  R knee ROM continues to improve: 7-114 dg today, however ongoing hamstring and calf tightness continue to limit ROM therefore reviewed stretching and self-STM with rolling pin. Continued to emphasize quad activation  for full active knee extension during exercises with improving control noted. All info submitted for JAS extension brace and pt reporting she received a voicemail from the vendor and plans to return the call today.    Comorbidities  h/o neck and back surgery; chronic back pain; anxiety and depression; HTN    Rehab Potential  Good    PT Treatment/Interventions  Patient/family education;Therapeutic exercise;Neuromuscular re-education;Manual techniques;Joint Manipulations;Dry needling;Passive range of motion;Taping;Splinting;Therapeutic activities;Gait training;Stair training;Electrical Stimulation;Cryotherapy;Vasopneumatic Device;Moist Heat;Iontophoresis 4mg /ml Dexamethasone;ADLs/Self Care Home Management    PT Next Visit Plan  R knee ROM & LE flexibility; quad strengthening to promote knee extension; manual therapy; gait training to normalize gait pattern    PT Home Exercise Plan  HS, ITB, gastroc & hip flexor strretches; patellar mobs; prolonged low-load static stretching for knee extension    Consulted and Agree with Plan of Care  Patient       Patient will benefit from skilled therapeutic intervention in order to improve the following deficits and impairments:  Decreased range of motion, Impaired flexibility, Increased muscle spasms, Decreased strength, Increased edema, Abnormal gait, Difficulty walking, Pain, Decreased activity tolerance  Visit Diagnosis: Stiffness of right knee, not elsewhere classified  Other abnormalities of gait and mobility  Muscle weakness (generalized)  Localized edema  Chronic pain of right knee     Problem List Patient Active Problem List   Diagnosis Date Noted  . Primary osteoarthritis of right knee 06/16/2017  . Right knee pain 11/03/2013  . Cholecystitis with cholelithiasis 07/04/2013  . Chronic cholecystitis with calculus 06/10/2013  . Healthcare maintenance 05/28/2011  . Abdominal cramping 05/28/2011  . OSA (obstructive sleep apnea)   . Anxiety  and depression   . GERD (gastroesophageal reflux disease)   . Seasonal allergies   . Hx of migraine headaches 11/05/2010  . HYPERTENSION, BENIGN 10/04/2008  . Hyperlipidemia 09/29/2008  . HEADACHE 09/29/2008    Marry Guan, PT, MPT 06/29/2018, 11:00 AM  [facility] [address]  [address] [place], [place], [address] Phone: [phone]   Fax:  [phone]  Name: Atley Cardone MRN: [id number] Date of Birth: August 09, 1965

## 2018-07-01 ENCOUNTER — Encounter: Payer: Self-pay | Admitting: Physical Therapy

## 2018-07-01 ENCOUNTER — Other Ambulatory Visit: Payer: Self-pay

## 2018-07-01 ENCOUNTER — Ambulatory Visit: Payer: Medicaid Other | Admitting: Physical Therapy

## 2018-07-01 DIAGNOSIS — R600 Localized edema: Secondary | ICD-10-CM

## 2018-07-01 DIAGNOSIS — G8929 Other chronic pain: Secondary | ICD-10-CM

## 2018-07-01 DIAGNOSIS — M25661 Stiffness of right knee, not elsewhere classified: Secondary | ICD-10-CM

## 2018-07-01 DIAGNOSIS — M6281 Muscle weakness (generalized): Secondary | ICD-10-CM

## 2018-07-01 DIAGNOSIS — R2689 Other abnormalities of gait and mobility: Secondary | ICD-10-CM

## 2018-07-01 NOTE — Therapy (Signed)
[facility] [address]  [place], [place], [address] Phone: [phone]   Fax:  [phone]  Physical Therapy Treatment  Patient Details  Name: Shelly Brown MRN: [id number] Date of Birth: [birth year] Referring Provider (PT): Monico Blitz. Rhona Raider, MD   Encounter Date: 07/01/2018  PT End of Session - 07/01/18 0959    Visit Number  6    Number of Visits  16    Date for PT Re-Evaluation  08/04/18    Authorization Type  Medicaid    Authorization Time Period  06/25/18 - 08/05/18    Authorization - Visit Number  2    Authorization - Number of Visits  12    PT Start Time  0959    PT Stop Time  6203    PT Time Calculation (min)  48 min    Activity Tolerance  Patient tolerated treatment well    Behavior During Therapy  [facility] for tasks assessed/performed       Past Medical History:  Diagnosis Date  . ADHD (attention deficit hyperactivity disorder)   . Anxiety   . Anxiety and depression    mainly anxiety, Dr. Wylene Simmer  . Anxiety state, unspecified 08/16/2013  . Arthritis    "right knee" (06/17/2017)  . Cholelithiasis 05/2013  . Chronic lower back pain   . Depression   . GERD (gastroesophageal reflux disease)    chronic gastritis on EGD 12/2012  . History of chicken pox   . Hyperlipidemia   . Hypertension   . Insomnia   . Migraine    "stopped after neck OR in 2012" (06/17/2017)  . OSA (obstructive sleep apnea)    "can't tolerate CPAP" (06/17/2017)  . Seasonal allergies   . Spinal headache    "related to epidural for c-section & after myelogram" (06/17/2017)    Past Surgical History:  Procedure Laterality Date  . ANTERIOR CERVICAL DECOMP/DISCECTOMY FUSION  05/03/2010   for herniated disc  . BACK SURGERY    . CESAREAN SECTION  2003  . CHOLECYSTECTOMY N/A 07/04/2013   LAPAROSCOPIC CHOLECYSTECTOMY WITH INTRAOPERATIVE CHOLANGIOGRAM;  Surgeon: Merrie Roof, MD  . ESOPHAGOGASTRODUODENOSCOPY  12/2012   mild chronic gastritis,  rec dexilant Jessie Foot)  . INTRAUTERINE DEVICE (IUD) INSERTION  ~ 2014  . JOINT REPLACEMENT    . REDUCTION MAMMAPLASTY Bilateral 12/2010  . TOTAL KNEE ARTHROPLASTY Right 06/16/2017   Procedure: TOTAL KNEE ARTHROPLASTY;  Surgeon: Melrose Nakayama, MD;  Location: [facility];  Service: Orthopedics;  Laterality: Right;  . WISDOM TOOTH EXTRACTION  1998    There were no vitals filed for this visit.  Subjective Assessment - 07/01/18 1003    Subjective  Pt reporting slight knee pain (1/10) with knee bending (flexion) today - thinks it's weather related - but otherwise denies pain. Reports she tried to return call re: JAS brace but phone number she was given was a fax number - patient given phone number for Ple[employer].    Pertinent History  R TKR 5/72019    Limitations  Walking;House hold activities    Patient Stated Goals  "to get my leg straight"    Currently in Pain?  Yes    Pain Score  1     Pain Location  Knee    Pain Orientation  Right;Anterior    Pain Descriptors / Indicators  Sore    Pain Type  Chronic pain  [facility] Adult PT Treatment/Exercise - 07/01/18 0959      Exercises   Exercises  Knee/Hip      Knee/Hip Exercises: Aerobic   Recumbent Bike  L2 x 6 min      Knee/Hip Exercises: Standing   Forward Lunges  Right;Left;10 reps;3 seconds    Forward Lunges Limitations  UE support on counter    Side Lunges  Right;Left;10 reps;3 seconds    Side Lunges Limitations  TRX    Terminal Knee Extension  Right;20 reps;Theraband;Strengthening    Theraband Level (Terminal Knee Extension)  --   Black   Terminal Knee Extension Limitations  5 sec hold - cues for quad & glute activation, pushing heel down into floor    Functional Squat  10 reps;5 seconds;2 sets    Functional Squat Limitations  TRX squat - 2nd set with triple extension      Knee/Hip Exercises: Supine   Bridges  Both;10 reps;Strengthening    Bridges Limitations  + alt knee extension    Straight Leg  Raises  Right;15 reps;Strengthening    Straight Leg Raises Limitations  2# - cues for quad set prior to initiation of lift    Knee Extension  Right;10 reps;Strengthening   5 sec hold   Knee Extension Limitations  quad/glute set with heels pressing into peanut ball    Knee Flexion  Right;AROM;AAROM;10 reps   5 sec hold   Knee Flexion Limitations  HS curl with heels on penut ball    Other Supine Knee/Hip Exercises  B straight leg bridge into peanut ball 10 x 5 sec (2 sets) - 2nd set + alt SLR lift-off from peanut ball    Other Supine Knee/Hip Exercises  Bridge + HS curl with heels on peanut ball x 10      Knee/Hip Exercises: Prone   Prone Knee Hang  4 minutes    Prone Knee Hang Limitations  PT performing manual STM to R HS, contract/relax to R HS 3 cycles, and gentle overpressure 2 x 30 sec      Manual Therapy   Manual Therapy  Soft tissue mobilization;Myofascial release;Muscle Energy Technique    Manual therapy comments  R knee in prone hang position    Soft tissue mobilization  R distal HS with & w/o roller stick    Myofascial Release  manual MFR/TPR to R medial HS    Muscle Energy Technique  contract/relax to R HS on prong hang position             PT Education - 07/01/18 1045    Education provided  Yes    Education Details  HEP progression - black TB for TKE, counter squat progressed to triple extension    Person(s) Educated  Patient    Methods  Explanation;Demonstration    Comprehension  Verbalized understanding;Returned demonstration       PT Short Term Goals - 06/22/18 1030      PT SHORT TERM GOAL #1   Title  Independent with initial HEP    Status  Achieved      PT SHORT TERM GOAL #2   Title  Patient will demonstrate improved heel strike and heel-toe gait progression to promote functional R knee extension    Status  Achieved      PT SHORT TERM GOAL #3   Title  Patient will reduce R knee flexion contracture to </= 10 dg    Status  Achieved        PT Long  Term  Goals - 06/22/18 1104      PT LONG TERM GOAL #1   Title  Independent with advanced HEP for R knee ROM and strength    Status  On-going    Target Date  08/04/18      PT LONG TERM GOAL #2   Title  Improved R knee ROM to >/= 5-120 dg to normalize gait and function.    Baseline  9-112 sg as of 06/22/18; 14-105 dg as of 06/09/18    Status  Revised    Target Date  08/04/18      PT LONG TERM GOAL #3   Title  R hip and knee strength >/= 4+/5 for improved activity tolerance    Baseline  refer to flowsheet    Status  On-going    Target Date  08/04/18      PT LONG TERM GOAL #4   Title  Patient able to ambulate community distances with normalized gait pattern    Baseline  R knee flexed with decreased heel strike and heel-toe progression creating decreased stride length and limp with gait as of eval on 06/09/18    Status  On-going    Target Date  08/04/18      PT LONG TERM GOAL #5   Title  Patient able to climb stairs with a symmetrical reciprocal gait pattern    Status  On-going    Target Date  08/04/18            Plan - 07/01/18 1005    Clinical Impression Statement  Good tolerance for exercise progression emphasizing end range R knee flexion & extension ROM during strengthening exercises. Manual therapy performed in prone hang position today targeting R HS muscle tension with STM, manual stretching and MET to promoted improved R knee extension ROM.    Comorbidities  h/o neck and back surgery; chronic back pain; anxiety and depression; HTN    Rehab Potential  Good    PT Treatment/Interventions  Patient/family education;Therapeutic exercise;Neuromuscular re-education;Manual techniques;Joint Manipulations;Dry needling;Passive range of motion;Taping;Splinting;Therapeutic activities;Gait training;Stair training;Electrical Stimulation;Cryotherapy;Vasopneumatic Device;Moist Heat;Iontophoresis 109m/ml Dexamethasone;ADLs/Self Care Home Management    PT Next Visit Plan  R knee ROM & LE  flexibility; quad strengthening to promote knee extension; manual therapy; gait training to normalize gait pattern    PT Home Exercise Plan  HS, ITB, gastroc & hip flexor strretches; patellar mobs; prolonged low-load static stretching for knee extension    Consulted and Agree with Plan of Care  Patient       Patient will benefit from skilled therapeutic intervention in order to improve the following deficits and impairments:  Decreased range of motion, Impaired flexibility, Increased muscle spasms, Decreased strength, Increased edema, Abnormal gait, Difficulty walking, Pain, Decreased activity tolerance  Visit Diagnosis: Stiffness of right knee, not elsewhere classified  Other abnormalities of gait and mobility  Muscle weakness (generalized)  Localized edema  Chronic pain of right knee     Problem List Patient Active Problem List   Diagnosis Date Noted  . Primary osteoarthritis of right knee 06/16/2017  . Right knee pain 11/03/2013  . Cholecystitis with cholelithiasis 07/04/2013  . Chronic cholecystitis with calculus 06/10/2013  . Healthcare maintenance 05/28/2011  . Abdominal cramping 05/28/2011  . OSA (obstructive sleep apnea)   . Anxiety and depression   . GERD (gastroesophageal reflux disease)   . Seasonal allergies   . Hx of migraine headaches 11/05/2010  . HYPERTENSION, BENIGN 10/04/2008  . Hyperlipidemia 09/29/2008  . HEADACHE 09/29/2008  Percival Spanish, PT, MPT 07/01/2018, 12:27 PM  [facility] [address]  [place], [place], [address] Phone: [phone]   Fax:  [phone]  Name: Shelly Brown MRN: [id number] Date of Birth: 1965-12-18

## 2018-07-06 ENCOUNTER — Ambulatory Visit: Payer: Medicaid Other | Admitting: Physical Therapy

## 2018-07-08 ENCOUNTER — Ambulatory Visit: Payer: Medicaid Other | Admitting: Physical Therapy

## 2018-07-08 ENCOUNTER — Other Ambulatory Visit: Payer: Self-pay

## 2018-07-08 ENCOUNTER — Encounter: Payer: Self-pay | Admitting: Physical Therapy

## 2018-07-08 DIAGNOSIS — G8929 Other chronic pain: Secondary | ICD-10-CM

## 2018-07-08 DIAGNOSIS — M25561 Pain in right knee: Secondary | ICD-10-CM

## 2018-07-08 DIAGNOSIS — R600 Localized edema: Secondary | ICD-10-CM

## 2018-07-08 DIAGNOSIS — R2689 Other abnormalities of gait and mobility: Secondary | ICD-10-CM

## 2018-07-08 DIAGNOSIS — M25661 Stiffness of right knee, not elsewhere classified: Secondary | ICD-10-CM | POA: Diagnosis not present

## 2018-07-08 DIAGNOSIS — M6281 Muscle weakness (generalized): Secondary | ICD-10-CM

## 2018-07-08 NOTE — Therapy (Signed)
[facility] [address]  [address] [place], [place], [address] Phone: [phone]   Fax:  [phone]  Physical Therapy Treatment  Patient Details  Name: Shelly Brown MRN: [id number] Date of Birth: 1965/05/26 Referring Provider (PT): Lubertha Basque. Jerl Santos, MD   Encounter Date: 07/08/2018  PT End of Session - 07/08/18 1000    Visit Number  7    Number of Visits  16    Date for PT Re-Evaluation  08/04/18    Authorization Type  Medicaid    Authorization Time Period  06/25/18 - 08/05/18    Authorization - Visit Number  3    Authorization - Number of Visits  12    PT Start Time  1000    PT Stop Time  1044    PT Time Calculation (min)  44 min    Activity Tolerance  Patient tolerated treatment well    Behavior During Therapy  WFL for tasks assessed/performed       Past Medical History:  Diagnosis Date  . ADHD (attention deficit hyperactivity disorder)   . Anxiety   . Anxiety and depression    mainly anxiety, Dr. Westley Chandler  . Anxiety state, unspecified 08/16/2013  . Arthritis    "right knee" (06/17/2017)  . Cholelithiasis 05/2013  . Chronic lower back pain   . Depression   . GERD (gastroesophageal reflux disease)    chronic gastritis on EGD 12/2012  . History of chicken pox   . Hyperlipidemia   . Hypertension   . Insomnia   . Migraine    "stopped after neck OR in 2012" (06/17/2017)  . OSA (obstructive sleep apnea)    "can't tolerate CPAP" (06/17/2017)  . Seasonal allergies   . Spinal headache    "related to epidural for c-section & after myelogram" (06/17/2017)    Past Surgical History:  Procedure Laterality Date  . ANTERIOR CERVICAL DECOMP/DISCECTOMY FUSION  05/03/2010   for herniated disc  . BACK SURGERY    . CESAREAN SECTION  2003  . CHOLECYSTECTOMY N/A 07/04/2013   LAPAROSCOPIC CHOLECYSTECTOMY WITH INTRAOPERATIVE CHOLANGIOGRAM;  Surgeon: Robyne Askew, MD  . ESOPHAGOGASTRODUODENOSCOPY  12/2012   mild chronic gastritis,  rec dexilant Bing Matter)  . INTRAUTERINE DEVICE (IUD) INSERTION  ~ 2014  . JOINT REPLACEMENT    . REDUCTION MAMMAPLASTY Bilateral 12/2010  . TOTAL KNEE ARTHROPLASTY Right 06/16/2017   Procedure: TOTAL KNEE ARTHROPLASTY;  Surgeon: Marcene Corning, MD;  Location: [facility];  Service: Orthopedics;  Laterality: Right;  . WISDOM TOOTH EXTRACTION  1998    There were no vitals filed for this visit.  Subjective Assessment - 07/08/18 1003    Subjective  Pt noting occasional clicking/popping with some motions of R knee - not painful. Pt stating she will miss PT next week d/t going on vacation.    Pertinent History  R TKR 5/72019    Limitations  Walking;House hold activities    Patient Stated Goals  "to get my leg straight"    Currently in Pain?  No/denies                       [facility] Adult PT Treatment/Exercise - 07/08/18 1000      Exercises   Exercises  Knee/Hip      Knee/Hip Exercises: Aerobic   Recumbent Bike  L2 x 6 min      Knee/Hip Exercises: Standing   Hip Flexion  Right;10 reps;Knee straight;Stengthening  Hip Flexion Limitations  green TB - cues for quad set to maintain knee extension; 1 pole A    Hip ADduction  Right;10 reps;Strengthening    Hip ADduction Limitations  green TB - cues for quad set to maintain knee extension; 1 pole A    Hip Abduction  Right;10 reps;Knee straight;Stengthening    Abduction Limitations  green TB - cues for quad set to maintain knee extension; 1 pole A    Hip Extension  Right;10 reps;Knee straight;Stengthening    Extension Limitations  green TB - cues for quad set to maintain knee extension; 1 pole A    Other Standing Knee Exercises  B side stepping and fwd/back monster walk with looped red TB at ankles 2 x 30 ft      Knee/Hip Exercises: Seated   Long Arc Quad  Right;15 reps;Strengthening    Long Arc Quad Limitations  green TB    Hamstring Curl  Right;15 reps;Strengthening    Hamstring Limitations  green TB      Knee/Hip Exercises:  Sidelying   Clams  R/L clam with red TB at knees x 15             PT Education - 07/08/18 1044    Education provided  Yes    Education Details  HEP update - progression of standing 4-way SLR to green TB, addition of HS curl & LAQ with green TB, lateral & fwd/back monster walk with red TB, bridge + alt LAQ, clam with red TB    Person(s) Educated  Patient    Methods  Explanation;Demonstration;Handout    Comprehension  Verbalized understanding;Returned demonstration;Need further instruction       PT Short Term Goals - 06/22/18 1030      PT SHORT TERM GOAL #1   Title  Independent with initial HEP    Status  Achieved      PT SHORT TERM GOAL #2   Title  Patient will demonstrate improved heel strike and heel-toe gait progression to promote functional R knee extension    Status  Achieved      PT SHORT TERM GOAL #3   Title  Patient will reduce R knee flexion contracture to </= 10 dg    Status  Achieved        PT Long Term Goals - 06/22/18 1104      PT LONG TERM GOAL #1   Title  Independent with advanced HEP for R knee ROM and strength    Status  On-going    Target Date  08/04/18      PT LONG TERM GOAL #2   Title  Improved R knee ROM to >/= 5-120 dg to normalize gait and function.    Baseline  9-112 sg as of 06/22/18; 14-105 dg as of 06/09/18    Status  Revised    Target Date  08/04/18      PT LONG TERM GOAL #3   Title  R hip and knee strength >/= 4+/5 for improved activity tolerance    Baseline  refer to flowsheet    Status  On-going    Target Date  08/04/18      PT LONG TERM GOAL #4   Title  Patient able to ambulate community distances with normalized gait pattern    Baseline  R knee flexed with decreased heel strike and heel-toe progression creating decreased stride length and limp with gait as of eval on 06/09/18    Status  On-going  Target Date  08/04/18      PT LONG TERM GOAL #5   Title  Patient able to climb stairs with a symmetrical reciprocal gait pattern     Status  On-going    Target Date  08/04/18            Plan - 07/08/18 1005    Clinical Impression Statement  French Anaracy noting some increased muscle soreness the day after last therapy session but has been good since. HEP reviewed and updated as patient will be out of town on vacation next week - pt able to perform good return demonstration of all exercises. Continue to await Medicaid approval for JAS brace.    Comorbidities  h/o neck and back surgery; chronic back pain; anxiety and depression; HTN    Rehab Potential  Good    PT Treatment/Interventions  Patient/family education;Therapeutic exercise;Neuromuscular re-education;Manual techniques;Joint Manipulations;Dry needling;Passive range of motion;Taping;Splinting;Therapeutic activities;Gait training;Stair training;Electrical Stimulation;Cryotherapy;Vasopneumatic Device;Moist Heat;Iontophoresis 4mg /ml Dexamethasone;ADLs/Self Care Home Management    PT Next Visit Plan  R knee ROM & LE flexibility; quad strengthening to promote knee extension; manual therapy; gait training to normalize gait pattern    PT Home Exercise Plan  HS, ITB, gastroc & hip flexor strretches; patellar mobs; prolonged low-load static stretching for knee extension    Consulted and Agree with Plan of Care  Patient       Patient will benefit from skilled therapeutic intervention in order to improve the following deficits and impairments:  Decreased range of motion, Impaired flexibility, Increased muscle spasms, Decreased strength, Increased edema, Abnormal gait, Difficulty walking, Pain, Decreased activity tolerance  Visit Diagnosis: Stiffness of right knee, not elsewhere classified  Other abnormalities of gait and mobility  Muscle weakness (generalized)  Localized edema  Chronic pain of right knee     Problem List Patient Active Problem List   Diagnosis Date Noted  . Primary osteoarthritis of right knee 06/16/2017  . Right knee pain 11/03/2013  .  Cholecystitis with cholelithiasis 07/04/2013  . Chronic cholecystitis with calculus 06/10/2013  . Healthcare maintenance 05/28/2011  . Abdominal cramping 05/28/2011  . OSA (obstructive sleep apnea)   . Anxiety and depression   . GERD (gastroesophageal reflux disease)   . Seasonal allergies   . Hx of migraine headaches 11/05/2010  . HYPERTENSION, BENIGN 10/04/2008  . Hyperlipidemia 09/29/2008  . HEADACHE 09/29/2008    Marry GuanJoAnne M Jamieka Royle, PT, MPT 07/08/2018, 10:50 AM  [facility] [place] [facility] [address]  [address] [place], [place], [address] Phone: [phone]   Fax:  [phone]  Name: Shelly Courserracy Slawinski MRN: [id number] Date of Birth: 06-07-1965

## 2018-07-13 ENCOUNTER — Encounter: Payer: Medicaid Other | Admitting: Physical Therapy

## 2018-07-15 ENCOUNTER — Ambulatory Visit: Payer: Medicaid Other | Admitting: Physical Therapy

## 2018-07-20 ENCOUNTER — Other Ambulatory Visit: Payer: Self-pay

## 2018-07-20 ENCOUNTER — Ambulatory Visit: Payer: Medicaid Other | Attending: Orthopaedic Surgery | Admitting: Physical Therapy

## 2018-07-20 DIAGNOSIS — R2689 Other abnormalities of gait and mobility: Secondary | ICD-10-CM

## 2018-07-20 DIAGNOSIS — M6281 Muscle weakness (generalized): Secondary | ICD-10-CM

## 2018-07-20 DIAGNOSIS — G8929 Other chronic pain: Secondary | ICD-10-CM

## 2018-07-20 DIAGNOSIS — R600 Localized edema: Secondary | ICD-10-CM | POA: Diagnosis present

## 2018-07-20 DIAGNOSIS — M25561 Pain in right knee: Secondary | ICD-10-CM | POA: Diagnosis present

## 2018-07-20 DIAGNOSIS — M25661 Stiffness of right knee, not elsewhere classified: Secondary | ICD-10-CM

## 2018-07-20 NOTE — Therapy (Signed)
[facility] [address]  [place], [place], [address] Phone: [phone]   Fax:  [phone]  Physical Therapy Treatment  Patient Details  Name: Shelly Brown MRN: [id number] Date of Birth: 07/11/1965 Referring Provider (PT): Monico Blitz. Rhona Raider, MD   Encounter Date: 07/20/2018  PT End of Session - 07/20/18 1001    Visit Number  8    Number of Visits  16    Date for PT Re-Evaluation  08/04/18    Authorization Type  Medicaid    Authorization Time Period  06/25/18 - 08/05/18    Authorization - Visit Number  4    Authorization - Number of Visits  12    PT Start Time  1001    PT Stop Time  1113   13 minutes meeting with JAS rep for knee extension brace instruction   PT Time Calculation (min)  72 min    Activity Tolerance  Patient tolerated treatment well    Behavior During Therapy  WFL for tasks assessed/performed       Past Medical History:  Diagnosis Date  . ADHD (attention deficit hyperactivity disorder)   . Anxiety   . Anxiety and depression    mainly anxiety, Dr. Wylene Simmer  . Anxiety state, unspecified 08/16/2013  . Arthritis    "right knee" (06/17/2017)  . Cholelithiasis 05/2013  . Chronic lower back pain   . Depression   . GERD (gastroesophageal reflux disease)    chronic gastritis on EGD 12/2012  . History of chicken pox   . Hyperlipidemia   . Hypertension   . Insomnia   . Migraine    "stopped after neck OR in 2012" (06/17/2017)  . OSA (obstructive sleep apnea)    "can'Shelly tolerate CPAP" (06/17/2017)  . Seasonal allergies   . Spinal headache    "related to epidural for c-section & after myelogram" (06/17/2017)    Past Surgical History:  Procedure Laterality Date  . ANTERIOR CERVICAL DECOMP/DISCECTOMY FUSION  05/03/2010   for herniated disc  . BACK SURGERY    . CESAREAN SECTION  2003  . CHOLECYSTECTOMY N/A 07/04/2013   LAPAROSCOPIC CHOLECYSTECTOMY WITH INTRAOPERATIVE CHOLANGIOGRAM;  Surgeon: Merrie Roof, MD   . ESOPHAGOGASTRODUODENOSCOPY  12/2012   mild chronic gastritis, rec dexilant Jessie Foot)  . INTRAUTERINE DEVICE (IUD) INSERTION  ~ 2014  . JOINT REPLACEMENT    . REDUCTION MAMMAPLASTY Bilateral 12/2010  . TOTAL KNEE ARTHROPLASTY Right 06/16/2017   Procedure: TOTAL KNEE ARTHROPLASTY;  Surgeon: Melrose Nakayama, MD;  Location: [facility];  Service: Orthopedics;  Laterality: Right;  . WISDOM TOOTH EXTRACTION  1998    There were no vitals filed for this visit.  Subjective Assessment - 07/20/18 1003    Subjective  Pt reporting misstep getting out of a truck while at the beach last week - between this and walking on the beach last week, her knee has been more sore of late. Pt reporting rep for JAS brace will be meeting her here at 11:00 today to fit her for the JAS knee extension brace.    Pertinent History  R TKR 5/72019    Limitations  Walking;House hold activities    Patient Stated Goals  "to get my leg straight"    Currently in Pain?  Yes    Pain Score  2     Pain Location  Knee    Pain Orientation  Right    Pain Descriptors / Indicators  Tightness  Pain Type  Chronic pain    Pain Frequency  Intermittent         [facility] PT Assessment - 07/20/18 1001      AROM   Right Knee Extension  8    Right Knee Flexion  112      Strength   Right Hip Flexion  5/5    Right Hip Extension  4+/5    Right Hip ABduction  4/5    Right Hip ADduction  4+/5    Left Hip Flexion  5/5    Left Hip Extension  4+/5    Left Hip ABduction  4+/5    Left Hip ADduction  4+/5    Right Knee Flexion  4+/5    Right Knee Extension  5/5    Left Knee Flexion  5/5    Left Knee Extension  5/5                   [facility] Adult PT Treatment/Exercise - 07/20/18 1001      Ambulation/Gait   Ambulation/Gait Assistance  7: Independent    Ambulation Distance (Feet)  400 Feet    Gait Pattern  Step-through pattern;Right flexed knee in stance;Decreased stride length;Decreased hip/knee flexion - right;Wide base of  support   deficits more subtle   Ambulation Surface  Level;Indoor    Stairs  Yes    Stairs Assistance  6: Modified independent (Device/Increase time)    Stair Management Technique  One rail Right;Forwards    Number of Stairs  14   x2   Height of Stairs  7    Gait Comments  Pt demonstrating tendency for R hip hike on ascent when lifting R foot up onto step due to limited hip/knee flexion as well as L hip drop on descent due to limited R eccentric quad control.      Exercises   Exercises  Knee/Hip      Knee/Hip Exercises: Stretches   Passive Hamstring Stretch  Right;30 seconds;2 reps    Passive Hamstring Stretch Limitations  supine with strap + DF for gastroc stretch    ITB Stretch  Right;30 seconds;2 reps    ITB Stretch Limitations  supine with strap      Knee/Hip Exercises: Aerobic   Recumbent Bike  L3 x 6 min      Knee/Hip Exercises: Standing   Forward Step Up  Right;15 reps;Step Height: 8";Hand Hold: 1    Forward Step Up Limitations  emphasizing level pelvis and increased hip knee flexion for foot placement on step    Step Down  Right;15 reps;Step Height: 6";Step Height: 4"    Step Down Limitations  eccentric lowering with light heel touch - step lowered to 4" due to continued tendency for L hip drop    SLS with Vectors  R SLS on blue foam oval with 4 -way toe tap to balance pebbles x 10 - focusing on maintaining level pelvis and achieving reach with eccentric R knee flexion, 2 pole A for balance      Modalities   Modalities  Vasopneumatic      Vasopneumatic   Number Minutes Vasopneumatic   10 minutes    Vasopnuematic Location   Knee    Vasopneumatic Pressure  High    Vasopneumatic Temperature   34             PT Education - 07/20/18 1047    Education provided  Yes    Education Details  JAS rep met with patient  at end of session for instructions in application and wearing schedule for JAS knee extension brace.    Person(s) Educated  Patient    Methods   Explanation;Demonstration;Handout    Comprehension  Verbalized understanding;Returned demonstration       PT Short Term Goals - 06/22/18 1030      PT SHORT TERM GOAL #1   Title  Independent with initial HEP    Status  Achieved      PT SHORT TERM GOAL #2   Title  Patient will demonstrate improved heel strike and heel-toe gait progression to promote functional R knee extension    Status  Achieved      PT SHORT TERM GOAL #3   Title  Patient will reduce R knee flexion contracture to </= 10 dg    Status  Achieved        PT Long Term Goals - 07/20/18 1005      PT LONG TERM GOAL #1   Title  Independent with advanced HEP for R knee ROM and strength    Status  Partially Met      PT LONG TERM GOAL #2   Title  Improved R knee ROM to >/= 5-120 dg to normalize gait and function.    Baseline  8-112 dg as of 07/20/18; 9-112 dg as of 06/22/18; 14-105 dg as of 06/09/18    Status  On-going      PT LONG TERM GOAL #3   Title  R hip and knee strength >/= 4+/5 for improved activity tolerance    Baseline  refer to flowsheet    Status  Partially Met      PT LONG TERM GOAL #4   Title  Patient able to ambulate community distances with normalized gait pattern    Baseline  07/20/18 - Subtle decreased R hip and knee flexion during swing phase with R knee slightly flexed in stance due to ongoing flexion contracture and mildly wider BOS    Status  Partially Met      PT LONG TERM GOAL #5   Title  Patient able to climb stairs with a symmetrical reciprocal gait pattern    Baseline  07/20/18 - Reciprocal stair negotiation but R hip hike on for R foot clearance on stair ascent and L hip drop on stair descent due to limited R eccentric quad control    Status  Partially Met            Plan - 07/20/18 1005    Clinical Impression Statement  Shelly Brown reporting increased knee pain today and over past week since misstep while getting out of a pickup truck last week and a week of walking on the beach and getting  up/down from low beach chair. R knee AROM slightly decreased to 8-112 dg today secondary to this. Overall LE strength improving with R LE now more symmetrical with L. Functional limitations due to limited ROM and weakness still evident in stair negotiation as patient demonstrates tendency for R hip hike on for R foot clearance on stair ascent and L hip drop on stair descent due to limited R eccentric quad control. Therapeutic exercises today focusing on improving control of functional R knee flexion and extension with better quad control to allow for maintenance of level pelvis and more normal step pattern with gait and stair negotiation. JAS rep met with patient at end of session for instructions in application and wearing schedule for JAS knee extension brace.    Comorbidities  h/o  neck and back surgery; chronic back pain; anxiety and depression; HTN    Rehab Potential  Good    PT Treatment/Interventions  Patient/family education;Therapeutic exercise;Neuromuscular re-education;Manual techniques;Joint Manipulations;Dry needling;Passive range of motion;Taping;Splinting;Therapeutic activities;Gait training;Stair training;Electrical Stimulation;Cryotherapy;Vasopneumatic Device;Moist Heat;Iontophoresis 71m/ml Dexamethasone;ADLs/Self Care Home Management    PT Next Visit Plan  R knee ROM & LE flexibility; quad strengthening to promote knee extension; manual therapy; gait training to normalize gait pattern    PT Home Exercise Plan  HS, ITB, gastroc & hip flexor stretches; patellar mobs; prolonged low-load static stretching for knee extension    Consulted and Agree with Plan of Care  Patient       Patient will benefit from skilled therapeutic intervention in order to improve the following deficits and impairments:  Decreased range of motion, Impaired flexibility, Increased muscle spasms, Decreased strength, Increased edema, Abnormal gait, Difficulty walking, Pain, Decreased activity tolerance  Visit  Diagnosis: Stiffness of right knee, not elsewhere classified  Other abnormalities of gait and mobility  Muscle weakness (generalized)  Localized edema  Chronic pain of right knee     Problem List Patient Active Problem List   Diagnosis Date Noted  . Primary osteoarthritis of right knee 06/16/2017  . Right knee pain 11/03/2013  . Cholecystitis with cholelithiasis 07/04/2013  . Chronic cholecystitis with calculus 06/10/2013  . Healthcare maintenance 05/28/2011  . Abdominal cramping 05/28/2011  . OSA (obstructive sleep apnea)   . Anxiety and depression   . GERD (gastroesophageal reflux disease)   . Seasonal allergies   . Hx of migraine headaches 11/05/2010  . HYPERTENSION, BENIGN 10/04/2008  . Hyperlipidemia 09/29/2008  . HEADACHE 09/29/2008    JPercival Spanish PT, MPT 07/20/2018, 11:29 AM  [facility] [place] [place] [address]Phone: [phone]  Fax:  [phone] Name: TKae LaumanMRN: [id number] of Birth: [birth year]

## 2018-07-21 ENCOUNTER — Ambulatory Visit: Payer: Medicaid Other | Admitting: Physical Therapy

## 2018-07-21 ENCOUNTER — Encounter: Payer: Medicaid Other | Admitting: Physical Therapy

## 2018-07-22 ENCOUNTER — Other Ambulatory Visit: Payer: Self-pay

## 2018-07-22 ENCOUNTER — Ambulatory Visit: Payer: Medicaid Other | Admitting: Physical Therapy

## 2018-07-22 ENCOUNTER — Encounter: Payer: Self-pay | Admitting: Physical Therapy

## 2018-07-22 DIAGNOSIS — M25661 Stiffness of right knee, not elsewhere classified: Secondary | ICD-10-CM | POA: Diagnosis not present

## 2018-07-22 DIAGNOSIS — M6281 Muscle weakness (generalized): Secondary | ICD-10-CM

## 2018-07-22 DIAGNOSIS — G8929 Other chronic pain: Secondary | ICD-10-CM

## 2018-07-22 DIAGNOSIS — R600 Localized edema: Secondary | ICD-10-CM

## 2018-07-22 DIAGNOSIS — R2689 Other abnormalities of gait and mobility: Secondary | ICD-10-CM

## 2018-07-22 NOTE — Therapy (Signed)
[facility] [address]  [place], [place], [address] Phone: [phone]   Fax:  [phone]  Physical Therapy Treatment  Patient Details  Name: Shelly Brown MRN: [id number] Date of Birth: February 06, 1966 Referring Provider (PT): Monico Blitz. Rhona Raider, MD   Encounter Date: 07/22/2018  PT End of Session - 07/22/18 1105    Visit Number  9    Number of Visits  16    Date for PT Re-Evaluation  08/04/18    Authorization Type  Medicaid    Authorization Time Period  06/25/18 - 08/05/18    Authorization - Visit Number  5    Authorization - Number of Visits  12    PT Start Time  8309    PT Stop Time  1151    PT Time Calculation (min)  46 min    Activity Tolerance  Patient tolerated treatment well    Behavior During Therapy  WFL for tasks assessed/performed       Past Medical History:  Diagnosis Date  . ADHD (attention deficit hyperactivity disorder)   . Anxiety   . Anxiety and depression    mainly anxiety, Dr. Wylene Simmer  . Anxiety state, unspecified 08/16/2013  . Arthritis    "right knee" (06/17/2017)  . Cholelithiasis 05/2013  . Chronic lower back pain   . Depression   . GERD (gastroesophageal reflux disease)    chronic gastritis on EGD 12/2012  . History of chicken pox   . Hyperlipidemia   . Hypertension   . Insomnia   . Migraine    "stopped after neck OR in 2012" (06/17/2017)  . OSA (obstructive sleep apnea)    "can't tolerate CPAP" (06/17/2017)  . Seasonal allergies   . Spinal headache    "related to epidural for c-section & after myelogram" (06/17/2017)    Past Surgical History:  Procedure Laterality Date  . ANTERIOR CERVICAL DECOMP/DISCECTOMY FUSION  05/03/2010   for herniated disc  . BACK SURGERY    . CESAREAN SECTION  2003  . CHOLECYSTECTOMY N/A 07/04/2013   LAPAROSCOPIC CHOLECYSTECTOMY WITH INTRAOPERATIVE CHOLANGIOGRAM;  Surgeon: Merrie Roof, MD  . ESOPHAGOGASTRODUODENOSCOPY  12/2012   mild chronic gastritis,  rec dexilant Jessie Foot)  . INTRAUTERINE DEVICE (IUD) INSERTION  ~ 2014  . JOINT REPLACEMENT    . REDUCTION MAMMAPLASTY Bilateral 12/2010  . TOTAL KNEE ARTHROPLASTY Right 06/16/2017   Procedure: TOTAL KNEE ARTHROPLASTY;  Surgeon: Melrose Nakayama, MD;  Location: [facility];  Service: Orthopedics;  Laterality: Right;  . WISDOM TOOTH EXTRACTION  1998    There were no vitals filed for this visit.  Subjective Assessment - 07/22/18 1109    Subjective  Pt reporting limited tolerance for JAS dynamic extension brace, feeling like she can only manage brace for 20 minutes at a time with mild increased pain soreness since using brace.    Pertinent History  R TKR 06/16/2017    Limitations  Walking;House hold activities    Patient Stated Goals  "to get my leg straight"    Currently in Pain?  Yes    Pain Score  1     Pain Location  Knee    Pain Orientation  Right    Pain Descriptors / Indicators  Tightness    Pain Type  Acute pain;Chronic pain    Pain Frequency  Intermittent                       [facility] Adult  PT Treatment/Exercise - 07/22/18 1105      Exercises   Exercises  Knee/Hip      Knee/Hip Exercises: Aerobic   Recumbent Bike  L3 x 6 min      Knee/Hip Exercises: Machines for Strengthening   Cybex Knee Extension  B con/R ecc 30# x 15    Cybex Knee Flexion  B con/R ecc 35# x 15      Knee/Hip Exercises: Standing   Hip Abduction  Right;Left;15 reps;Knee bent;Stengthening    Abduction Limitations  Fitter (1 black), 2 pole A for balance    Hip Extension  Right;Left;15 reps;Knee bent;Stengthening    Extension Limitations  Fitter (1 black), 2 pole A for balance    Forward Step Up  Right;15 reps;Step Height: 8";Hand Hold: 1    Forward Step Up Limitations  + black TB TKE; cues for full TKE on R before placing L foot on step    Functional Squat  15 reps;5 seconds    Functional Squat Limitations  TRX squat - triple extension    Wall Squat  15 reps;3 seconds    Wall Squat Limitations   against orange ball on wall    Rocker Board Limitations  BOSU squat with TRX support x 11               PT Short Term Goals - 06/22/18 1030      PT SHORT TERM GOAL #1   Title  Independent with initial HEP    Status  Achieved      PT SHORT TERM GOAL #2   Title  Patient will demonstrate improved heel strike and heel-toe gait progression to promote functional R knee extension    Status  Achieved      PT SHORT TERM GOAL #3   Title  Patient will reduce R knee flexion contracture to </= 10 dg    Status  Achieved        PT Long Term Goals - 07/20/18 1005      PT LONG TERM GOAL #1   Title  Independent with advanced HEP for R knee ROM and strength    Status  Partially Met      PT LONG TERM GOAL #2   Title  Improved R knee ROM to >/= 5-120 dg to normalize gait and function.    Baseline  8-112 dg as of 07/20/18; 9-112 dg as of 06/22/18; 14-105 dg as of 06/09/18    Status  On-going      PT LONG TERM GOAL #3   Title  R hip and knee strength >/= 4+/5 for improved activity tolerance    Baseline  refer to flowsheet    Status  Partially Met      PT LONG TERM GOAL #4   Title  Patient able to ambulate community distances with normalized gait pattern    Baseline  07/20/18 - Subtle decreased R hip and knee flexion during swing phase with R knee slightly flexed in stance due to ongoing flexion contracture and mildly wider BOS    Status  Partially Met      PT LONG TERM GOAL #5   Title  Patient able to climb stairs with a symmetrical reciprocal gait pattern    Baseline  07/20/18 - Reciprocal stair negotiation but R hip hike on for R foot clearance on stair ascent and L hip drop on stair descent due to limited R eccentric quad control    Status  Partially Met  Plan - 07/22/18 1112    Clinical Impression Statement  Shelly Brown reporting increased R knee soreness and limited tolerance for JAS dynamic knee extension brace, therefore reviewed brace alignment and instructions for  adjusting tension for stretch reminding patient to keep stretch at low level, adjusting every ~10 minutes as needed to maintain gentle comfortable stretch for full 30-minute wear time. Patient noting continued limitations with balance, therefore incorporated increased balance/instability into strengthening exercises today to help promote improved balance with slight unsteadiness observed and definite need for UE support with most exercises but no LOB.    Comorbidities  h/o neck and back surgery; chronic back pain; anxiety and depression; HTN    Rehab Potential  Good    PT Treatment/Interventions  Patient/family education;Therapeutic exercise;Neuromuscular re-education;Manual techniques;Joint Manipulations;Dry needling;Passive range of motion;Taping;Splinting;Therapeutic activities;Gait training;Stair training;Electrical Stimulation;Cryotherapy;Vasopneumatic Device;Moist Heat;Iontophoresis 24m/ml Dexamethasone;ADLs/Self Care Home Management    PT Next Visit Plan  R knee ROM & LE flexibility; quad strengthening to promote knee extension; manual therapy; gait training to normalize gait pattern    PT Home Exercise Plan  HS, ITB, gastroc & hip flexor stretches; patellar mobs; prolonged low-load static stretching for knee extension    Consulted and Agree with Plan of Care  Patient       Patient will benefit from skilled therapeutic intervention in order to improve the following deficits and impairments:  Decreased range of motion, Impaired flexibility, Increased muscle spasms, Decreased strength, Increased edema, Abnormal gait, Difficulty walking, Pain, Decreased activity tolerance  Visit Diagnosis: Stiffness of right knee, not elsewhere classified  Other abnormalities of gait and mobility  Muscle weakness (generalized)  Localized edema  Chronic pain of right knee     Problem List Patient Active Problem List   Diagnosis Date Noted  . Primary osteoarthritis of right knee 06/16/2017  . Right  knee pain 11/03/2013  . Cholecystitis with cholelithiasis 07/04/2013  . Chronic cholecystitis with calculus 06/10/2013  . Healthcare maintenance 05/28/2011  . Abdominal cramping 05/28/2011  . OSA (obstructive sleep apnea)   . Anxiety and depression   . GERD (gastroesophageal reflux disease)   . Seasonal allergies   . Hx of migraine headaches 11/05/2010  . HYPERTENSION, BENIGN 10/04/2008  . Hyperlipidemia 09/29/2008  . HEADACHE 09/29/2008    JPercival Spanish PT, MPT 07/22/2018, 12:09 PM  [facility] [place] [address]Phone: [phone]  Fax:  [phone] Name: TAerilynn GoinMRN: [id number] of Birth: 2Jun 02, 1967

## 2018-07-27 ENCOUNTER — Ambulatory Visit: Payer: Medicaid Other | Admitting: Physical Therapy

## 2018-07-29 ENCOUNTER — Ambulatory Visit: Payer: Medicaid Other | Admitting: Physical Therapy

## 2018-08-03 ENCOUNTER — Ambulatory Visit: Payer: Medicaid Other | Admitting: Physical Therapy

## 2018-08-03 ENCOUNTER — Encounter: Payer: Self-pay | Admitting: Physical Therapy

## 2018-08-03 ENCOUNTER — Other Ambulatory Visit: Payer: Self-pay

## 2018-08-03 DIAGNOSIS — M25661 Stiffness of right knee, not elsewhere classified: Secondary | ICD-10-CM

## 2018-08-03 DIAGNOSIS — M6281 Muscle weakness (generalized): Secondary | ICD-10-CM

## 2018-08-03 DIAGNOSIS — G8929 Other chronic pain: Secondary | ICD-10-CM

## 2018-08-03 DIAGNOSIS — R2689 Other abnormalities of gait and mobility: Secondary | ICD-10-CM

## 2018-08-03 DIAGNOSIS — R600 Localized edema: Secondary | ICD-10-CM

## 2018-08-03 NOTE — Therapy (Signed)
[facility] [address]  [place], [place], [address] Phone: [phone]   Fax:  [phone]  Physical Therapy Treatment  Patient Details  Name: Shelly Brown MRN: [id number] Date of Birth: 05-01-1965 Referring Provider (PT): Shelly Brown. Shelly Raider, MD   Encounter Date: 08/03/2018  PT End of Session - 08/03/18 1406    Visit Number  10    Number of Visits  16    Date for PT Re-Evaluation  08/04/18    Authorization Type  Medicaid    Authorization Time Period  06/25/18 - 08/05/18    Authorization - Visit Number  6    Authorization - Number of Visits  12    PT Start Time  1406    PT Stop Time  1453    PT Time Calculation (min)  47 min    Activity Tolerance  Patient tolerated treatment well    Behavior During Therapy  [employer] for tasks assessed/performed       Past Medical History:  Diagnosis Date  . ADHD (attention deficit hyperactivity disorder)   . Anxiety   . Anxiety and depression    mainly anxiety, Dr. Wylene Brown  . Anxiety state, unspecified 08/16/2013  . Arthritis    "right knee" (06/17/2017)  . Cholelithiasis 05/2013  . Chronic lower back pain   . Depression   . GERD (gastroesophageal reflux disease)    chronic gastritis on EGD 12/2012  . History of chicken pox   . Hyperlipidemia   . Hypertension   . Insomnia   . Migraine    "stopped after neck OR in 2012" (06/17/2017)  . OSA (obstructive sleep apnea)    "can'Shelly tolerate CPAP" (06/17/2017)  . Seasonal allergies   . Spinal headache    "related to epidural for c-section & after myelogram" (06/17/2017)    Past Surgical History:  Procedure Laterality Date  . ANTERIOR CERVICAL DECOMP/DISCECTOMY FUSION  05/03/2010   for herniated disc  . BACK SURGERY    . CESAREAN SECTION  2003  . CHOLECYSTECTOMY N/A 07/04/2013   LAPAROSCOPIC CHOLECYSTECTOMY WITH INTRAOPERATIVE CHOLANGIOGRAM;  Surgeon: Shelly Roof, MD  . ESOPHAGOGASTRODUODENOSCOPY  12/2012   mild chronic gastritis,  rec dexilant Shelly Brown)  . INTRAUTERINE DEVICE (IUD) INSERTION  ~ 2014  . JOINT REPLACEMENT    . REDUCTION MAMMAPLASTY Bilateral 12/2010  . TOTAL KNEE ARTHROPLASTY Right 06/16/2017   Procedure: TOTAL KNEE ARTHROPLASTY;  Surgeon: Shelly Nakayama, MD;  Location: [facility];  Service: Orthopedics;  Laterality: Right;  . WISDOM TOOTH EXTRACTION  1998    There were no vitals filed for this visit.  Subjective Assessment - 08/03/18 1408    Subjective  Pt reporting she was unable to wear the JAS brace 3x/wk as prescribed last week while she was sick, but has been able to resume wearing schedule this week.    Pertinent History  R TKR 06/16/2017    Limitations  Walking;House hold activities    Patient Stated Goals  "to get my leg straight"    Currently in Pain?  No/denies         [facility] PT Assessment - 08/03/18 1406      AROM   Right Knee Extension  5    Right Knee Flexion  115   after joint mobs                  [facility] Adult PT Treatment/Exercise - 08/03/18 1406      Exercises  Exercises  Knee/Hip      Knee/Hip Exercises: Stretches   Knee: Self-Stretch to increase Flexion  Right;20 seconds;5 reps    Knee: Self-Stretch Limitations  step stretch      Knee/Hip Exercises: Aerobic   Recumbent Bike  L3 x 6 min      Knee/Hip Exercises: Standing   Terminal Knee Extension  Right;20 reps;Theraband;Strengthening    Theraband Level (Terminal Knee Extension)  --   Black   Functional Squat  15 reps;5 seconds    Functional Squat Limitations  counter squat - triple extension    Other Standing Knee Exercises  B side stepping with looped red TB at midfoot and fwd/back monster walk with looped red TB at ankles 2 x 40 ft      Knee/Hip Exercises: Supine   Bridges  Both;10 reps;Strengthening    Bridges Limitations  + alt knee extension    Single Leg Bridge  Right;Left;5 reps;2 sets;Strengthening   3-5" hold     Knee/Hip Exercises: Sidelying   Clams  R/L clam with green TB at knees x 15       Manual Therapy   Manual Therapy  Joint mobilization    Manual therapy comments  hooklying    Joint Mobilization  R knee - A/P mobs for increased flexion ROM             PT Education - 08/03/18 1452    Education provided  Yes    Education Details  HEP review/update    Person(s) Educated  Patient    Methods  Explanation;Demonstration;Handout    Comprehension  Verbalized understanding;Returned demonstration       PT Short Term Goals - 06/22/18 1030      PT SHORT TERM GOAL #1   Title  Independent with initial HEP    Status  Achieved      PT SHORT TERM GOAL #2   Title  Patient will demonstrate improved heel strike and heel-toe gait progression to promote functional R knee extension    Status  Achieved      PT SHORT TERM GOAL #3   Title  Patient will reduce R knee flexion contracture to </= 10 dg    Status  Achieved        PT Long Term Goals - 08/03/18 1453      PT LONG TERM GOAL #1   Title  Independent with advanced HEP for R knee ROM and strength    Status  Partially Met      PT LONG TERM GOAL #2   Title  Improved R knee ROM to >/= 5-120 dg to normalize gait and function.    Baseline  R knee: 5-115 dg as of 08/03/18; 8-112 dg as of 07/20/18; 9-112 dg as of 06/22/18; 14-105 dg as of 06/09/18    Status  Partially Met      PT LONG TERM GOAL #3   Title  R hip and knee strength >/= 4+/5 for improved activity tolerance    Baseline  refer to flowsheet    Status  Partially Met      PT LONG TERM GOAL #4   Title  Patient able to ambulate community distances with normalized gait pattern    Baseline  07/20/18 - Subtle decreased R hip and knee flexion during swing phase with R knee slightly flexed in stance due to ongoing flexion contracture and mildly wider BOS    Status  Partially Met      PT LONG TERM GOAL #  5   Title  Patient able to climb stairs with a symmetrical reciprocal gait pattern    Baseline  07/20/18 - Reciprocal stair negotiation but R hip hike on for R Brown  clearance on stair ascent and L hip drop on stair descent due to limited R eccentric quad control    Status  Partially Met            Plan - 08/03/18 1411    Clinical Impression Statement  [name] missed last week of PT due to illness and reports she was not able to keep up with HEP or wearing schedule for JAS brace while ill. Medicaid authorization period due to expire on 08/05/18 and discussion with patient reveals she thinks she would be comfortable transitioning to HEP as of last visit. As such, treatment session today focusing on review and update of HEP in preparation for discharge to HEP. Will plan for final assessment of goals on next visit with final determination of readiness for discharge vs need for recert.    Comorbidities  h/o neck and back surgery; chronic back pain; anxiety and depression; HTN    Rehab Potential  Good    PT Treatment/Interventions  Patient/family education;Therapeutic exercise;Neuromuscular re-education;Manual techniques;Joint Manipulations;Dry needling;Passive range of motion;Taping;Splinting;Therapeutic activities;Gait training;Stair training;Electrical Stimulation;Cryotherapy;Vasopneumatic Device;Moist Heat;Iontophoresis 81m/ml Dexamethasone;ADLs/Self Care Home Management    PT Next Visit Plan  Recert vs discharge as indicated    PT Home Exercise Plan  HS, ITB, gastroc & hip flexor stretches; patellar mobs; prolonged low-load static stretching for knee extension    Consulted and Agree with Plan of Care  Patient       Patient will benefit from skilled therapeutic intervention in order to improve the following deficits and impairments:  Decreased range of motion, Impaired flexibility, Increased muscle spasms, Decreased strength, Increased edema, Abnormal gait, Difficulty walking, Pain, Decreased activity tolerance  Visit Diagnosis: 1. Stiffness of right knee, not elsewhere classified   2. Other abnormalities of gait and mobility   3. Muscle weakness  (generalized)   4. Localized edema   5. Chronic pain of right knee        Problem List Patient Active Problem List   Diagnosis Date Noted  . Primary osteoarthritis of right knee 06/16/2017  . Right knee pain 11/03/2013  . Cholecystitis with cholelithiasis 07/04/2013  . Chronic cholecystitis with calculus 06/10/2013  . Healthcare maintenance 05/28/2011  . Abdominal cramping 05/28/2011  . OSA (obstructive sleep apnea)   . Anxiety and depression   . GERD (gastroesophageal reflux disease)   . Seasonal allergies   . Hx of migraine headaches 11/05/2010  . HYPERTENSION, BENIGN 10/04/2008  . Hyperlipidemia 09/29/2008  . HEADACHE 09/29/2008    JPercival Spanish PT, MPT 08/03/2018, 2:57 PM  [facility] [place] [address]Phone: [phone]  Fax:  [phone] Name: TGwendelyn LantingMRN: [id number] of Birth: 2Jan 26, 1967

## 2018-08-05 ENCOUNTER — Other Ambulatory Visit: Payer: Self-pay

## 2018-08-05 ENCOUNTER — Ambulatory Visit: Payer: Medicaid Other | Admitting: Physical Therapy

## 2018-08-05 ENCOUNTER — Encounter: Payer: Self-pay | Admitting: Physical Therapy

## 2018-08-05 DIAGNOSIS — G8929 Other chronic pain: Secondary | ICD-10-CM

## 2018-08-05 DIAGNOSIS — R2689 Other abnormalities of gait and mobility: Secondary | ICD-10-CM

## 2018-08-05 DIAGNOSIS — R600 Localized edema: Secondary | ICD-10-CM

## 2018-08-05 DIAGNOSIS — M25661 Stiffness of right knee, not elsewhere classified: Secondary | ICD-10-CM

## 2018-08-05 DIAGNOSIS — M6281 Muscle weakness (generalized): Secondary | ICD-10-CM

## 2018-08-05 NOTE — Therapy (Signed)
[facility] [address]  [place], [place], [address] Phone: [phone]   Fax:  [phone]  Physical Therapy Treatment / Discharge Summary  Patient Details  Name: Shelly Brown MRN: [id number] Date of Birth: [birth year] Referring Provider (PT): Monico Blitz. Rhona Raider, MD   Encounter Date: 08/05/2018  PT End of Session - 08/05/18 0906    Visit Number  11    Number of Visits  16    Date for PT Re-Evaluation  08/04/18    Authorization Type  Medicaid    Authorization Time Period  06/25/18 - 08/05/18    Authorization - Visit Number  7    Authorization - Number of Visits  12    PT Start Time  0906    PT Stop Time  0947    PT Time Calculation (min)  41 min    Activity Tolerance  Patient tolerated treatment well    Behavior During Therapy  [facility] for tasks assessed/performed       Past Medical History:  Diagnosis Date  . ADHD (attention deficit hyperactivity disorder)   . Anxiety   . Anxiety and depression    mainly anxiety, Dr. Wylene Simmer  . Anxiety state, unspecified 08/16/2013  . Arthritis    "right knee" (06/17/2017)  . Cholelithiasis 05/2013  . Chronic lower back pain   . Depression   . GERD (gastroesophageal reflux disease)    chronic gastritis on EGD 12/2012  . History of chicken pox   . Hyperlipidemia   . Hypertension   . Insomnia   . Migraine    "stopped after neck OR in 2012" (06/17/2017)  . OSA (obstructive sleep apnea)    "can't tolerate CPAP" (06/17/2017)  . Seasonal allergies   . Spinal headache    "related to epidural for c-section & after myelogram" (06/17/2017)    Past Surgical History:  Procedure Laterality Date  . ANTERIOR CERVICAL DECOMP/DISCECTOMY FUSION  05/03/2010   for herniated disc  . BACK SURGERY    . CESAREAN SECTION  2003  . CHOLECYSTECTOMY N/A 07/04/2013   LAPAROSCOPIC CHOLECYSTECTOMY WITH INTRAOPERATIVE CHOLANGIOGRAM;  Surgeon: Merrie Roof, MD  . ESOPHAGOGASTRODUODENOSCOPY  12/2012   mild  chronic gastritis, rec dexilant Jessie Foot)  . INTRAUTERINE DEVICE (IUD) INSERTION  ~ 2014  . JOINT REPLACEMENT    . REDUCTION MAMMAPLASTY Bilateral 12/2010  . TOTAL KNEE ARTHROPLASTY Right 06/16/2017   Procedure: TOTAL KNEE ARTHROPLASTY;  Surgeon: Melrose Nakayama, MD;  Location: [facility];  Service: Orthopedics;  Laterality: Right;  . WISDOM TOOTH EXTRACTION  1998    There were no vitals filed for this visit.  Subjective Assessment - 08/05/18 0908    Subjective  Pt feels like she is 90% better than when she started PT.    Pertinent History  R TKR 06/16/2017    Patient Stated Goals  "to get my leg straight"    Currently in Pain?  No/denies         [facility] PT Assessment - 08/05/18 0906      Assessment   Medical Diagnosis  R knee stiffness s/p R TKR    Referring Provider (PT)  Monico Blitz. Rhona Raider, MD    Onset Date/Surgical Date  06/16/17    Next MD Visit  ~July 2020      AROM   Right Knee Extension  5    Right Knee Flexion  118      Strength   Right Hip Flexion  5/5  Right Hip Extension  5/5    Right Hip ABduction  4+/5    Right Hip ADduction  4+/5    Left Hip Flexion  5/5    Left Hip Extension  5/5    Left Hip ABduction  4+/5    Left Hip ADduction  5/5    Right Knee Flexion  5/5    Right Knee Extension  5/5    Left Knee Flexion  5/5    Left Knee Extension  5/5                   [facility] Adult PT Treatment/Exercise - 08/05/18 0906      Ambulation/Gait   Ambulation Distance (Feet)  400 Feet    Assistive device  None    Gait Pattern  Within Functional Limits;Wide base of support   mildly wider BOS   Ambulation Surface  Level;Indoor    Gait velocity  WNL    Stairs Assistance  6: Modified independent (Device/Increase time)    Stair Management Technique  One rail Right;Forwards    Number of Stairs  14    Height of Stairs  7      Exercises   Exercises  Knee/Hip      Knee/Hip Exercises: Aerobic   Nustep  L6 x min - LE only      Knee/Hip Exercises: Standing    Hip Flexion  Right;15 reps;Knee straight    Hip Flexion Limitations  green TB - cues for quad set to maintain knee extension; 1 pole A    Hip ADduction  Right;15 reps;Strengthening    Hip ADduction Limitations  green TB - cues for quad set to maintain knee extension; 1 pole A    Hip Abduction  Right;15 reps;Knee straight;Stengthening    Abduction Limitations  green TB - cues for quad set to maintain knee extension; 1 pole A    Hip Extension  Right;15 reps;Knee straight;Stengthening    Extension Limitations  green TB - cues for quad set to maintain knee extension; 1 pole A               PT Short Term Goals - 06/22/18 1030      PT SHORT TERM GOAL #1   Title  Independent with initial HEP    Status  Achieved      PT SHORT TERM GOAL #2   Title  Patient will demonstrate improved heel strike and heel-toe gait progression to promote functional R knee extension    Status  Achieved      PT SHORT TERM GOAL #3   Title  Patient will reduce R knee flexion contracture to </= 10 dg    Status  Achieved        PT Long Term Goals - 08/05/18 0911      PT LONG TERM GOAL #1   Title  Independent with advanced HEP for R knee ROM and strength    Status  Achieved      PT LONG TERM GOAL #2   Title  Improved R knee ROM to >/= 5-120 dg to normalize gait and function.    Baseline  R knee: 5-118 dg as of 08/05/18; baseline: 14-105 dg as of 06/09/18    Status  Partially Met      PT LONG TERM GOAL #3   Title  R hip and knee strength >/= 4+/5 for improved activity tolerance    Baseline  refer to flowsheet    Status  Achieved  PT LONG TERM GOAL #4   Title  Patient able to ambulate community distances with normalized gait pattern    Baseline  07/20/18 - Subtle decreased R hip and knee flexion during swing phase with R knee slightly flexed in stance due to ongoing flexion contracture and mildly wider BOS    Status  Achieved      PT LONG TERM GOAL #5   Title  Patient able to climb stairs with  a symmetrical reciprocal gait pattern    Baseline  07/20/18 - Reciprocal stair negotiation but R hip hike on for R foot clearance on stair ascent and L hip drop on stair descent due to limited R eccentric quad control    Status  Achieved            Plan - 08/05/18 0910    Clinical Impression Statement  Shelly Brown very pleased with her progress noting 90% improvement since start of PT. Significant gains achieved in both flexion and extension ROM for R knee with current AROM 5-118 dg as compared to baseline of 14-105 dg on eval - patient independent with ongoing HEP stretches and use of JAS extension brace to further improve overall ROM. Overall BE LE strength now 4+/5 to 5/5 with patient also independent with ongoing strengthening HEP. Gait pattern and stair negotiation now WFL/WNL other than mild widen BOS. All goals essentially met at this time other than flexion AROM within 2 dg of goal, therefore will proceed with discharge from PT for this episode - patient in agreement with plan for discharge with transition to HEP at this time.    Comorbidities  h/o neck and back surgery; chronic back pain; anxiety and depression; HTN    Rehab Potential  Good    PT Treatment/Interventions  Patient/family education;Therapeutic exercise;Neuromuscular re-education;Manual techniques;Joint Manipulations;Dry needling;Passive range of motion;Taping;Splinting;Therapeutic activities;Gait training;Stair training;Electrical Stimulation;Cryotherapy;Vasopneumatic Device;Moist Heat;Iontophoresis 60m/ml Dexamethasone;ADLs/Self Care Home Management    PT Next Visit Plan  Discharge    PT Home Exercise Plan  HS, ITB, gastroc & hip flexor stretches; patellar mobs; prolonged low-load static stretching for knee extension    Consulted and Agree with Plan of Care  Patient       Patient will benefit from skilled therapeutic intervention in order to improve the following deficits and impairments:  Decreased range of motion, Impaired  flexibility, Increased muscle spasms, Decreased strength, Increased edema, Abnormal gait, Difficulty walking, Pain, Decreased activity tolerance  Visit Diagnosis: 1. Stiffness of right knee, not elsewhere classified   2. Other abnormalities of gait and mobility   3. Muscle weakness (generalized)   4. Localized edema   5. Chronic pain of right knee        Problem List Patient Active Problem List   Diagnosis Date Noted  . Primary osteoarthritis of right knee 06/16/2017  . Right knee pain 11/03/2013  . Cholecystitis with cholelithiasis 07/04/2013  . Chronic cholecystitis with calculus 06/10/2013  . Healthcare maintenance 05/28/2011  . Abdominal cramping 05/28/2011  . OSA (obstructive sleep apnea)   . Anxiety and depression   . GERD (gastroesophageal reflux disease)   . Seasonal allergies   . Hx of migraine headaches 11/05/2010  . HYPERTENSION, BENIGN 10/04/2008  . Hyperlipidemia 09/29/2008  . HEADACHE 09/29/2008   PHYSICAL THERAPY DISCHARGE SUMMARY  Visits from Start of Care: 11  Current functional level related to goals / functional outcomes:   Refer to above clinical impression.    Remaining deficits:   As above.   Education / Equipment:  HEP; JAS dynamic knee extension brace  Plan: Patient agrees to discharge.  Patient goals were mostly met. Patient is being discharged due to not returning since the last visit.  ?????       Percival Spanish, PT, MPT 08/05/2018, 9:56 AM  [facility] [address]  [place], [place], [address] Phone: [phone]   Fax:  [phone]  Name: Shelly Brown MRN: [id number] Date of Birth: 1965/10/23

## 2019-10-24 ENCOUNTER — Other Ambulatory Visit: Payer: Self-pay | Admitting: Orthopaedic Surgery

## 2019-10-24 ENCOUNTER — Other Ambulatory Visit (HOSPITAL_COMMUNITY): Payer: Self-pay | Admitting: Orthopaedic Surgery

## 2019-10-24 DIAGNOSIS — Z96651 Presence of right artificial knee joint: Secondary | ICD-10-CM

## 2019-10-24 DIAGNOSIS — M25561 Pain in right knee: Secondary | ICD-10-CM

## 2019-11-07 ENCOUNTER — Other Ambulatory Visit: Payer: Self-pay

## 2019-11-07 ENCOUNTER — Encounter
Admission: RE | Admit: 2019-11-07 | Discharge: 2019-11-07 | Disposition: A | Discharge status: Home / Self Care | Payer: Medicaid Other | Source: Ambulatory Visit | Attending: Orthopaedic Surgery | Admitting: Orthopaedic Surgery

## 2019-11-07 DIAGNOSIS — Z96651 Presence of right artificial knee joint: Secondary | ICD-10-CM | POA: Insufficient documentation

## 2019-11-07 DIAGNOSIS — M25561 Pain in right knee: Secondary | ICD-10-CM | POA: Diagnosis present

## 2019-11-07 MED ORDER — TECHNETIUM TC 99M MEDRONATE IV KIT
21.5600 | PACK | Freq: Once | INTRAVENOUS | Status: AC | PRN
  Administered 2019-11-07: 21.56 via INTRAVENOUS

## 2020-07-16 ENCOUNTER — Other Ambulatory Visit: Payer: Self-pay | Admitting: Internal Medicine

## 2020-07-16 DIAGNOSIS — Z1231 Encounter for screening mammogram for malignant neoplasm of breast: Secondary | ICD-10-CM

## 2020-09-13 ENCOUNTER — Ambulatory Visit
Admission: RE | Admit: 2020-09-13 | Discharge: 2020-09-13 | Disposition: A | Discharge status: Home / Self Care | Payer: Medicaid Other | Source: Ambulatory Visit | Attending: Internal Medicine | Admitting: Internal Medicine

## 2020-09-13 ENCOUNTER — Other Ambulatory Visit: Payer: Self-pay

## 2020-09-13 DIAGNOSIS — Z1231 Encounter for screening mammogram for malignant neoplasm of breast: Secondary | ICD-10-CM
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
## 2023-05-18 ENCOUNTER — Other Ambulatory Visit: Payer: Self-pay | Admitting: Internal Medicine

## 2023-05-18 DIAGNOSIS — Z1231 Encounter for screening mammogram for malignant neoplasm of breast: Secondary | ICD-10-CM

## 2023-06-10 ENCOUNTER — Ambulatory Visit

## 2023-06-10 ENCOUNTER — Ambulatory Visit
Admission: RE | Admit: 2023-06-10 | Discharge: 2023-06-10 | Disposition: A | Discharge status: Home / Self Care | Source: Ambulatory Visit | Attending: Internal Medicine | Admitting: Internal Medicine

## 2023-06-10 DIAGNOSIS — Z1231 Encounter for screening mammogram for malignant neoplasm of breast: Secondary | ICD-10-CM

## 2023-06-29 ENCOUNTER — Encounter: Payer: Self-pay | Admitting: Gastroenterology

## 2023-08-17 ENCOUNTER — Ambulatory Visit: Admitting: Gastroenterology

## 2023-10-05 ENCOUNTER — Ambulatory Visit: Admitting: Gastroenterology

## 2023-10-05 ENCOUNTER — Encounter: Payer: Self-pay | Admitting: Gastroenterology

## 2023-10-05 VITALS — BP 118/80 | HR 72 | Ht 64.0 in | Wt 215.5 lb

## 2023-10-05 DIAGNOSIS — K625 Hemorrhage of anus and rectum: Secondary | ICD-10-CM | POA: Diagnosis not present

## 2023-10-05 DIAGNOSIS — K219 Gastro-esophageal reflux disease without esophagitis: Secondary | ICD-10-CM | POA: Diagnosis not present

## 2023-10-05 DIAGNOSIS — K5909 Other constipation: Secondary | ICD-10-CM | POA: Diagnosis not present

## 2023-10-05 DIAGNOSIS — Z1211 Encounter for screening for malignant neoplasm of colon: Secondary | ICD-10-CM

## 2023-10-05 MED ORDER — NA SULFATE-K SULFATE-MG SULF 17.5-3.13-1.6 GM/177ML PO SOLN: 1.0000 | Freq: Once | ORAL | 0 refills | Status: AC

## 2023-10-05 NOTE — Progress Notes (Signed)
 Chief Complaint:blood in stool, colonoscopy Primary GI Doctor: Dr. San   HPI:  Patient is a  58  year old female patient with past medical history of anxiety, depression, and GERD, who was referred to me by Norval Kettle, MD on 05/18/23 for a evaluation of blood in stool, colonoscopy .    Interval History    Patient presents for evaluation of blood in stool and discuss colon screening colonoscopy. Patient has had intermittent BRB with wiping and in stool for at least 6 months or more. She notices it when she is more constipated. She has bowel movement daily. She denies rectal pain. Patient has a lot of abdominal bloating and discomfort with the constipation.  Patient has never had a colonoscopy.  Patient also has history of GERD and taking Omeprazole  20mg  po daily which controls her symptoms. Patient denies dysphagia.  Not on blood thinners.   Socially drinks alcohol, on weekend. Nonsmoker.   Surgical history: gallbladder removed  Patient's family history includes: mother with lung CA  Wt Readings from Last 3 Encounters:  10/05/23 215 lb 8 oz (97.8 kg)  06/16/17 207 lb (93.9 kg)  06/04/17 207 lb (93.9 kg)     Past Medical History:  Diagnosis Date   ADHD (attention deficit hyperactivity disorder)    Anxiety    Anxiety and depression    mainly anxiety, Dr. Mitch   Anxiety state, unspecified 08/16/2013   Arthritis    right knee (06/17/2017)   Cholelithiasis 05/2013   Chronic lower back pain    Depression    GERD (gastroesophageal reflux disease)    chronic gastritis on EGD 12/2012   History of chicken pox    Hyperlipidemia    Hypertension    Insomnia    Migraine    stopped after neck OR in 2012 (06/17/2017)   OSA (obstructive sleep apnea)    can't tolerate CPAP (06/17/2017)   Seasonal allergies    Spinal headache    related to epidural for c-section & after myelogram (06/17/2017)    Past Surgical History:  Procedure Laterality Date   ANTERIOR CERVICAL  DECOMP/DISCECTOMY FUSION  05/03/2010   for herniated disc   BACK SURGERY     CESAREAN SECTION  2003   CHOLECYSTECTOMY N/A 07/04/2013   LAPAROSCOPIC CHOLECYSTECTOMY WITH INTRAOPERATIVE CHOLANGIOGRAM;  Surgeon: Deward GORMAN Null III, MD   ESOPHAGOGASTRODUODENOSCOPY  12/2012   mild chronic gastritis, rec dexilant (Ganem Eagle)   INTRAUTERINE DEVICE (IUD) INSERTION  ~ 2014   JOINT REPLACEMENT     REDUCTION MAMMAPLASTY Bilateral 12/2010   REPLACEMENT TOTAL KNEE  2019   TOTAL KNEE ARTHROPLASTY Right 06/16/2017   Procedure: TOTAL KNEE ARTHROPLASTY;  Surgeon: Sheril Coy, MD;  Location: [facility];  Service: Orthopedics;  Laterality: Right;   WISDOM TOOTH EXTRACTION  1998    Current Outpatient Medications  Medication Sig Dispense Refill   amphetamine-dextroamphetamine (ADDERALL) 30 MG tablet Take 1 tablet by mouth 2 (two) times daily.     aspirin -acetaminophen -caffeine  (EXCEDRIN  MIGRAINE) 250-250-65 MG tablet Take 2 tablets by mouth daily as needed for migraine.     buPROPion  (WELLBUTRIN  XL) 150 MG 24 hr tablet Take 150 mg by mouth daily.     Cholecalciferol (D 1000) 25 MCG (1000 UT) capsule Take 1,000 Units by mouth.     clonazePAM  (KLONOPIN ) 1 MG tablet Take 1 mg by mouth at bedtime.     hydrochlorothiazide  (HYDRODIURIL ) 25 MG tablet Take 25 mg by mouth daily.     lisdexamfetamine (VYVANSE ) 60 MG  capsule Take 60 mg by mouth every morning.     metoprolol  tartrate (LOPRESSOR ) 50 MG tablet Take 50 mg by mouth 2 (two) times daily.     Na Sulfate-K Sulfate-Mg Sulfate concentrate (SUPREP) 17.5-3.13-1.6 GM/177ML SOLN Take 1 kit (354 mLs total) by mouth once for 1 dose. 354 mL 0   omeprazole  (PRILOSEC) 20 MG capsule Take 20 mg by mouth daily.     QUEtiapine  (SEROQUEL ) 25 MG tablet Take 25 mg by mouth at bedtime.     rosuvastatin (CRESTOR) 10 MG tablet Take 10 mg by mouth at bedtime.     Simethicone  (GAS-X PO) Take 2 tablets by mouth daily as needed (gas).     No current facility-administered medications  for this visit.    Allergies as of 10/05/2023 - Review Complete 10/05/2023  Allergen Reaction Noted   Morphine  and codeine Other (See Comments) 11/27/2010    Family History  Problem Relation Age of Onset   Coronary artery disease Paternal Grandfather 25       MI   Heart attack Paternal Grandfather    Arthritis Father    Coronary artery disease Father 6       MI   Heart attack Father    Arthritis Mother    Lung cancer Mother    Arthritis Maternal Grandfather    Arthritis Maternal Grandmother    Migraines Maternal Grandmother    Migraines Sister    Coronary artery disease Paternal Uncle    Stroke Sister    Diabetes Neg Hx    Cancer Neg Hx     Review of Systems:    Constitutional: No weight loss, fever, chills, weakness or fatigue HEENT: Eyes: No change in vision               Ears, Nose, Throat:  No change in hearing or congestion Skin: No rash or itching Cardiovascular: No chest pain, chest pressure or palpitations   Respiratory: No SOB or cough Gastrointestinal: See HPI and otherwise negative Genitourinary: No dysuria or change in urinary frequency Neurological: No headache, dizziness or syncope Musculoskeletal: No new muscle or joint pain Hematologic: No bleeding or bruising Psychiatric: No history of depression or anxiety    Physical Exam:  Vital signs: BP 118/80   Pulse 72   Ht 5' 4 (1.626 m)   Wt 215 lb 8 oz (97.8 kg)   BMI 36.99 kg/m   Constitutional:   Pleasant  female appears to be in NAD, Well developed, Well nourished, alert and cooperative Throat: Oral cavity and pharynx without inflammation, swelling or lesion.  Respiratory: Respirations even and unlabored. Lungs clear to auscultation bilaterally.   No wheezes, crackles, or rhonchi.  Cardiovascular: Normal S1, S2. Regular rate and rhythm. No peripheral edema, cyanosis or pallor.  Gastrointestinal:  Soft, nondistended, nontender. No rebound or guarding. Normal bowel sounds. No appreciable masses or  hepatomegaly. Rectal:  Not performed.  Msk:  Symmetrical without gross deformities. Without edema, no deformity or joint abnormality.  Neurologic:  Alert and  oriented x4;  grossly normal neurologically.  Skin:   Dry and intact without significant lesions or rashes.  RELEVANT LABS AND IMAGING: CBC    Latest Ref Rng & Units 06/04/2017   11:00 AM 05/24/2014    4:20 PM 07/03/2013    7:28 PM  CBC  WBC 4.0 - 10.5 K/uL 8.5  6.7  8.3   Hemoglobin 12.0 - 15.0 g/dL 86.6  86.2  85.1   Hematocrit 36.0 - 46.0 % 40.7  41.1  44.6   Platelets 150 - 400 K/uL 282  296  291      CMP     Latest Ref Rng & Units 06/04/2017   11:00 AM 05/24/2014    4:20 PM 07/03/2013    7:28 PM  CMP  Glucose 65 - 99 mg/dL 97  884  862   BUN 6 - 20 mg/dL 9  15  8    Creatinine 0.44 - 1.00 mg/dL 9.23  9.33  9.23   Sodium 135 - 145 mmol/L 135  138  138   Potassium 3.5 - 5.1 mmol/L 3.3  3.5  3.9   Chloride 101 - 111 mmol/L 98  103  97   CO2 22 - 32 mmol/L 26  26  28    Calcium 8.9 - 10.3 mg/dL 9.3  9.2  89.8   Total Protein 6.0 - 8.3 g/dL   8.0   Total Bilirubin 0.3 - 1.2 mg/dL   1.1   Alkaline Phos 39 - 117 U/L   104   AST 0 - 37 U/L   18   ALT 0 - 35 U/L   13      Lab Results  Component Value Date   TSH 1.08 05/21/2011  12/2012 EGD at [facility] Normal esophagus Zline 36 cm from the incisors Erythematous mucosa in the stomach, biopsied Normal examined duodenum Path: Surgical [P], antrum, biopsy - SLIGHT CHRONIC GASTRITIS. NO HELICOBACTER PYLORI, DYSPLASIA OR EVIDENCE OF MALIGNANCY IDENTIFIED. Microscopic Comment A Warthin-Starry stain is performed to determine the possibility of the presence of Helicobacter pylori. The Warthin-Starry stain is negative for organisms of Helicobacter pylori.   Assessment: Encounter Diagnoses  Name Primary?   Special screening for malignant neoplasms, colon Yes   Rectal bleeding    Chronic constipation    Gastroesophageal reflux disease, unspecified whether esophagitis  present      58 year old female patient who presents for colon screening colonoscopy with reports of intermittent episodes of bright red blood with wiping and in stool.  Patient does admit to chronic constipation and straining.  Will start patient on daily MiraLAX along with fiber supplementation.  Will go ahead and schedule colon Screening colonoscopy in [place] with Dr. San to evaluate.    Patient also has GERD that is managed as long as she takes daily omeprazole  and follows GERD diet.  No changes made  Plan: -Recommend high fiber diet -OTC Citrucel 1 tsp po daily -OTC Miralax po daily  -Recommend GERD diet -Continue omeprazole  20 mg po daily -Schedule for a colonoscopy with 2 day prep with Dr. San. The risks and benefits of colonoscopy with possible polypectomy / biopsies were discussed and the patient agrees to proceed.     Thank you for the courtesy of this consult. Please call me with any questions or concerns.   Starr Engel, FNP-C [facility] 10/05/2023, 10:16 AM  Cc: Norval Kettle, MD

## 2023-10-05 NOTE — Patient Instructions (Addendum)
 Constipation Recommend high fiber diet No straining May benefit from squatty potty OTC Citrucel 1 tsp po daily OTC Miralax po daily   GERD diet Recommend GERD diet Continue omeprazole  20 mg po daily  You have been scheduled for a colonoscopy. Please follow written instructions given to you at your visit today.   If you use inhalers (even only as needed), please bring them with you on the day of your procedure.  DO NOT TAKE 7 DAYS PRIOR TO TEST- Trulicity (dulaglutide) Ozempic, Wegovy (semaglutide) Mounjaro (tirzepatide) Bydureon Bcise (exanatide extended release)  DO NOT TAKE 1 DAY PRIOR TO YOUR TEST Rybelsus (semaglutide) Adlyxin (lixisenatide) Victoza (liraglutide) Byetta (exanatide) ___________________________________________________________________________  _______________________________________________________  If your blood pressure at your visit was 140/90 or greater, please contact your primary care physician to follow up on this.  _______________________________________________________  If you are age 17 or older, your body mass index should be between 23-30. Your Body mass index is 36.99 kg/m. If this is out of the aforementioned range listed, please consider follow up with your Primary Care Provider.  If you are age 67 or younger, your body mass index should be between 19-25. Your Body mass index is 36.99 kg/m. If this is out of the aformentioned range listed, please consider follow up with your Primary Care Provider.   ________________________________________________________  The [facility] providers would like to encourage you to use MYCHART to communicate with providers for non-urgent requests or questions.  Due to long hold times on the telephone, sending your provider a message by [facility] may be a faster and more efficient way to get a response.  Please allow 48 business hours for a response.  Please remember that this is for non-urgent requests.   _______________________________________________________  [facility] is using a team-based approach to care.  Your team is made up of your doctor and two to three APPS. Our APPS (Nurse Practitioners and Physician Assistants) work with your physician to ensure care continuity for you. They are fully qualified to address your health concerns and develop a treatment plan. They communicate directly with your gastroenterologist to care for you. Seeing the Advanced Practice Practitioners on your physician's team can help you by facilitating care more promptly, often allowing for earlier appointments, access to diagnostic testing, procedures, and other specialty referrals.

## 2023-11-02 ENCOUNTER — Encounter: Admitting: Gastroenterology

## 2023-11-23 ENCOUNTER — Ambulatory Visit: Admitting: Gastroenterology

## 2023-11-23 ENCOUNTER — Encounter: Payer: Self-pay | Admitting: Gastroenterology

## 2023-11-23 VITALS — BP 155/93 | HR 73 | Temp 98.1°F | Resp 28 | Ht 64.0 in | Wt 215.0 lb

## 2023-11-23 DIAGNOSIS — K625 Hemorrhage of anus and rectum: Secondary | ICD-10-CM

## 2023-11-23 DIAGNOSIS — K641 Second degree hemorrhoids: Secondary | ICD-10-CM | POA: Diagnosis not present

## 2023-11-23 DIAGNOSIS — D125 Benign neoplasm of sigmoid colon: Secondary | ICD-10-CM | POA: Diagnosis not present

## 2023-11-23 DIAGNOSIS — Z1211 Encounter for screening for malignant neoplasm of colon: Secondary | ICD-10-CM

## 2023-11-23 DIAGNOSIS — K635 Polyp of colon: Secondary | ICD-10-CM

## 2023-11-23 DIAGNOSIS — K644 Residual hemorrhoidal skin tags: Secondary | ICD-10-CM

## 2023-11-23 MED ORDER — SODIUM CHLORIDE 0.9 % IV SOLN: 500.0000 mL | Freq: Once | INTRAVENOUS | Status: DC

## 2023-11-23 NOTE — Progress Notes (Signed)
 GASTROENTEROLOGY PROCEDURE H&P NOTE   Primary Care Physician: Norval Kettle, MD    Reason for Procedure:  Colon Cancer screening  Plan:    Colonoscopy  Patient is appropriate for endoscopic procedure(s) in the ambulatory ([facility]) setting.  The nature of the procedure, as well as the risks, benefits, and alternatives were carefully and thoroughly reviewed with the patient. Ample time for discussion and questions allowed. The patient understood, was satisfied, and agreed to proceed.     HPI: Shelly Brown is a 58 y.o. female who presents for colonoscopy for routine Colon Cancer screening.    She does have intermittent BRB on tissue paper and in stool for the last 6+ months or so.  Worse when she has constipation.  No dyschezia.  No prior colonoscopy.  No known family history of colon cancer.  Was seen in the GI clinic on 10/05/2023.  Started on MiraLAX daily for constipation and scheduled for colonoscopy.  Past Medical History:  Diagnosis Date   ADHD (attention deficit hyperactivity disorder)    Anxiety    Anxiety and depression    mainly anxiety, Dr. Mitch   Anxiety state, unspecified 08/16/2013   Arthritis    right knee (06/17/2017)   Cholelithiasis 05/2013   Chronic lower back pain    Depression    GERD (gastroesophageal reflux disease)    chronic gastritis on EGD 12/2012   History of chicken pox    Hyperlipidemia    Hypertension    Insomnia    Migraine    stopped after neck OR in 2012 (06/17/2017)   OSA (obstructive sleep apnea)    can't tolerate CPAP (06/17/2017)   Seasonal allergies    Spinal headache    related to epidural for c-section & after myelogram (06/17/2017)    Past Surgical History:  Procedure Laterality Date   ANTERIOR CERVICAL DECOMP/DISCECTOMY FUSION  05/03/2010   for herniated disc   BACK SURGERY     CESAREAN SECTION  2003   CHOLECYSTECTOMY N/A 07/04/2013   LAPAROSCOPIC CHOLECYSTECTOMY WITH INTRAOPERATIVE CHOLANGIOGRAM;  Surgeon: Deward GORMAN Null  III, MD   ESOPHAGOGASTRODUODENOSCOPY  12/2012   mild chronic gastritis, rec dexilant (Ganem Eagle)   INTRAUTERINE DEVICE (IUD) INSERTION  ~ 2014   JOINT REPLACEMENT     REDUCTION MAMMAPLASTY Bilateral 12/2010   REPLACEMENT TOTAL KNEE  2019   TOTAL KNEE ARTHROPLASTY Right 06/16/2017   Procedure: TOTAL KNEE ARTHROPLASTY;  Surgeon: Sheril Coy, MD;  Location: [facility] OR;  Service: Orthopedics;  Laterality: Right;   WISDOM TOOTH EXTRACTION  1998    Prior to Admission medications   Medication Sig Start Date End Date Taking? Authorizing Provider  Na Sulfate-K Sulfate-Mg Sulfate concentrate (SUPREP) 17.5-3.13-1.6 GM/177ML SOLN  10/05/23  Yes [provider]  QUEtiapine  (SEROQUEL ) 50 MG tablet Take by mouth. 08/07/23  Yes [provider]  amphetamine-dextroamphetamine (ADDERALL) 30 MG tablet Take 1 tablet by mouth 2 (two) times daily. 05/16/23   [provider]  aspirin -acetaminophen -caffeine  (EXCEDRIN  MIGRAINE) 250-250-65 MG tablet Take 2 tablets by mouth daily as needed for migraine.    [provider]  buPROPion  (WELLBUTRIN  XL) 150 MG 24 hr tablet Take 150 mg by mouth daily.    [provider]  Cholecalciferol (D 1000) 25 MCG (1000 UT) capsule Take 1,000 Units by mouth. 02/22/18   [provider]  clonazePAM  (KLONOPIN ) 1 MG tablet Take 1 mg by mouth at bedtime.    [provider]  hydrochlorothiazide  (HYDRODIURIL ) 25 MG tablet Take 25 mg by mouth  daily.    [provider]  lisdexamfetamine (VYVANSE ) 60 MG capsule Take 60 mg by mouth every morning.    [provider]  metoprolol  tartrate (LOPRESSOR ) 50 MG tablet Take 50 mg by mouth 2 (two) times daily.    [provider]  omeprazole  (PRILOSEC) 20 MG capsule Take 20 mg by mouth daily.    [provider]  QUEtiapine  (SEROQUEL ) 25 MG tablet Take 25 mg by mouth at bedtime.    [provider]  rosuvastatin (CRESTOR) 10 MG tablet Take 10 mg by mouth at  bedtime. 05/15/23   [provider]  Simethicone  (GAS-X PO) Take 2 tablets by mouth daily as needed (gas).    [provider]    Current Outpatient Medications  Medication Sig Dispense Refill   Na Sulfate-K Sulfate-Mg Sulfate concentrate (SUPREP) 17.5-3.13-1.6 GM/177ML SOLN      QUEtiapine  (SEROQUEL ) 50 MG tablet Take by mouth.     amphetamine-dextroamphetamine (ADDERALL) 30 MG tablet Take 1 tablet by mouth 2 (two) times daily.     aspirin -acetaminophen -caffeine  (EXCEDRIN  MIGRAINE) 250-250-65 MG tablet Take 2 tablets by mouth daily as needed for migraine.     buPROPion  (WELLBUTRIN  XL) 150 MG 24 hr tablet Take 150 mg by mouth daily.     Cholecalciferol (D 1000) 25 MCG (1000 UT) capsule Take 1,000 Units by mouth.     clonazePAM  (KLONOPIN ) 1 MG tablet Take 1 mg by mouth at bedtime.     hydrochlorothiazide  (HYDRODIURIL ) 25 MG tablet Take 25 mg by mouth daily.     lisdexamfetamine (VYVANSE ) 60 MG capsule Take 60 mg by mouth every morning.     metoprolol  tartrate (LOPRESSOR ) 50 MG tablet Take 50 mg by mouth 2 (two) times daily.     omeprazole  (PRILOSEC) 20 MG capsule Take 20 mg by mouth daily.     QUEtiapine  (SEROQUEL ) 25 MG tablet Take 25 mg by mouth at bedtime.     rosuvastatin (CRESTOR) 10 MG tablet Take 10 mg by mouth at bedtime.     Simethicone  (GAS-X PO) Take 2 tablets by mouth daily as needed (gas).     Current Facility-Administered Medications  Medication Dose Route Frequency Provider Last Rate Last Admin   0.9 %  sodium chloride  infusion  500 mL Intravenous Once Janica Eldred V, DO        Allergies as of 11/23/2023 - Review Complete 11/23/2023  Allergen Reaction Noted   Morphine  and codeine Other (See Comments) 11/27/2010    Family History  Problem Relation Age of Onset   Arthritis Mother    Lung cancer Mother    Arthritis Father    Coronary artery disease Father 80       MI   Heart attack Father    Migraines Sister    Stroke Sister    Coronary artery  disease Paternal Uncle    Arthritis Maternal Grandmother    Migraines Maternal Grandmother    Arthritis Maternal Grandfather    Coronary artery disease Paternal Grandfather 108       MI   Heart attack Paternal Grandfather    Diabetes Neg Hx    Cancer Neg Hx    Colon cancer Neg Hx    Esophageal cancer Neg Hx    Stomach cancer Neg Hx    Rectal cancer Neg Hx     Social History   Socioeconomic History   Marital status: Divorced    Spouse name: Not on file   Number of children: 1   Years  of education: Not on file   Highest education level: Not on file  Occupational History   Occupation: Stay at home mom    Employer: UNEMPLOYED  Tobacco Use   Smoking status: Never   Smokeless tobacco: Never  Vaping Use   Vaping status: Never Used  Substance and Sexual Activity   Alcohol use: Yes    Alcohol/week: 6.0 standard drinks of alcohol    Types: 4 Glasses of wine, 2 Cans of beer per week   Drug use: No   Sexual activity: Yes    Partners: Male    Birth control/protection: I.U.D.  Other Topics Concern   Not on file  Social History Narrative   Caffeine : 2-3 cups   Lives with husband and daughter (2003), 1 dog   Occupation: stay at home mom, currently unemployed, unemployment runs out in Dec   Activity: no regular activity, wants to start   Diet: good fruits and vegetables, good water.     Social Drivers of Corporate investment banker Strain: Not on file  Food Insecurity: Not on file  Transportation Needs: Not on file  Physical Activity: Not on file  Stress: Not on file  Social Connections: Not on file  Intimate Partner Violence: Not on file    Physical Exam: Vital signs in last 24 hours: @BP  133/89   Pulse 74   Temp 98.1 F (36.7 C) (Skin)   Ht 5' 4 (1.626 m)   Wt 215 lb (97.5 kg)   SpO2 98%   BMI 36.90 kg/m  GEN: NAD EYE: Sclerae anicteric ENT: MMM CV: Non-tachycardic Pulm: CTA b/l GI: Soft, NT/ND NEURO:  Alert & Oriented x 3   Sandor Flatter, DO [facility]   11/23/2023 2:24 PM

## 2023-11-23 NOTE — Op Note (Signed)
 [facility] Patient Name: Shelly Brown Procedure Date: 11/23/2023 2:05 PM MRN: [id number] Endoscopist: Sandor Flatter , MD, [id number] Age: 58 Referring MD:  Date of Birth: [birth year] Gender: Female Account #: [id number] Procedure:                Colonoscopy Indications:              Screening for colorectal malignant neoplasm, This                            is the patient's first colonoscopy Medicines:                Monitored Anesthesia Care Procedure:                Pre-Anesthesia Assessment:                           - Prior to the procedure, a History and Physical                            was performed, and patient medications and                            allergies were reviewed. The patient's tolerance of                            previous anesthesia was also reviewed. The risks                            and benefits of the procedure and the sedation                            options and risks were discussed with the patient.                            All questions were answered, and informed consent                            was obtained. Prior Anticoagulants: The patient has                            taken no anticoagulant or antiplatelet agents. ASA                            Grade Assessment: II - A patient with mild systemic                            disease. After reviewing the risks and benefits,                            the patient was deemed in satisfactory condition to                            undergo the procedure.  After obtaining informed consent, the colonoscope                            was passed under direct vision. Throughout the                            procedure, the patient's blood pressure, pulse, and                            oxygen saturations were monitored continuously. The                            CF [id number] #[id number] was introduced through the anus                            and advanced to the  the cecum, identified by                            appendiceal orifice and ileocecal valve. The                            colonoscopy was performed without difficulty. The                            patient tolerated the procedure well. The quality                            of the bowel preparation was good. The ileocecal                            valve, appendiceal orifice, and rectum were                            photographed. Scope In: 2:39:54 PM Scope Out: 2:51:26 PM Scope Withdrawal Time: 0 hours 9 minutes 51 seconds  Total Procedure Duration: 0 hours 11 minutes 32 seconds  Findings:                 Hemorrhoids and skin tags were found on perianal                            exam.                           An 8 mm polyp was found in the sigmoid colon. The                            polyp was sessile. The polyp was removed with a                            cold snare. Resection and retrieval were complete.                            Estimated blood loss was minimal.  A 5 mm polyp was found in the sigmoid colon. The                            polyp was semi-sessile. The polyp was removed with                            a cold snare. Resection and retrieval were                            complete. Estimated blood loss was minimal.                           The exam was otherwise normal throughout the                            remainder of the colon.                           Non-bleeding internal hemorrhoids were found during                            retroflexion. The hemorrhoids were medium-sized and                            Grade II (internal hemorrhoids that prolapse but                            reduce spontaneously). Complications:            No immediate complications. Estimated Blood Loss:     Estimated blood loss was minimal. Impression:               - Hemorrhoids found on perianal exam.                           - One 8 mm polyp in the  sigmoid colon, removed with                            a cold snare. Resected and retrieved.                           - One 5 mm polyp in the sigmoid colon, removed with                            a cold snare. Resected and retrieved.                           - Non-bleeding internal hemorrhoids. Recommendation:           - Patient has a contact number available for                            emergencies. The signs and symptoms of potential  delayed complications were discussed with the                            patient. Return to normal activities tomorrow.                            Written discharge instructions were provided to the                            patient.                           - Resume previous diet.                           - Continue present medications.                           - Await pathology results.                           - Repeat colonoscopy for surveillance based on                            pathology results.                           - Return to GI clinic PRN.                           - Internal hemorrhoids were noted on this study and                            may be amenable to hemorrhoid band ligation. If you                            are interested in further treatment of these                            hemorrhoids with band ligation, please contact my                            clinic to set up an appointment for evaluation and                            treatment. Sandor Flatter, MD 11/23/2023 2:56:22 PM

## 2023-11-23 NOTE — Progress Notes (Signed)
 VS by CL  Pt's states no medical or surgical changes since previsit or office visit.

## 2023-11-23 NOTE — Patient Instructions (Signed)
 Handouts given: Polyps, Hemorrhoids Resume previous diet. Continue present medications.  Await pathology results. Repeat colonoscopy for surveillance based on pathology results.    YOU HAD AN ENDOSCOPIC PROCEDURE TODAY AT THE [facility]:   Refer to the procedure report that was given to you for any specific questions about what was found during the examination.  If the procedure report does not answer your questions, please call your gastroenterologist to clarify.  If you requested that your care partner not be given the details of your procedure findings, then the procedure report has been included in a sealed envelope for you to review at your convenience later.  YOU SHOULD EXPECT: Some feelings of bloating in the abdomen. Passage of more gas than usual.  Walking can help get rid of the air that was put into your GI tract during the procedure and reduce the bloating. If you had a lower endoscopy (such as a colonoscopy or flexible sigmoidoscopy) you may notice spotting of blood in your stool or on the toilet paper. If you underwent a bowel prep for your procedure, you may not have a normal bowel movement for a few days.  Please Note:  You might notice some irritation and congestion in your nose or some drainage.  This is from the oxygen used during your procedure.  There is no need for concern and it should clear up in a day or so.  SYMPTOMS TO REPORT IMMEDIATELY:  Following lower endoscopy (colonoscopy or flexible sigmoidoscopy):  Excessive amounts of blood in the stool  Significant tenderness or worsening of abdominal pains  Swelling of the abdomen that is new, acute  Fever of 100F or higher  For urgent or emergent issues, a gastroenterologist can be reached at any hour by calling [phone]. Do not use MyChart messaging for urgent concerns.    DIET:  We do recommend a small meal at first, but then you may proceed to your regular diet.  Drink plenty of fluids but you  should avoid alcoholic beverages for 24 hours.  ACTIVITY:  You should plan to take it easy for the rest of today and you should NOT DRIVE or use heavy machinery until tomorrow (because of the sedation medicines used during the test).    FOLLOW UP: Our staff will call the number listed on your records the next business day following your procedure.  We will call around 7:15- 8:00 am to check on you and address any questions or concerns that you may have regarding the information given to you following your procedure. If we do not reach you, we will leave a message.     If any biopsies were taken you will be contacted by phone or by letter within the next 1-3 weeks.  Please call us  at [phone] if you have not heard about the biopsies in 3 weeks.    SIGNATURES/CONFIDENTIALITY: You and/or your care partner have signed paperwork which will be entered into your electronic medical record.  These signatures attest to the fact that that the information above on your After Visit Summary has been reviewed and is understood.  Full responsibility of the confidentiality of this discharge information lies with you and/or your care-partner.

## 2023-11-23 NOTE — Progress Notes (Signed)
 Called to room to assist during endoscopic procedure.  Patient ID and intended procedure confirmed with present staff. Received instructions for my participation in the procedure from the performing physician.

## 2023-11-23 NOTE — Progress Notes (Signed)
 Sedate, gd SR, tolerated procedure well, VSS, report to RN

## 2023-11-24 ENCOUNTER — Telehealth: Payer: Self-pay

## 2023-11-24 NOTE — Telephone Encounter (Signed)
  Follow up Call-     11/23/2023    2:20 PM  Call back number  Post procedure Call Back phone  # [phone]  Permission to leave phone message Yes     Patient questions:  Do you have a fever, pain , or abdominal swelling? No. Pain Score  0 *  Have you tolerated food without any problems? Yes.    Have you been able to return to your normal activities? Yes.    Do you have any questions about your discharge instructions: Diet   No. Medications  No. Follow up visit  No.  Do you have questions or concerns about your Care? No.  Actions: * If pain score is 4 or above: No action needed, pain <4.

## 2023-11-26 LAB — SURGICAL PATHOLOGY

## 2023-12-02 ENCOUNTER — Ambulatory Visit: Payer: Self-pay | Admitting: Gastroenterology
